# Patient Record
Sex: Female | Born: 1980 | Race: White | Hispanic: No | Smoking: Current every day smoker
Health system: Southern US, Community
[De-identification: ages and names within clinical notes are randomized; demographics above are authoritative.]

## PROBLEM LIST (undated history)

## (undated) DIAGNOSIS — T1491XA Suicide attempt, initial encounter: Secondary | ICD-10-CM

## (undated) DIAGNOSIS — M797 Fibromyalgia: Secondary | ICD-10-CM

## (undated) DIAGNOSIS — C801 Malignant (primary) neoplasm, unspecified: Secondary | ICD-10-CM

## (undated) DIAGNOSIS — R51 Headache: Secondary | ICD-10-CM

## (undated) DIAGNOSIS — D649 Anemia, unspecified: Secondary | ICD-10-CM

## (undated) DIAGNOSIS — F329 Major depressive disorder, single episode, unspecified: Secondary | ICD-10-CM

## (undated) DIAGNOSIS — F419 Anxiety disorder, unspecified: Secondary | ICD-10-CM

## (undated) DIAGNOSIS — G8929 Other chronic pain: Secondary | ICD-10-CM

## (undated) DIAGNOSIS — Z87442 Personal history of urinary calculi: Secondary | ICD-10-CM

## (undated) DIAGNOSIS — F32A Depression, unspecified: Secondary | ICD-10-CM

## (undated) DIAGNOSIS — R102 Pelvic and perineal pain: Secondary | ICD-10-CM

## (undated) DIAGNOSIS — M549 Dorsalgia, unspecified: Secondary | ICD-10-CM

## (undated) DIAGNOSIS — N3010 Interstitial cystitis (chronic) without hematuria: Secondary | ICD-10-CM

## (undated) DIAGNOSIS — N83209 Unspecified ovarian cyst, unspecified side: Secondary | ICD-10-CM

## (undated) HISTORY — PX: ABDOMINAL HYSTERECTOMY: SHX81

## (undated) HISTORY — PX: HERNIA REPAIR: SHX51

## (undated) HISTORY — DX: Depression, unspecified: F32.A

## (undated) HISTORY — DX: Interstitial cystitis (chronic) without hematuria: N30.10

## (undated) HISTORY — DX: Major depressive disorder, single episode, unspecified: F32.9

## (undated) HISTORY — DX: Anxiety disorder, unspecified: F41.9

---
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
## 2004-02-02 ENCOUNTER — Emergency Department (HOSPITAL_COMMUNITY): Admission: EM | Admit: 2004-02-02 | Discharge: 2004-02-03 | Payer: Self-pay | Admitting: Emergency Medicine

## 2004-10-16 ENCOUNTER — Emergency Department (HOSPITAL_COMMUNITY): Admission: EM | Admit: 2004-10-16 | Discharge: 2004-10-16 | Payer: Self-pay | Admitting: Emergency Medicine

## 2005-04-11 ENCOUNTER — Emergency Department (HOSPITAL_COMMUNITY): Admission: AD | Admit: 2005-04-11 | Discharge: 2005-04-11 | Payer: Self-pay | Admitting: Family Medicine

## 2005-07-01 ENCOUNTER — Ambulatory Visit: Payer: Self-pay | Admitting: Internal Medicine

## 2005-07-06 ENCOUNTER — Ambulatory Visit (HOSPITAL_COMMUNITY): Admission: RE | Admit: 2005-07-06 | Discharge: 2005-07-06 | Payer: Self-pay | Admitting: Internal Medicine

## 2005-07-29 ENCOUNTER — Emergency Department (HOSPITAL_COMMUNITY): Admission: EM | Admit: 2005-07-29 | Discharge: 2005-07-29 | Payer: Self-pay | Admitting: Family Medicine

## 2005-08-02 ENCOUNTER — Emergency Department (HOSPITAL_COMMUNITY): Admission: EM | Admit: 2005-08-02 | Discharge: 2005-08-02 | Payer: Self-pay | Admitting: Emergency Medicine

## 2005-08-10 ENCOUNTER — Encounter (INDEPENDENT_AMBULATORY_CARE_PROVIDER_SITE_OTHER): Payer: Self-pay | Admitting: *Deleted

## 2005-08-10 ENCOUNTER — Ambulatory Visit: Payer: Self-pay | Admitting: Obstetrics & Gynecology

## 2005-09-08 ENCOUNTER — Ambulatory Visit: Payer: Self-pay | Admitting: Obstetrics & Gynecology

## 2005-09-10 ENCOUNTER — Ambulatory Visit (HOSPITAL_COMMUNITY): Admission: RE | Admit: 2005-09-10 | Discharge: 2005-09-10 | Payer: Self-pay | Admitting: Obstetrics and Gynecology

## 2005-09-17 ENCOUNTER — Emergency Department (HOSPITAL_COMMUNITY): Admission: EM | Admit: 2005-09-17 | Discharge: 2005-09-17 | Payer: Self-pay | Admitting: Emergency Medicine

## 2005-09-17 ENCOUNTER — Ambulatory Visit: Payer: Self-pay | Admitting: Gynecology

## 2005-09-22 ENCOUNTER — Ambulatory Visit: Payer: Self-pay | Admitting: Gynecology

## 2005-10-07 ENCOUNTER — Encounter (INDEPENDENT_AMBULATORY_CARE_PROVIDER_SITE_OTHER): Payer: Self-pay | Admitting: Specialist

## 2005-10-07 ENCOUNTER — Emergency Department (HOSPITAL_COMMUNITY): Admission: EM | Admit: 2005-10-07 | Discharge: 2005-10-07 | Payer: Self-pay | Admitting: Emergency Medicine

## 2005-10-07 ENCOUNTER — Other Ambulatory Visit: Admission: RE | Admit: 2005-10-07 | Discharge: 2005-10-07 | Payer: Self-pay | Admitting: Obstetrics and Gynecology

## 2005-10-07 ENCOUNTER — Ambulatory Visit: Payer: Self-pay | Admitting: Obstetrics and Gynecology

## 2005-10-12 ENCOUNTER — Ambulatory Visit (HOSPITAL_COMMUNITY): Admission: RE | Admit: 2005-10-12 | Discharge: 2005-10-12 | Payer: Self-pay | Admitting: Obstetrics & Gynecology

## 2005-10-12 ENCOUNTER — Ambulatory Visit: Payer: Self-pay | Admitting: Obstetrics & Gynecology

## 2005-10-19 ENCOUNTER — Inpatient Hospital Stay (HOSPITAL_COMMUNITY): Admission: AD | Admit: 2005-10-19 | Discharge: 2005-10-19 | Payer: Self-pay | Admitting: Gynecology

## 2005-10-27 ENCOUNTER — Ambulatory Visit: Payer: Self-pay | Admitting: Obstetrics & Gynecology

## 2005-11-24 ENCOUNTER — Ambulatory Visit: Payer: Self-pay | Admitting: Obstetrics & Gynecology

## 2005-12-09 ENCOUNTER — Ambulatory Visit: Payer: Self-pay | Admitting: Obstetrics and Gynecology

## 2005-12-22 ENCOUNTER — Ambulatory Visit: Payer: Self-pay | Admitting: Obstetrics & Gynecology

## 2006-02-28 ENCOUNTER — Emergency Department (HOSPITAL_COMMUNITY): Admission: EM | Admit: 2006-02-28 | Discharge: 2006-02-28 | Payer: Self-pay | Admitting: Emergency Medicine

## 2006-03-23 ENCOUNTER — Encounter: Payer: Self-pay | Admitting: Obstetrics & Gynecology

## 2006-03-23 ENCOUNTER — Ambulatory Visit: Payer: Self-pay | Admitting: Obstetrics & Gynecology

## 2006-05-17 ENCOUNTER — Encounter (INDEPENDENT_AMBULATORY_CARE_PROVIDER_SITE_OTHER): Payer: Self-pay | Admitting: *Deleted

## 2006-05-17 ENCOUNTER — Ambulatory Visit: Payer: Self-pay | Admitting: Obstetrics & Gynecology

## 2006-05-17 ENCOUNTER — Inpatient Hospital Stay (HOSPITAL_COMMUNITY): Admission: RE | Admit: 2006-05-17 | Discharge: 2006-05-18 | Payer: Self-pay | Admitting: Obstetrics & Gynecology

## 2006-10-13 ENCOUNTER — Emergency Department (HOSPITAL_COMMUNITY): Admission: EM | Admit: 2006-10-13 | Discharge: 2006-10-13 | Payer: Self-pay | Admitting: Family Medicine

## 2006-10-19 ENCOUNTER — Ambulatory Visit: Payer: Self-pay | Admitting: Family Medicine

## 2007-01-22 ENCOUNTER — Emergency Department (HOSPITAL_COMMUNITY): Admission: EM | Admit: 2007-01-22 | Discharge: 2007-01-22 | Payer: Self-pay | Admitting: Family Medicine

## 2007-02-21 ENCOUNTER — Ambulatory Visit: Payer: Self-pay | Admitting: Internal Medicine

## 2007-02-21 DIAGNOSIS — F810 Specific reading disorder: Secondary | ICD-10-CM

## 2007-02-21 DIAGNOSIS — F411 Generalized anxiety disorder: Secondary | ICD-10-CM | POA: Insufficient documentation

## 2007-02-21 DIAGNOSIS — F329 Major depressive disorder, single episode, unspecified: Secondary | ICD-10-CM

## 2007-02-21 LAB — CONVERTED CEMR LAB
Eosinophils Absolute: 0 10*3/uL (ref 0.0–0.7)
Lymphocytes Relative: 34 % (ref 12–46)
Neutro Abs: 3.6 10*3/uL (ref 1.7–7.7)
Platelets: 195 10*3/uL (ref 150–400)
RBC: 4.88 M/uL (ref 3.87–5.11)
RDW: 12.5 % (ref 11.5–14.0)
WBC: 6 10*3/uL (ref 4.0–10.5)

## 2008-01-17 ENCOUNTER — Emergency Department (HOSPITAL_COMMUNITY): Admission: EM | Admit: 2008-01-17 | Discharge: 2008-01-18 | Payer: Self-pay | Admitting: Emergency Medicine

## 2008-06-24 ENCOUNTER — Encounter: Admission: RE | Admit: 2008-06-24 | Discharge: 2008-06-24 | Payer: Self-pay | Admitting: Family Medicine

## 2008-07-15 ENCOUNTER — Inpatient Hospital Stay (HOSPITAL_COMMUNITY): Admission: EM | Admit: 2008-07-15 | Discharge: 2008-07-17 | Payer: Self-pay | Admitting: Emergency Medicine

## 2008-07-17 ENCOUNTER — Inpatient Hospital Stay (HOSPITAL_COMMUNITY): Admission: AD | Admit: 2008-07-17 | Discharge: 2008-07-18 | Payer: Self-pay | Admitting: *Deleted

## 2008-07-17 ENCOUNTER — Ambulatory Visit: Payer: Self-pay | Admitting: *Deleted

## 2009-02-02 ENCOUNTER — Emergency Department (HOSPITAL_COMMUNITY): Admission: EM | Admit: 2009-02-02 | Discharge: 2009-02-02 | Payer: Self-pay | Admitting: Emergency Medicine

## 2009-04-25 ENCOUNTER — Emergency Department (HOSPITAL_COMMUNITY): Admission: EM | Admit: 2009-04-25 | Discharge: 2009-04-26 | Payer: Self-pay | Admitting: Emergency Medicine

## 2009-05-08 ENCOUNTER — Emergency Department (HOSPITAL_COMMUNITY): Admission: EM | Admit: 2009-05-08 | Discharge: 2009-05-08 | Payer: Self-pay | Admitting: Family Medicine

## 2009-07-01 ENCOUNTER — Emergency Department (HOSPITAL_COMMUNITY): Admission: EM | Admit: 2009-07-01 | Discharge: 2009-07-02 | Payer: Self-pay | Admitting: Emergency Medicine

## 2009-08-26 ENCOUNTER — Emergency Department (HOSPITAL_COMMUNITY): Admission: EM | Admit: 2009-08-26 | Discharge: 2009-08-26 | Payer: Self-pay | Admitting: Family Medicine

## 2009-09-29 ENCOUNTER — Emergency Department (HOSPITAL_COMMUNITY): Admission: EM | Admit: 2009-09-29 | Discharge: 2009-09-30 | Payer: Self-pay | Admitting: Emergency Medicine

## 2010-03-18 ENCOUNTER — Emergency Department (HOSPITAL_COMMUNITY): Admission: EM | Admit: 2010-03-18 | Discharge: 2010-03-18 | Payer: Self-pay | Admitting: Emergency Medicine

## 2010-09-13 LAB — BASIC METABOLIC PANEL
Calcium: 8.8 mg/dL (ref 8.4–10.5)
Creatinine, Ser: 0.6 mg/dL (ref 0.4–1.2)
GFR calc Af Amer: >60 mL/min (ref >60)
Potassium: 3.6 mEq/L (ref 3.5–5.1)

## 2010-09-13 LAB — POCT PREGNANCY, URINE: Preg Test, Ur: NEGATIVE

## 2010-09-13 LAB — URINALYSIS, ROUTINE W REFLEX MICROSCOPIC
Bilirubin Urine: NEGATIVE
Nitrite: NEGATIVE
Protein, ur: NEGATIVE mg/dL
Urobilinogen, UA: 0.2 mg/dL (ref 0.0–1.0)
pH: 7.5 (ref 5.0–8.0)

## 2010-09-13 LAB — DIFFERENTIAL
Eosinophils Relative: 1 % (ref 0–5)
Lymphocytes Relative: 29 % (ref 12–46)
Lymphs Abs: 1.5 10*3/uL (ref 0.7–4.0)
Monocytes Relative: 7 % (ref 3–12)

## 2010-09-13 LAB — CBC
MCHC: 33.8 g/dL (ref 30.0–36.0)
MCV: 92.3 fL (ref 78.0–100.0)
Platelets: 155 10*3/uL (ref 150–400)
RDW: 13.2 % (ref 11.5–15.5)

## 2010-10-03 LAB — POCT URINALYSIS DIP (DEVICE)
Bilirubin Urine: NEGATIVE
Glucose, UA: NEGATIVE mg/dL
Hgb urine dipstick: NEGATIVE
Ketones, ur: NEGATIVE mg/dL
Specific Gravity, Urine: 1.015 (ref 1.005–1.030)

## 2010-10-03 LAB — COMPREHENSIVE METABOLIC PANEL
AST: 20 U/L (ref 0–37)
Albumin: 4 g/dL (ref 3.5–5.2)
Alkaline Phosphatase: 46 U/L (ref 39–117)
CO2: 25 mEq/L (ref 19–32)
Chloride: 104 mEq/L (ref 96–112)
Creatinine, Ser: 0.63 mg/dL (ref 0.4–1.2)
GFR calc Af Amer: >60 mL/min (ref >60)
GFR calc non Af Amer: >60 mL/min (ref >60)
Potassium: 3.8 mEq/L (ref 3.5–5.1)
Total Bilirubin: 1 mg/dL (ref 0.3–1.2)

## 2010-10-03 LAB — CBC
HCT: 42 % (ref 36.0–46.0)
MCV: 90.5 fL (ref 78.0–100.0)
Platelets: 161 10*3/uL (ref 150–400)
RBC: 4.65 MIL/uL (ref 3.87–5.11)
WBC: 7 10*3/uL (ref 4.0–10.5)

## 2010-10-12 LAB — COMPREHENSIVE METABOLIC PANEL
ALT: 13 U/L (ref 0–35)
AST: 15 U/L (ref 0–37)
AST: 16 U/L (ref 0–37)
Albumin: 2.8 g/dL — ABNORMAL LOW (ref 3.5–5.2)
Albumin: 3.9 g/dL (ref 3.5–5.2)
Alkaline Phosphatase: 38 U/L — ABNORMAL LOW (ref 39–117)
Alkaline Phosphatase: 40 U/L (ref 39–117)
BUN: 4 mg/dL — ABNORMAL LOW (ref 6–23)
CO2: 23 mEq/L (ref 19–32)
CO2: 24 mEq/L (ref 19–32)
Calcium: 8.2 mg/dL — ABNORMAL LOW (ref 8.4–10.5)
Chloride: 106 mEq/L (ref 96–112)
Chloride: 109 mEq/L (ref 96–112)
Chloride: 114 mEq/L — ABNORMAL HIGH (ref 96–112)
Creatinine, Ser: 0.62 mg/dL (ref 0.4–1.2)
Creatinine, Ser: 0.68 mg/dL (ref 0.4–1.2)
GFR calc Af Amer: >60 mL/min (ref >60)
GFR calc Af Amer: >60 mL/min (ref >60)
GFR calc Af Amer: >60 mL/min (ref >60)
GFR calc non Af Amer: >60 mL/min (ref >60)
GFR calc non Af Amer: >60 mL/min (ref >60)
Glucose, Bld: 87 mg/dL (ref 70–99)
Glucose, Bld: 91 mg/dL (ref 70–99)
Potassium: 3.4 mEq/L — ABNORMAL LOW (ref 3.5–5.1)
Potassium: 3.5 mEq/L (ref 3.5–5.1)
Potassium: 4.3 mEq/L (ref 3.5–5.1)
Sodium: 138 mEq/L (ref 135–145)
Sodium: 139 mEq/L (ref 135–145)
Sodium: 140 mEq/L (ref 135–145)
Total Bilirubin: 0.7 mg/dL (ref 0.3–1.2)
Total Bilirubin: 0.7 mg/dL (ref 0.3–1.2)
Total Bilirubin: 0.8 mg/dL (ref 0.3–1.2)
Total Protein: 4.5 g/dL — ABNORMAL LOW (ref 6.0–8.3)
Total Protein: 4.8 g/dL — ABNORMAL LOW (ref 6.0–8.3)
Total Protein: 5.1 g/dL — ABNORMAL LOW (ref 6.0–8.3)
Total Protein: 6 g/dL (ref 6.0–8.3)

## 2010-10-12 LAB — CBC
HCT: 36 % (ref 36.0–46.0)
HCT: 41.6 % (ref 36.0–46.0)
Hemoglobin: 11.9 g/dL — ABNORMAL LOW (ref 12.0–15.0)
Hemoglobin: 12.1 g/dL (ref 12.0–15.0)
Hemoglobin: 14.1 g/dL (ref 12.0–15.0)
MCHC: 33.7 g/dL (ref 30.0–36.0)
MCHC: 33.9 g/dL (ref 30.0–36.0)
MCV: 91.6 fL (ref 78.0–100.0)
MCV: 92.6 fL (ref 78.0–100.0)
Platelets: 132 10*3/uL — ABNORMAL LOW (ref 150–400)
Platelets: 167 10*3/uL (ref 150–400)
RBC: 3.82 MIL/uL — ABNORMAL LOW (ref 3.87–5.11)
RBC: 3.93 MIL/uL (ref 3.87–5.11)
RBC: 4.54 MIL/uL (ref 3.87–5.11)
RDW: 13.1 % (ref 11.5–15.5)
RDW: 13.1 % (ref 11.5–15.5)
RDW: 13.3 % (ref 11.5–15.5)
WBC: 4 10*3/uL (ref 4.0–10.5)
WBC: 4.6 10*3/uL (ref 4.0–10.5)
WBC: 6 10*3/uL (ref 4.0–10.5)

## 2010-10-12 LAB — DIFFERENTIAL
Basophils Absolute: 0 10*3/uL (ref 0.0–0.1)
Basophils Absolute: 0 10*3/uL (ref 0.0–0.1)
Basophils Relative: 0 % (ref 0–1)
Basophils Relative: 1 % (ref 0–1)
Basophils Relative: 1 % (ref 0–1)
Eosinophils Absolute: 0.1 10*3/uL (ref 0.0–0.7)
Eosinophils Absolute: 0.1 K/uL (ref 0.0–0.7)
Eosinophils Relative: 1 % (ref 0–5)
Eosinophils Relative: 1 % (ref 0–5)
Eosinophils Relative: 1 % (ref 0–5)
Lymphocytes Relative: 34 % (ref 12–46)
Lymphs Abs: 1.6 10*3/uL (ref 0.7–4.0)
Lymphs Abs: 2 10*3/uL (ref 0.7–4.0)
Monocytes Absolute: 0.3 10*3/uL (ref 0.1–1.0)
Monocytes Absolute: 0.3 10*3/uL (ref 0.1–1.0)
Monocytes Absolute: 0.4 10*3/uL (ref 0.1–1.0)
Monocytes Relative: 6 % (ref 3–12)
Monocytes Relative: 6 % (ref 3–12)
Monocytes Relative: 8 % (ref 3–12)
Neutro Abs: 2.6 10*3/uL (ref 1.7–7.7)
Neutro Abs: 3.5 10*3/uL (ref 1.7–7.7)
Neutrophils Relative %: 51 % (ref 43–77)
Neutrophils Relative %: 59 % (ref 43–77)

## 2010-10-12 LAB — RAPID URINE DRUG SCREEN, HOSP PERFORMED
Amphetamines: NOT DETECTED
Barbiturates: NOT DETECTED
Benzodiazepines: NOT DETECTED
Cocaine: NOT DETECTED
Opiates: NOT DETECTED
Tetrahydrocannabinol: POSITIVE — AB

## 2010-10-12 LAB — SALICYLATE LEVEL: Salicylate Lvl: <4 mg/dL (ref 2.8–20.0)

## 2010-10-12 LAB — COMPREHENSIVE METABOLIC PANEL WITH GFR
ALT: 11 U/L (ref 0–35)
Alkaline Phosphatase: 46 U/L (ref 39–117)
BUN: 10 mg/dL (ref 6–23)
CO2: 27 meq/L (ref 19–32)
Calcium: 9.2 mg/dL (ref 8.4–10.5)
GFR calc non Af Amer: >60 mL/min (ref >60)
Glucose, Bld: 106 mg/dL — ABNORMAL HIGH (ref 70–99)
Sodium: 139 meq/L (ref 135–145)

## 2010-10-12 LAB — URINALYSIS, ROUTINE W REFLEX MICROSCOPIC
Nitrite: NEGATIVE
Specific Gravity, Urine: 1.002 — ABNORMAL LOW (ref 1.005–1.030)
Urobilinogen, UA: 1 mg/dL (ref 0.0–1.0)
pH: 7 (ref 5.0–8.0)

## 2010-10-12 LAB — ETHANOL: Alcohol, Ethyl (B): <5 mg/dL (ref 0–10)

## 2010-10-12 LAB — MAGNESIUM: Magnesium: 2 mg/dL (ref 1.5–2.5)

## 2010-10-12 LAB — PREGNANCY, URINE: Preg Test, Ur: NEGATIVE

## 2010-10-12 LAB — ACETAMINOPHEN LEVEL: Acetaminophen (Tylenol), Serum: <10 ug/mL — ABNORMAL LOW (ref 10–30)
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
## 2010-11-10 NOTE — Consult Note (Signed)
Susan Gross, Susan Gross              ACCOUNT NO.:  [id number]   MEDICAL RECORD NO.:  [id number]          PATIENT TYPE:  INP   LOCATION:  0199                         FACILITY:  [facility]   PHYSICIAN:  Antonietta Breach, M.D.  DATE OF BIRTH:  22-Jun-1981   DATE OF CONSULTATION:  07/16/2008  DATE OF DISCHARGE:                                 CONSULTATION   ADDENDUM   Will defer an antipsychotic at this time.  However, if she continues  with the hallucinations, she will require antipsychotic therapy.   Would be cautious about any nausea or sedation with the trazodone.   Also, the patient is experiencing some Cymbalta withdrawal with muscle  aches and unusual feeling on edge.  Will therefore start a low dosage of  Cymbalta 20 mg daily to be tapered off or potentially continued with  augmentation strategy once she reaches the psychiatric ward.      Antonietta Breach, M.D.  Electronically Signed     JW/MEDQ  D:  07/16/2008  T:  07/16/2008  Job:  [id number]

## 2010-11-10 NOTE — H&P (Signed)
NAMEBRITANIA, Gross              ACCOUNT NO.:  [id number]   MEDICAL RECORD NO.:  [id number]          PATIENT TYPE:  INP   LOCATION:  0101                         FACILITY:  [facility]   PHYSICIAN:  Manus Gunning, MD      DATE OF BIRTH:  1981/02/27   DATE OF ADMISSION:  07/15/2008  DATE OF DISCHARGE:                              HISTORY & PHYSICAL   CHIEF COMPLAINT:  Suicide attempt with benzodiazepines; dose and amount  unknown.   HISTORY OF PRESENT ILLNESS:  Ms. Susan Gross is a 30 year old Caucasian  female who supposedly was in an argument with her husband and decided to  overdose on benzodiazepines in an attempt to commit suicide.  Apparently, she has done this in the past per the ED physician signing  out.  The patient herself is somnolent and very difficult to arouse  during my interview.  She is hemodynamically stable with borderline  blood pressures in the 90s to the low 100s.  She is saturating greater  than 92% on 2 liters nasal cannula at the time of my interview.  No  history was able to be obtained from the patient, and there is no family  member at her bedside.  Therefore, history has essentially been obtained  from the ED physician signing out, as well as searching through past  records on the computer.  While she was in the ED, she obtained  approximately half a dose of charcoal, as she vomited the other half  out.  She took her medications approximately around midnight and  presented to the emergency department around 1 o'clock in the morning.   PAST MEDICAL AND PAST SURGICAL HISTORY:  1. Endometriosis.  2. Status post hysterectomy.  3. Suicidal attempts, multiple.  4. Fibromyalgia.  5. Nephrolithiasis.  6. Herniorrhaphy.   ALLERGIES:  1. CIPRO.  2. VICODIN.   SOCIAL HISTORY:  No history of alcohol use.  Positive  tetrahydrocannabinols on urine drug screen.  Positive tobacco use.   HOME MEDICATIONS:  1. Lyrica.  2. Clonazepam.  3. Elmiron.  (Doses of  medications and frequency unknown.)   REVIEW OF SYSTEMS:  A 14-point review of systems was attempted but  unfortunately cannot be obtained secondary to patient's presenting  condition.   PHYSICAL EXAMINATION:  VITAL SIGNS:  The vitals at the time of  presentation revealed a temperature of 98.5, heart rate 73, respiratory  rate 16, blood pressure 114/71, O2 saturation 100% on room air.  GENERAL:  Well-nourished, well-developed Caucasian female who was asleep  on the bed in no apparent distress.  HEENT:  Normocephalic and atraumatic.  Positive gag reflex.  Moist oral  mucosa, no thrush.  Eyes anicteric.  Extraocular muscles cannot be  assessed.  Pupils equal and reactive to light and accommodation.  CARDIOVASCULAR:  S1, S2 normal.  Regular rate and rhythm.  No murmurs,  rubs, or gallops.  RESPIRATORY:  Air entry  bilaterally equal.  No rales, rhonchi or  wheezes appreciated.  ABDOMEN:  Soft, nontender, nondistended.  Positive bowel sounds.  No  hepatomegaly.  EXTREMITIES:  No cyanosis, no clubbing, no edema.  Positive  bilateral  dorsalis pedis.  CNS:  Alert and oriented.  Cranial nerves II-XII grossly intact.  Deep  tendon reflexes are bilaterally symmetrical.  HEM/ONC: No palpable lymphadenopathy.  No ecchymosis or petechiae.  No  hepatosplenomegaly.  SKIN:  No breakdown, swelling ulcerations or masses.  NECK:  Supple.  Good range of motion.  No thyromegaly.  No carotid  bruits.   LABORATORY TESTS:  WBC 6000, hemoglobin 14.1, hematocrit 41.6, platelet  count 167, polymorphs 59.  Sodium 139, potassium 3.4, chloride 106, CO2  of 27, glucose 106, BUN 10, creatinine 0.6, total bilirubin 0.7,  alkaline phosphatase 46, AST 16, ALT 11, total protein 6, albumin 3.9,  calcium 9.2.  Betadine hCG negative.  Acetaminophen less than 10.  Salicylate less than 4.  Tetrahydrocannabinols positive.  Benzodiazepines - none detected.  Urinalysis negative for protein,  ketones, blood, bilirubin,  glucose, nitrate or leukocyte esterase.   ASSESSMENT AND PLAN:  Suicide attempt.  At this time, medication  unknown; supposedly took benzodiazepines and clonazepam based on her  home medication list per ED sign out, but urine drug screen is negative.  She received activated charcoal in the emergency department and is  currently hemodynamically stable.  Therefore, will closely monitor,  obtain a.m. labs, CBC, differential and BNP and reassess in a.m.  Place  on telemetry with close monitoring.  Continue nasal cannula to keep O2  saturation greater than 90%.  Prophylaxis with Lovenox 40 mg  subcutaneous daily and Protonix 40 mg p.o. daily for GI and DVT  prophylaxis and Tylenol 650 mg q.4h. p.r.n. mild to moderate pain.  Will  need a psychiatric consult in the morning and also place with one-on-one  sitter.      Manus Gunning, MD  Electronically Signed     SP/MEDQ  D:  07/15/2008  T:  07/15/2008  Job:  [phone]

## 2010-11-10 NOTE — Discharge Summary (Signed)
Susan Gross, Susan Gross              ACCOUNT NO.:  [id number]   MEDICAL RECORD NO.:  [id number]          PATIENT TYPE:  INP   LOCATION:  0199                         FACILITY:  [facility]   PHYSICIAN:  Theodosia Paling, MD    DATE OF BIRTH:  [birth year]   DATE OF ADMISSION:  07/15/2008  DATE OF DISCHARGE:  07/17/2008                               DISCHARGE SUMMARY   PRIMARY CARE PHYSICIAN:  Patient does not have a primary care physician  at this time.   ADMITTING HISTORY:  Please refer to the excellent admission note  dictated by Dr. Manus Gunning and the history of present illness as  dictated on July 15, 2008.   DISCHARGE DIAGNOSES:  1. Drug overdose.  2. Suicide attempt secondary to depression.  3. Fibromyalgia.   DISCHARGE MEDICATIONS:  1. Trazodone 25 mg p.o. nightly p.r.n.  2. Cymbalta 20 grams p.o. daily.  3. Ibuprofen 800 mg p.o. q.8 hours for 4 days.  4. Lyrica 25 mg p.o. q.12 h.  5. Protonix 40 mg p.o. q.12 h.   HOSPITAL COURSE:  The following issues were addressed during the  hospitalization:  1. Benzodiazepine overdose.  Patient was sedated at the time of      evaluation.  She did not have any cardiorespiratory adverse effects      from benzodiazepine overdose.  2. Depression and suicidal ideation.  Dr. Antonietta Breach was      consulted for further evaluation for accepted the patient to      behavioral medicine for further management and under behavioral      medicine.  Highly appreciate his help in this regard.  3. Fibromyalgia.  Cymbalta was continued along with Lyrica.   DISPOSITION:  Patient is getting transferred to [facility] for  further evaluation and management.   Total time spent in discharge of this patient, transfer of this patient:  Forty minutes.      Theodosia Paling, MD  Electronically Signed     NP/MEDQ  D:  07/17/2008  T:  07/17/2008  Job:  [phone]
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
## 2010-11-13 NOTE — Group Therapy Note (Signed)
NAME:  Susan, Gross NO.:  [id number]   MEDICAL RECORD NO.:  [id number]          PATIENT TYPE:  WOC   LOCATION:  [facility]                   FACILITY:  [facility]   PHYSICIAN:  Elsie Lincoln, MD      DATE OF BIRTH:  12/18/1980   DATE OF SERVICE:                                    CLINIC NOTE   Patient is a 30 year old female, G3, P2-0-1-2, last menstrual period is many  months ago as the patient has a Mirena IUD in place, which can cause  amenorrhea.  The patient was here for multiple complaints mostly involving  pelvic pain, back pain, dyspareunia and lots of problems with urination.  The patient has incontinence when she coughs or sneezes and she also has  incontinence when she has an urge to void, and when she does void only  little dribbles come out and she still has a sense that her bladder is not  empty.  She urinates approximately 30 times a day.  This pain in her pelvis  has been going on since 2004 ever since she had a STAT C-section for her  second child.  The pain is better when she is supine and worse with  movement, urination and sex.  She is also having some new sharp pains to the  perineum, and she also problems with her legs going numb.   PAST MEDICAL HISTORY:  Depression.   PAST GYNECOLOGICAL HISTORY:  NSVD x1, C-section x1, miscarriage with a  subsequent D&C x1.  The patient has a Mirena IUD in place since 2004.  No  history of sexually transmitted diseases and no abnormal Pap smears.   PAST SURGICAL HISTORY:  Hernia repair.   SOCIAL HISTORY:  One-half pack per day smoker, no drugs or alcohol.  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  She is married in a  monogamous relationship.   FAMILY HISTORY:  No history of familial cancers.  Dad died at age 47 from  acute myocardial infarction.   REVIEW OF SYMPTOMS:  Positive in most symptoms and most of them are GU.  She  also has problems with weight loss and fatigue.   PHYSICAL  EXAMINATION:  VITAL SIGNS:  Temperature 99, pulse 78, blood  pressure 122/72 and weight 96.6.  GENERAL:  A thin, slightly depressed appearing female in no apparent  distress.  ABDOMEN:  Soft, nontender and nondistended.  No rebound, no guarding and no  hernias.  GENITALIA:  Tanner V.  Wet Q-tip test on the vulva revealed no pain at the  introitus or on the perineum.  Vulvodynia not a problem.  Urethra mildly  tender.  Bladder feels enlarged and tender.  Vagina pink.  Normal rugae.  Cervix closed.  Uterus tender to deep palpation.  Adnexa, no masses, but  just diffusely tender.  Again, most of the pain seems to be anterior over  the bladder.   ASSESSMENT/PLAN:  A 30 year old female with chronic pelvic pain and overflow  incontinence and inability to void.  1.  Post void residual revealed 500 mL.  A UA, microscopy and a urine  culture sent.  2.  Referral made to Bjorn Pippin, M.D.  The patient has an appointment on      February 15 at 1:00.  3.  Pap smear done.  4.  Return to clinic in 4 weeks after urological complaints are addressed      and at that point, we will investigate more etiologies of chronic pelvic      pain.           ______________________________  Elsie Lincoln, MD     KL/MEDQ  D:  08/10/2005  T:  08/10/2005  Job:  [id number]

## 2010-11-13 NOTE — Group Therapy Note (Signed)
NAME:  Susan Gross, FUJIKAWA NO.:  [id number]   MEDICAL RECORD NO.:  [id number]          PATIENT TYPE:  WOC   LOCATION:  [facility]                   FACILITY:  [facility]   PHYSICIAN:  Elsie Lincoln, MD      DATE OF BIRTH:  1980/08/11   DATE OF SERVICE:                                    CLINIC NOTE   The patient is a 30 year old female, who is here for followup of pelvic  pain.  She was seen by the urologist recently and he thinks that I may have  blocked any stone in the urethra that was causing her overflow incontinence.  The patient has been worse with pain and has been taking her mother's  Demerol, and she really wants something done for this pain.  She has 2 small  ovarian cysts noted in an early January ultrasound.  So, we are going to  repeat those today, ordered that today.  She also is very skinny.  She is  less than 100 pounds and I am somewhat worried about anorexia, but I will  draw a TSH today.  Also today, she further tells [place] that she has been  committed to [facility] for suicidal ideation and also that she has been  on multiple meds for depression, but however, she has recently been on  Klonopin and Xanax.  So, I am slightly confused about what her true mental  problems are.  She denies suicidal ideation today.  Also of note, she has an  ASCUS Pap with a high-risk HPV and is scheduled for a colposcopy October 07, 2005.   PHYSICAL EXAMINATION:  VITAL SIGNS:  Temperature 99.8, pulse 104, blood  pressure 121/95, weight 97, height 5 feet 4 inches.  GENERAL:  Frail, visibly upset.  ABDOMEN:  Soft, mildly tender in lower quadrants, no rebound and no  guarding.  No tenderness of her bladder, no fullness over her bladder and  urethra nontender.  GENITALIA:  Tanner V.  Cervix slightly tender, pain to deep palpation on the  left, greater than on the right and no masses felt.   ASSESSMENT/PLAN:  A 30 year old female with chronic pelvic pain with  resolved  urological overflow incontinence.  1.  Demerol for pain.  The patient needs a urine drug screen next visit to      make sure that she is actually taking these pills.  2.  Ultrasound scheduled for followup ovarian cyst.  3.  Behavioral health referral.  4.  Schedule for diagnostic laparoscopy.           ______________________________  Elsie Lincoln, MD     KL/MEDQ  D:  09/08/2005  T:  09/09/2005  Job:  [id number]

## 2010-11-13 NOTE — Group Therapy Note (Signed)
NAME:  Susan Gross, Susan Gross NO.:  [id number]   MEDICAL RECORD NO.:  [id number]          PATIENT TYPE:  WOC   LOCATION:  [facility]                   FACILITY:  [facility]   PHYSICIAN:  Argentina Donovan, MD        DATE OF BIRTH:  1981-05-18   DATE OF SERVICE:  12/09/2005                                    CLINIC NOTE   HISTORY OF PRESENT ILLNESS:  The patient is a 30 year old white female who  has been followed by Dr. Penne Lash with laparoscopy, etc., for chronic pelvic  pain.  She was found to have endometriosis, although mild.  She was treated  with Lupron in May of this year, 11.25.  She has gotten really no relief  from that.  She has an appointment in mid-July with Dr. Genevive Bi at [facility].  She  has been evaluated by a urologist, with no significant findings and has been  taking Ultram for mild pain and Percocet when it got really bad.  She has  been unable to have any sexual activity in over a year.  She has an IUD  which Dr. Penne Lash wanted removed.  We removed the patient's IUD.  We will  renew her Percocet prescription and hope that she gets some relief after  seeing Dr. Genevive Bi.   IMPRESSION:  1.  Chronic pelvic pain.  2.  Endometriosis.           ______________________________  Argentina Donovan, MD     PR/MEDQ  D:  12/09/2005  T:  12/09/2005  Job:  [id number]

## 2010-11-13 NOTE — Group Therapy Note (Signed)
NAME:  Susan Gross, Susan Gross NO.:  [id number]   MEDICAL RECORD NO.:  [id number]          PATIENT TYPE:  WOC   [facility]ATION[facility]                   [facility]   PHYSICIAN:  Elsie Lincoln, MD      DATE OF BIRTH:  12-Jul-1980   DATE OF SERVICE:                                    CLINIC NOTE   HISTORY OF PRESENT ILLNESS:  The patient is a 30 year old female who is here  for a two week follow up for surgery.  The patient had diagnostic  laparoscopy two weeks ago.  She was found to have mild endometriosis on her  uterosacral ligaments. There are photographs under the hospital tab of this  chart.  The patient states she is doing better today.  She was started on  continuous OCT's.  She was also taking Ultram and Percocet for pain p.r.n.  Her incisions are well-healed.  I did cut out a small knot at the  __________her umbilical incision.  Other good news is that she has decided  to quit smoking.  We started her on Chantix to start this 1/2 mg p.o. days  #1-#3, then 1/2 mg p.o. b.i.d. day #4-#7, then 1 mg p.o. b.i.d. for up to 12  weeks.  The patient is to start the medication a week before quits smoking.  I refilled her Ultram and Percocet, and the patient is to come back in 12  weeks and see how she is doing with the pelvic pain and her smoking  cessation.  She is also a candidate for Lupron, and if this does not work,  we could send her to [facility].           ______________________________  Elsie Lincoln, MD     KL/MEDQ  D:  10/27/2005  T:  10/28/2005  Job:  [id number]

## 2010-11-13 NOTE — Group Therapy Note (Signed)
NAME:  Susan Gross, Susan Gross NO.:  [id number]   MEDICAL RECORD NO.:  [id number]          PATIENT TYPE:  WOC   [facility]ATION[facility]                   [facility]   PHYSICIAN:  Ginger Carne, MD DATE OF BIRTH:  22-Mar-1981   DATE OF SERVICE:                                    CLINIC NOTE   This patient is a 30 year old Caucasian female returning today complaining  of fever.  She states she has been on antibiotics for a urinary tract  infection.  However, it is unclear what she has been taking in the past.  Said medication has not yet been called in by this office.  She states that  on March 21 she had a temperature of 102.3 and an elevation on March 22 of  100.3.  She had been on Demerol for pelvic pain.   LABORATORY:  Urinalysis demonstrates leukocytes.  Urine drug screen is  pending.   IMPRESSION:  Urinary tract infection.   PLAN:  The patient declined Cipro as previously recommended by Dr. Penne Lash  because she states it causes nausea and declined Pyridium .  She states she  has an over the counter medication for urinary burning.  She was, therefore,  prescribed Macrobid 100 mg twice a day with food for 10 days.  She is  scheduled for a diagnostic laparoscopy for pelvic pain by Dr. Penne Lash on  March 17 and a colonoscopy on March 14 as well, but the patient had  requested to have more pain medication and I advised to the patient that the  best course at this time would be for her to utilized ibuprofen to be taken  as needed and that following her surgery further recommendations based on  pathology will be made available.           ______________________________  Ginger Carne, MD     SHB/MEDQ  D:  09/17/2005  T:  09/18/2005  Job:  [id number]

## 2010-11-13 NOTE — Group Therapy Note (Signed)
NAME:  Susan, Gross NO.:  [id number]   MEDICAL RECORD NO.:  [id number]          PATIENT TYPE:  WOC   [facility]ATION[facility]                   [facility]   PHYSICIAN:  Elsie Lincoln, MD      DATE OF BIRTH:  [birth year]   DATE OF SERVICE:                                    CLINIC NOTE   The patient is a 30 year old female with chronic pelvic pain who has been  seen at [facility] several times.  They believe she has got mostly  musculoskeletal in nature.  She has been to physical therapy, which has not  helped.  Dr. __________ finally saw the patient and believed that it would  help from a laparoscopic uterine suspension and excision of cul-de-sac  endometriosis.  However, she does not want this procedure, she just wants a  simple hysterectomy without BSO.  She would rather have it done here in  [place] where she knows me, and not at [facility] where she does not  know her doctors.  I agreed that I would do this.  She is sure she does not  want any more children, and I do believe her.  She does have mild  endometriosis in the cul-de-sac, a little bit on the uterosacral ligaments,  but it would not prohibit [place] from doing a vaginal hysterectomy easily.  She  does have a low-grade cervical lesion on biopsy, and I repeated the Pap  smear today.  The patient is coming off Lupron and is having hot flashes,  but they are improving.  I did give her Percocet to bridge to surgery, and  hopefully she will no longer need pain medication after surgery; however, I  do feel like she may have some addiction issues.  We will schedule the  patient for total vaginal hysterectomy hopefully in the next 1-2 months.  The patient understands the risk of the procedure, bleeding, infection,  damage to the intra-abdominal organs, most likely to the bowel and the  bladder.           ______________________________  Elsie Lincoln, MD     KL/MEDQ  D:  03/23/2006  T:  03/25/2006   Job:  [id number]

## 2010-11-13 NOTE — Group Therapy Note (Signed)
NAME:  Susan Gross, Susan Gross NO.:  [id number]   MEDICAL RECORD NO.:  [id number]          PATIENT TYPE:  WOC   [facility]ATION[facility]                   [facility]   PHYSICIAN:  Elsie Lincoln, MD      DATE OF BIRTH:  Nov 16, 1980   DATE OF SERVICE:  11/24/2005                                    CLINIC NOTE   The patient is a 30 year old female who presents for increasing pelvic pain.  The patient has mild endometriosis on laparoscopy and has been managed with  an Mirena IUD as well as OCPs. The patient has been having increased pain,  has actually missed work for the past several days and would like something  else done. I talked to her about medical menopause with Lupron and she  thought this would be a good option. If this gets rid of her pain then we  can be more certain that her pain is only caused by endometriosis.  If this  does not get rid of her pain I am unsure of the etiology. She has already  seen a urologist who does not really think it is a urinary etiology. She did  have hypertension at one time, but this seems to be resolved. I think she  needs another trip down to [facility] to Dr. Riley Kill and his cohorts, if  this Lupron does not work.   PHYSICAL EXAMINATION:  VITAL SIGNS: Temperature 99.3, pulse 92, blood  pressure 120/79, weight 97.6 which is a 2-pound weight gain, height 5 feet 2  inches.  GENERAL: A slightly underweight female, slightly tearful.  ABDOMEN: Soft, nontender, nondistended. No rebound or guarding. Umbilical  incision well healed from previous laparoscopy.  GENITALIA: Tanner 5. Vagina pink, normal rugae. No malodorous discharge.  Urethral tender. Bladder tender. Uterus mobile and tender. Adnexa tender. No  point more tender than the other. No rebound or guarding.   ASSESSMENT/PLAN:  A 30 year old female with chronic pelvic pain.  1.  Check UA and urine culture today.  2.  Lupron 11.25 mg IM.  3.  Make an appointment with [facility].  4.  Return to clinic in three weeks for IUD removal to see if this helps      some of her uterine pain.           ______________________________  Elsie Lincoln, MD     KL/MEDQ  D:  11/24/2005  T:  11/24/2005  Job:  [id number]

## 2010-11-13 NOTE — Op Note (Signed)
NAME:  Susan Gross, Susan Gross              ACCOUNT NO.:  [id number]   MEDICAL RECORD NO.:  [id number]          PATIENT TYPE:  AMB   LOCATION:  [facility]                           [facility]   PHYSICIAN:  Lesly Dukes, M.D. DATE OF BIRTH:  1981/06/25   DATE OF PROCEDURE:  05/17/2006  DATE OF DISCHARGE:                                 OPERATIVE REPORT   PREOPERATIVE DIAGNOSES:  72. A 30 year old female, para 2 with chronic pelvic pain.  2. Failed all conservative methods.  3. Probable spotty endometriosis.  4. Desires hysterectomy.   POSTOPERATIVE DIAGNOSES:  21. A 30 year old female, para 2 with chronic pelvic pain.  2. Failed all conservative methods.  3. Probable spotty endometriosis.  4. Desires hysterectomy.  5. Definite endometriosis.   PROCEDURE:  Transvaginal hysterectomy.   SURGEON:  Lesly Dukes, M.D.   ASSISTANT:  Ginger Carne, MD   ANESTHESIA:  General anesthesia.   SPECIMENS:  Uterus.   ESTIMATED BLOOD LOSS:  200 mL.   COMPLICATIONS:  None.   FINDINGS:  Normal size anteverted uterus with implant of endometriosis in  the posterior cul-de-sac and some implants of endometriosis in the posterior  uterine wall, grossly normal ovaries bilaterally, grossly normal uterus  otherwise.   DESCRIPTION OF PROCEDURE:  After informed consent was obtained, the patient  was taken to the operating room where general anesthesia was induced.  The  patient was placed in the dorsal lithotomy position, prepped and draped in a  normal sterile fashion.  A Foley was placed in the bladder.  Retractors  placed into the vagina and Pitressin was infiltrated approximately 17 mL  into the cervix.  The cervix was circumferentially inscribed with a scalpel  down to the endopelvic fascia.  The peritoneal cavity was entered anteriorly  and posteriorly with the Metzenbaum scissors.  Retractors were placed into  the peritoneal cavity.  The uterosacral ligament and cardinal ligaments were  then clamped, transected and suture ligated with 0 Vicryl.  The uterine  artery was then clamped, transected and suture ligated with 0 Vicryl.  The  broad ligaments were then clamped, transected and suture ligated with 0  Vicryl.  Good hemostasis was noted.  The utero-ovarian ligaments were then  clamped, transected and suture ligated with 0 Vicryl with good hemostasis.  At this point the uterus was then free and passed off to the surgical tech  and sent to pathology.  Inspection of all the pedicles noted good  hemostasis.  The ovaries were noted to be grossly normal.  The peritoneum  and vaginal cuff were then baseball stitched posteriorly for good hemostasis  and then the vaginal cuff was closed transversely with 0 Vicryl in a running  locked fashion.  Vaginal packing with Clindamycin cream was then used at the  end of the procedure.  A Foley was still in the bladder.  The patient  tolerated the procedure well.  Sponge, lap, instrument and needle counts  were correct x2.  The patient went to the recovery room in stable condition.           ______________________________  Lesly Dukes, M.D.     Lora Paula  D:  05/17/2006  T:  05/17/2006  Job:  [id number]

## 2010-11-13 NOTE — Op Note (Signed)
NAME:  Susan Gross, Susan Gross NO.:  [id number]   MEDICAL RECORD NO.:  [id number]          PATIENT TYPE:  WOC   LOCATION:  [facility]                          [facility]   PHYSICIAN:  Lesly Dukes, M.D. DATE OF BIRTH:  1980-12-28   DATE OF PROCEDURE:  10/12/2005  DATE OF DISCHARGE:                                 OPERATIVE REPORT   PREOPERATIVE DIAGNOSIS:  The patient is a 30 year old para 2-0-1-2 with  chronic pelvic pain   POSTOPERATIVE DIAGNOSIS:  The patient is a 30 year old para 2-0-1-2 with  chronic pelvic pain with endometriosis.   OPERATION/PROCEDURE:  Diagnostic laparoscopy.   SURGEON:  Lesly Dukes, M.D.   ASSISTANT:  Dr. Blima Rich.   ANESTHESIA:  General.   PATHOLOGY:  None.   ESTIMATED BLOOD LOSS:  Minimal.   COMPLICATIONS:  None.   FINDINGS:  Extremely thin female, retroverted uterus, grossly normal uterus,  grossly normal fallopian tubes, grossly normal right ovary, grossly left  ovary with a small follicularcyst with __________ clear .  Grossly normal  appendix, grossly normal liver.  No adhesions were seen.  Cesarean section.  Mild endometriosis along bilateral uterosacral ligaments.  No free fluid, no  blood. Pelvic organs are __________.   DESCRIPTION OF PROCEDURE:  After informed consent was obtained, the patient  was taken to the operating room where general anesthesia was induced.  The  patient was placed in the dorsal lithotomy position and was prepped and  draped in the normal sterile fashion.  Foley was placed in the bladder. A  __________ was placed on the cervix to aid uterine mobilization.  Gowns and  gloves changed.  An infraumbilical incision was made with the scalpel and  carried down bluntly to the peritoneum which was incised bluntly and  extended superiorly and inferiorly.  Retractors were placed in the abdomen  and Hasson trocar was placed in the abdomen.  Pneumoperitoneum was achieved  with CO2.  Laparoscopic  was introduced and survey of the abdominal contents  was performed with the above findings.  A right lower quadrant port was  placed approximately 2 cm superior and medially to the inferior superior  iliac spine under direct visualization.  A blunt probe was introduced into  the abdomen to help in aiding the survey of the abdominal contents.  Once  again all organs were mobile and no pathology was noted except for  endometriosis described above.  All instruments were removed from the  patient's abdomen.  Pneumoperitoneum was released.  Laparoscope was  __________ removed as a single unit to avoid hernia formation.  The fascia  was closed with 0 Vicryl. The skin was closed with 4-0 Vicryl in  subcuticular fashion.  The  Hulkaclip was removed from the cervix and the strings were noted to be not  disturbed.  The patient tolerated the procedure well.  Sponge, lab,  instrument, and needle counts were x2.  The patient went to the recovery  room in stable condition.           ______________________________  Lesly Dukes, M.D.     KHL/MEDQ  D:  10/12/2005  T:  10/13/2005  Job:  [id number]

## 2010-11-20 ENCOUNTER — Other Ambulatory Visit: Payer: Self-pay | Admitting: Physician Assistant

## 2010-11-20 ENCOUNTER — Ambulatory Visit
Admission: RE | Admit: 2010-11-20 | Discharge: 2010-11-20 | Disposition: A | Discharge status: Home / Self Care | Payer: Self-pay | Source: Ambulatory Visit | Attending: Physician Assistant | Admitting: Physician Assistant

## 2010-11-20 DIAGNOSIS — R52 Pain, unspecified: Secondary | ICD-10-CM

## 2010-11-20 DIAGNOSIS — M545 Low back pain: Secondary | ICD-10-CM

## 2011-03-02 ENCOUNTER — Emergency Department (HOSPITAL_COMMUNITY)
Admission: EM | Admit: 2011-03-02 | Discharge: 2011-03-02 | Discharge status: Against Medical Advice | Payer: Self-pay | Attending: Emergency Medicine | Admitting: Emergency Medicine

## 2011-03-02 ENCOUNTER — Encounter: Payer: Self-pay | Admitting: Emergency Medicine

## 2011-03-02 DIAGNOSIS — Z5320 Procedure and treatment not carried out because of patient's decision for unspecified reasons: Secondary | ICD-10-CM | POA: Insufficient documentation

## 2011-03-02 DIAGNOSIS — R51 Headache: Secondary | ICD-10-CM | POA: Insufficient documentation

## 2011-03-02 NOTE — ED Notes (Signed)
Pt c/o headache x 2 days. Pt seen PCP for the same.

## 2011-03-26 LAB — DIFFERENTIAL
Basophils Absolute: 0
Basophils Relative: 1
Eosinophils Absolute: 0.1
Eosinophils Relative: 2
Monocytes Absolute: 0.5
Monocytes Relative: 5
Neutro Abs: 5

## 2011-03-26 LAB — POCT I-STAT, CHEM 8
Calcium, Ion: 1.11 — ABNORMAL LOW
Chloride: 103
Glucose, Bld: 91
HCT: 41
TCO2: 26

## 2011-03-26 LAB — URINALYSIS, ROUTINE W REFLEX MICROSCOPIC
Bilirubin Urine: NEGATIVE
Ketones, ur: NEGATIVE
Nitrite: NEGATIVE
Protein, ur: NEGATIVE
pH: 6

## 2011-03-26 LAB — CBC
HCT: 41.9
Hemoglobin: 14
MCHC: 33.3
MCV: 92.5
RBC: 4.53
RDW: 13.3

## 2011-03-26 LAB — URINE MICROSCOPIC-ADD ON

## 2011-03-26 LAB — POCT PREGNANCY, URINE: Preg Test, Ur: NEGATIVE

## 2011-11-12 ENCOUNTER — Encounter (HOSPITAL_COMMUNITY): Payer: Self-pay | Admitting: *Deleted

## 2011-11-12 ENCOUNTER — Emergency Department (HOSPITAL_COMMUNITY): Payer: Self-pay

## 2011-11-12 ENCOUNTER — Emergency Department (HOSPITAL_COMMUNITY)
Admission: EM | Admit: 2011-11-12 | Discharge: 2011-11-12 | Disposition: A | Discharge status: Home / Self Care | Payer: Self-pay | Attending: Emergency Medicine | Admitting: Emergency Medicine

## 2011-11-12 DIAGNOSIS — M549 Dorsalgia, unspecified: Secondary | ICD-10-CM | POA: Insufficient documentation

## 2011-11-12 DIAGNOSIS — R51 Headache: Secondary | ICD-10-CM | POA: Insufficient documentation

## 2011-11-12 DIAGNOSIS — R059 Cough, unspecified: Secondary | ICD-10-CM | POA: Insufficient documentation

## 2011-11-12 DIAGNOSIS — J3489 Other specified disorders of nose and nasal sinuses: Secondary | ICD-10-CM | POA: Insufficient documentation

## 2011-11-12 DIAGNOSIS — J069 Acute upper respiratory infection, unspecified: Secondary | ICD-10-CM | POA: Insufficient documentation

## 2011-11-12 DIAGNOSIS — R6889 Other general symptoms and signs: Secondary | ICD-10-CM | POA: Insufficient documentation

## 2011-11-12 DIAGNOSIS — R070 Pain in throat: Secondary | ICD-10-CM | POA: Insufficient documentation

## 2011-11-12 DIAGNOSIS — R05 Cough: Secondary | ICD-10-CM | POA: Insufficient documentation

## 2011-11-12 DIAGNOSIS — J343 Hypertrophy of nasal turbinates: Secondary | ICD-10-CM | POA: Insufficient documentation

## 2011-11-12 DIAGNOSIS — G8929 Other chronic pain: Secondary | ICD-10-CM | POA: Insufficient documentation

## 2011-11-12 HISTORY — DX: Fibromyalgia: M79.7

## 2011-11-12 HISTORY — DX: Dorsalgia, unspecified: M54.9

## 2011-11-12 HISTORY — DX: Pelvic and perineal pain: R10.2

## 2011-11-12 HISTORY — DX: Headache: R51

## 2011-11-12 HISTORY — DX: Suicide attempt, initial encounter: T14.91XA

## 2011-11-12 HISTORY — DX: Other chronic pain: G89.29

## 2011-11-12 HISTORY — DX: Unspecified ovarian cyst, unspecified side: N83.209

## 2011-11-12 MED ORDER — KETOROLAC TROMETHAMINE 60 MG/2ML IM SOLN
60.0000 mg | Freq: Once | INTRAMUSCULAR | Status: AC
  Administered 2011-11-12: 60 mg via INTRAMUSCULAR
  Filled 2011-11-12: qty 2

## 2011-11-12 MED ORDER — OXYCODONE-ACETAMINOPHEN 5-325 MG PO TABS
1.0000 | ORAL_TABLET | Freq: Once | ORAL | Status: AC
  Administered 2011-11-12: 1 via ORAL
  Filled 2011-11-12: qty 2

## 2011-11-12 MED ORDER — NAPROXEN 250 MG PO TABS: 250.0000 mg | ORAL_TABLET | Freq: Two times a day (BID) | ORAL | Status: DC

## 2011-11-12 NOTE — Discharge Instructions (Signed)
RESOURCE GUIDE  Dental Problems  Patients with Medicaid: [facility]                     [facility] [address]                                           [address] Phone:  [phone]                                                  Phone:  [phone]  If unable to pay or uninsured, contact:  [facility] or [facility] to become qualified for the adult dental clinic.  Chronic Pain Problems Contact [facility]  [phone] Patients need to be referred by their primary care doctor.  Insufficient Money for Medicine Contact [employer]:  call "211" or [facility] 271-5999.  No Primary Care Doctor Call [facility]  832-8000 Other agencies that provide inexpensive medical care    [facility]  [phone]    [facility]  [phone]    [facility]  [phone]    Women's Clinic  832-4777    [facility]  [phone]    [facility]  [phone]  Psychological Services [facility]  [phone] [facility]  [phone] [facility]   800 853-5163 (emergency services 641-4993)  Substance Abuse Resources [facility]  336-882-2125 [facility] 336-784-9470 [facility] 336-285-9073 [facility] 336-845-3988 Residential & Outpatient Substance Abuse Program  800-659-3381  Abuse/Neglect [place] Child Abuse Hotline (336) 641-3795 [place] Child Abuse Hotline 800-378-5315 (After Hours)  Emergency Shelter [facility] (336) 271-5985  Maternity Homes [facility] (336) 275-9566 [facility] (704) 372-4663  MRSA Hotline #:   832-7006    [place] Resources  [facility] [place]     [employer]                          [facility] [address] [place]                       [address]      [address]  [place]                                                [place]                            [place] Phone:  [phone]                                   Phone:  [phone]                 Phone:  [phone]  [facility] Phone:  [phone]    [facility] Child Abuse Hotline 737-768-4513 773-089-3427 (After Hours)   Take the prescription as directed.  Take over the counter tylenol, as directed on packaging, as needed for discomfort.  Take over the counter sudafed, as directed on packaging, for the next week.  Use over the counter normal saline nasal spray, as instructed in the Emergency Department, several times per day for the next 2 weeks.  Call your regular medical doctor today to schedule a follow up appointment next week.  Return to the Emergency Department immediately if worsening.

## 2011-11-12 NOTE — ED Notes (Signed)
Sharp, stabbing pain to right temple area x 2 days. Today, also having stiffness to right side of neck. States cough and "raw throat".

## 2011-11-12 NOTE — ED Notes (Signed)
Pt c/o L sided head pain described as sharp like a knife and also travels down left side of neck pt also states neck stiffness

## 2011-11-12 NOTE — ED Provider Notes (Signed)
History     CSN: [id number]  Arrival date & time 11/12/11  1318   First MD Initiated Contact with Patient 11/12/11 1330      Chief Complaint  Patient presents with  . Sore Throat  . Headache  . Cough     HPI Pt was seen at 1335.  Per pt, c/o gradual onset and persistence of constant sore throat, runny/stuffy nose, sinus and ears congestion, cough, and headache for the past 2-3 days.  Denies fevers, no rash, no SOB/CP, no N/V/D, no abd pain.  Describes the headache as per her usual chronic headache pain pattern for the past several years.  Denies headache was sudden or maximal in onset or at any time.  Denies visual changes, no focal motor weakness, no tingling/numbness in extremities, no neck pain.        Past Medical History  Diagnosis Date  . Fibromyalgia   . Chronic back pain   . Headache   . Endometriosis   . Ovarian cyst   . Suicide attempt   . Chronic pelvic pain in female   . Kidney stone     Past Surgical History  Procedure Date  . Abdominal hysterectomy   . Hernia repair     History  Substance Use Topics  . Smoking status: Current Everyday Smoker -- 0.5 packs/day    Types: Cigarettes  . Smokeless tobacco: Not on file  . Alcohol Use: No    Review of Systems ROS: Statement: All systems negative except as marked or noted in the HPI; Constitutional: Negative for fever and chills. ; ; Eyes: Negative for eye pain, redness and discharge. ; ; ENMT: Negative for ear pain, hoarseness, +nasal congestion, rhinorrhea, ears and sinus pressure and sore throat. ; ; Cardiovascular: Negative for chest pain, palpitations, diaphoresis, dyspnea and peripheral edema. ; ; Respiratory: +cough. Negative for wheezing and stridor. ; ; Gastrointestinal: Negative for nausea, vomiting, diarrhea, abdominal pain, blood in stool, hematemesis, jaundice and rectal bleeding. . ; ; Genitourinary: Negative for dysuria, flank pain and hematuria. ; ; Musculoskeletal: Negative for back pain and neck  pain. Negative for swelling and trauma.; ; Skin: Negative for pruritus, rash, abrasions, blisters, bruising and skin lesion.; ; Neuro: +headache. Negative for lightheadedness and neck stiffness. Negative for weakness, altered level of consciousness , altered mental status, extremity weakness, paresthesias, involuntary movement, seizure and syncope.     Allergies  Ciprofloxacin and Hydrocodone  Home Medications  No current outpatient prescriptions on file.  BP 147/82  Pulse 95  Temp(Src) 97.9 F (36.6 C) (Oral)  Resp 18  Ht 5\' 1"  (1.549 m)  Wt 102 lb (46.267 kg)  BMI 19.27 kg/m2  SpO2 100%  Physical Exam 1340: Physical examination:  Nursing notes reviewed; Vital signs and O2 SAT reviewed;  Constitutional: Well developed, Well nourished, Well hydrated, In no acute distress; Head:  Normocephalic, atraumatic; Eyes: EOMI, PERRL, No scleral icterus; ENMT: TM's clear bilat.  +edemetous nasal turbinates bilat with clear rhinorrhea. Mouth and pharynx normal, Mucous membranes moist; Neck: Supple, Full range of motion, No lymphadenopathy; Cardiovascular: Regular rate and rhythm, No murmur or gallop; Respiratory: Breath sounds clear & equal bilaterally, No rales, rhonchi, wheezes, speaking full sentences with ease, Normal respiratory effort/excursion; Chest: Nontender, Movement normal; Abdomen: Soft, Nontender, Nondistended, Normal bowel sounds; Genitourinary: No CVA tenderness; Extremities: Pulses normal, No tenderness, No edema, No calf edema or asymmetry.; Neuro: AA&Ox3, Major CN grossly intact.  No gross focal motor or sensory deficits in extremities.; Skin: Color normal,  Warm, Dry, no rash.    ED Course  Procedures    MDM  MDM Reviewed: nursing note, vitals and previous chart Interpretation: labs and x-ray     Results for orders placed during the hospital encounter of 11/12/11  RAPID STREP SCREEN      Component Value Range   Streptococcus, Group A Screen (Direct) NEGATIVE  NEGATIVE      Dg Chest 2 View 11/12/2011  *RADIOLOGY REPORT*  Clinical Data: Cough, fever.  CHEST - 2 VIEW  Comparison: 05/08/2009  Findings: Heart and mediastinal contours are within normal limits. No focal opacities or effusions.  No acute bony abnormality.  IMPRESSION: No active cardiopulmonary disease.  Original Report Authenticated By: Cyndie Chime, M.D.     4:04 PM:  Improved after meds.  Wants to go home now.  VSS.  Appears viral illness at this time, will tx symptomatically.  Dx testing d/w pt and family.  Questions answered.  Verb understanding, agreeable to d/c home with outpt f/u.         Laray Anger, DO 11/15/11 0002

## 2012-07-13 ENCOUNTER — Emergency Department (HOSPITAL_COMMUNITY)
Admission: EM | Admit: 2012-07-13 | Discharge: 2012-07-13 | Disposition: A | Discharge status: Home / Self Care | Payer: Medicaid Other | Attending: Emergency Medicine | Admitting: Emergency Medicine

## 2012-07-13 ENCOUNTER — Encounter (HOSPITAL_COMMUNITY): Payer: Self-pay | Admitting: *Deleted

## 2012-07-13 DIAGNOSIS — IMO0001 Reserved for inherently not codable concepts without codable children: Secondary | ICD-10-CM | POA: Insufficient documentation

## 2012-07-13 DIAGNOSIS — F17200 Nicotine dependence, unspecified, uncomplicated: Secondary | ICD-10-CM | POA: Insufficient documentation

## 2012-07-13 DIAGNOSIS — M549 Dorsalgia, unspecified: Secondary | ICD-10-CM | POA: Insufficient documentation

## 2012-07-13 DIAGNOSIS — Z8742 Personal history of other diseases of the female genital tract: Secondary | ICD-10-CM | POA: Insufficient documentation

## 2012-07-13 DIAGNOSIS — N949 Unspecified condition associated with female genital organs and menstrual cycle: Secondary | ICD-10-CM | POA: Insufficient documentation

## 2012-07-13 DIAGNOSIS — K0889 Other specified disorders of teeth and supporting structures: Secondary | ICD-10-CM

## 2012-07-13 DIAGNOSIS — G8929 Other chronic pain: Secondary | ICD-10-CM | POA: Insufficient documentation

## 2012-07-13 DIAGNOSIS — K089 Disorder of teeth and supporting structures, unspecified: Secondary | ICD-10-CM | POA: Insufficient documentation

## 2012-07-13 DIAGNOSIS — Z87442 Personal history of urinary calculi: Secondary | ICD-10-CM | POA: Insufficient documentation

## 2012-07-13 MED ORDER — OXYCODONE-ACETAMINOPHEN 5-325 MG PO TABS: 1.0000 | ORAL_TABLET | ORAL | Status: AC | PRN

## 2012-07-13 MED ORDER — AMOXICILLIN 500 MG PO CAPS: 500.0000 mg | ORAL_CAPSULE | Freq: Three times a day (TID) | ORAL | Status: DC

## 2012-07-13 NOTE — ED Notes (Signed)
Pt c/o lower dental pain that started a week ago,

## 2012-07-13 NOTE — ED Provider Notes (Signed)
History     CSN: [id number]  Arrival date & time 07/13/12  1353   First MD Initiated Contact with Patient 07/13/12 1435      Chief Complaint  Patient presents with  . Dental Pain    (Consider location/radiation/quality/duration/timing/severity/associated sxs/prior treatment) Patient is a 32 y.o. female presenting with tooth pain. The history is provided by the patient.  Dental PainThe primary symptoms include mouth pain. Primary symptoms do not include dental injury, oral bleeding, oral lesions, headaches, fever, shortness of breath, sore throat, angioedema or cough. The symptoms began more than 1 week ago. The symptoms are unchanged. The symptoms are new. The symptoms occur constantly.  Mouth pain began more than 1 week ago. Mouth pain occurs constantly. Mouth pain is unchanged. Affected locations include: teeth and gum(s).  Additional symptoms include: dental sensitivity to temperature and gum tenderness. Additional symptoms do not include: gum swelling, purulent gums, trismus, jaw pain, facial swelling, trouble swallowing and ear pain. Medical issues include: smoking and periodontal disease.    Past Medical History  Diagnosis Date  . Fibromyalgia   . Chronic back pain   . Headache   . Endometriosis   . Ovarian cyst   . Suicide attempt   . Chronic pelvic pain in female   . Kidney stone     Past Surgical History  Procedure Date  . Abdominal hysterectomy   . Hernia repair     No family history on file.  History  Substance Use Topics  . Smoking status: Current Every Day Smoker -- 0.5 packs/day    Types: Cigarettes  . Smokeless tobacco: Not on file  . Alcohol Use: No    OB History    Grav Para Term Preterm Abortions TAB SAB Ect Mult Living                  Review of Systems  Constitutional: Negative for fever and appetite change.  HENT: Positive for dental problem. Negative for ear pain, congestion, sore throat, facial swelling, trouble swallowing, neck pain and  neck stiffness.   Eyes: Negative for pain and visual disturbance.  Respiratory: Negative for cough and shortness of breath.   Genitourinary: Negative for dysuria, urgency, flank pain and decreased urine volume.  Neurological: Negative for dizziness, facial asymmetry and headaches.  Hematological: Negative for adenopathy.  All other systems reviewed and are negative.    Allergies  Ciprofloxacin and Hydrocodone  Home Medications   Current Outpatient Rx  Name  Route  Sig  Dispense  Refill  . IBUPROFEN 200 MG PO TABS   Oral   Take 800 mg by mouth every 4 (four) hours as needed. For pain           BP 145/83  Pulse 84  Temp 98.4 F (36.9 C)  Resp 16  Ht 5\' 2"  (1.575 m)  Wt 101 lb (45.813 kg)  BMI 18.47 kg/m2  SpO2 100%  Physical Exam  Nursing note and vitals reviewed. Constitutional: She is oriented to person, place, and time. She appears well-developed and well-nourished. No distress.  HENT:  Head: Normocephalic and atraumatic. No trismus in the jaw.  Right Ear: Tympanic membrane and ear canal normal.  Left Ear: Tympanic membrane and ear canal normal.  Mouth/Throat: Uvula is midline, oropharynx is clear and moist and mucous membranes are normal. Dental caries present. No dental abscesses or uvula swelling.    Neck: Normal range of motion. Neck supple.  Cardiovascular: Normal rate, regular rhythm, normal heart sounds and intact  distal pulses.   No murmur heard. Pulmonary/Chest: Effort normal and breath sounds normal.  Musculoskeletal: Normal range of motion.  Lymphadenopathy:    She has no cervical adenopathy.  Neurological: She is alert and oriented to person, place, and time. She exhibits normal muscle tone. Coordination normal.  Skin: Skin is warm and dry.    ED Course  Procedures (including critical care time)  Labs Reviewed - No data to display No results found.      MDM     ttp of left lower premolars.  Poor dental hygiene, widespread dental  decay.  No facial swelling, trismus, sublingual abnml, or dental abscess.pt agrees to f/u with a dentist  Prescribed: Amoxil Percocet #12     Kashis Penley L. [place], [place] 07/14/12 2211

## 2012-07-15 NOTE — ED Provider Notes (Signed)
Medical screening examination/treatment/procedure(s) were performed by non-physician practitioner and as supervising physician I was immediately available for consultation/collaboration.  Glynn Octave, MD 07/15/12 1000

## 2012-08-24 ENCOUNTER — Encounter (HOSPITAL_COMMUNITY): Payer: Self-pay | Admitting: *Deleted

## 2012-08-24 ENCOUNTER — Emergency Department (HOSPITAL_COMMUNITY)
Admission: EM | Admit: 2012-08-24 | Discharge: 2012-08-24 | Disposition: A | Discharge status: Home / Self Care | Payer: Medicaid Other | Attending: Emergency Medicine | Admitting: Emergency Medicine

## 2012-08-24 ENCOUNTER — Emergency Department (HOSPITAL_COMMUNITY): Payer: Medicaid Other

## 2012-08-24 DIAGNOSIS — N201 Calculus of ureter: Secondary | ICD-10-CM

## 2012-08-24 DIAGNOSIS — N23 Unspecified renal colic: Secondary | ICD-10-CM

## 2012-08-24 DIAGNOSIS — IMO0001 Reserved for inherently not codable concepts without codable children: Secondary | ICD-10-CM | POA: Insufficient documentation

## 2012-08-24 DIAGNOSIS — Z3202 Encounter for pregnancy test, result negative: Secondary | ICD-10-CM | POA: Insufficient documentation

## 2012-08-24 DIAGNOSIS — Z8742 Personal history of other diseases of the female genital tract: Secondary | ICD-10-CM | POA: Insufficient documentation

## 2012-08-24 DIAGNOSIS — F17200 Nicotine dependence, unspecified, uncomplicated: Secondary | ICD-10-CM | POA: Insufficient documentation

## 2012-08-24 DIAGNOSIS — M545 Low back pain, unspecified: Secondary | ICD-10-CM | POA: Insufficient documentation

## 2012-08-24 DIAGNOSIS — B9689 Other specified bacterial agents as the cause of diseases classified elsewhere: Secondary | ICD-10-CM

## 2012-08-24 DIAGNOSIS — G8929 Other chronic pain: Secondary | ICD-10-CM | POA: Insufficient documentation

## 2012-08-24 DIAGNOSIS — R112 Nausea with vomiting, unspecified: Secondary | ICD-10-CM | POA: Insufficient documentation

## 2012-08-24 DIAGNOSIS — N949 Unspecified condition associated with female genital organs and menstrual cycle: Secondary | ICD-10-CM | POA: Insufficient documentation

## 2012-08-24 DIAGNOSIS — Z87442 Personal history of urinary calculi: Secondary | ICD-10-CM | POA: Insufficient documentation

## 2012-08-24 DIAGNOSIS — N760 Acute vaginitis: Secondary | ICD-10-CM | POA: Insufficient documentation

## 2012-08-24 LAB — URINE MICROSCOPIC-ADD ON

## 2012-08-24 LAB — URINALYSIS, ROUTINE W REFLEX MICROSCOPIC
Glucose, UA: NEGATIVE mg/dL
Protein, ur: NEGATIVE mg/dL
Specific Gravity, Urine: 1.015 (ref 1.005–1.030)
Urobilinogen, UA: 0.2 mg/dL (ref 0.0–1.0)

## 2012-08-24 LAB — WET PREP, GENITAL: Yeast Wet Prep HPF POC: NONE SEEN

## 2012-08-24 LAB — PREGNANCY, URINE: Preg Test, Ur: NEGATIVE

## 2012-08-24 MED ORDER — OXYCODONE-ACETAMINOPHEN 5-325 MG PO TABS: ORAL_TABLET | ORAL | Status: DC

## 2012-08-24 MED ORDER — NAPROXEN 250 MG PO TABS: 250.0000 mg | ORAL_TABLET | Freq: Two times a day (BID) | ORAL | Status: DC

## 2012-08-24 MED ORDER — ONDANSETRON HCL 4 MG PO TABS: 4.0000 mg | ORAL_TABLET | Freq: Three times a day (TID) | ORAL | Status: DC | PRN

## 2012-08-24 MED ORDER — KETOROLAC TROMETHAMINE 60 MG/2ML IM SOLN
60.0000 mg | Freq: Once | INTRAMUSCULAR | Status: AC
  Administered 2012-08-24: 60 mg via INTRAMUSCULAR
  Filled 2012-08-24: qty 2

## 2012-08-24 MED ORDER — HYDROMORPHONE HCL PF 1 MG/ML IJ SOLN
1.0000 mg | Freq: Once | INTRAMUSCULAR | Status: AC
  Administered 2012-08-24: 1 mg via INTRAMUSCULAR
  Filled 2012-08-24: qty 1

## 2012-08-24 MED ORDER — METRONIDAZOLE 500 MG PO TABS: 500.0000 mg | ORAL_TABLET | Freq: Two times a day (BID) | ORAL | Status: DC

## 2012-08-24 MED ORDER — OXYCODONE-ACETAMINOPHEN 5-325 MG PO TABS
2.0000 | ORAL_TABLET | Freq: Once | ORAL | Status: AC
  Administered 2012-08-24: 2 via ORAL
  Filled 2012-08-24: qty 2

## 2012-08-24 NOTE — ED Notes (Signed)
Patient would like something to drink at this time. RN made aware. 

## 2012-08-24 NOTE — ED Notes (Signed)
Patient states she would like something for pain. RN made aware.

## 2012-08-24 NOTE — ED Notes (Signed)
Pelvic cart at bedside. 

## 2012-08-24 NOTE — ED Provider Notes (Signed)
History     CSN: [id number]  Arrival date & time 08/24/12  1110   First MD Initiated Contact with Patient 08/24/12 1435      Chief Complaint  Patient presents with  . Flank Pain    HPI Pt was seen at 1505.   Per pt, c/o gradual onset and persistence of constant acute flair of her chronic low back and pelvic "pain" that began yesterday.  Has been associated with N/V x1 and "white" vaginal discharge.  Describes her pain as per her usual chronic pain pattern.  Denies diarrhea, no dysuria/hematuria, no black or blood in stools or emesis, no vaginal bleeding, no CP/SOB.     Past Medical History  Diagnosis Date  . Fibromyalgia   . Chronic back pain   . Headache   . Endometriosis   . Ovarian cyst   . Suicide attempt   . Chronic pelvic pain in female   . Kidney stone     Past Surgical History  Procedure Laterality Date  . Abdominal hysterectomy    . Hernia repair      History  Substance Use Topics  . Smoking status: Current Every Day Smoker -- 0.50 packs/day    Types: Cigarettes  . Smokeless tobacco: Not on file  . Alcohol Use: No     Review of Systems ROS: Statement: All systems negative except as marked or noted in the HPI; Constitutional: Negative for fever and chills. ; ; Eyes: Negative for eye pain, redness and discharge. ; ; ENMT: Negative for ear pain, hoarseness, nasal congestion, sinus pressure and sore throat. ; ; Cardiovascular: Negative for chest pain, palpitations, diaphoresis, dyspnea and peripheral edema. ; ; Respiratory: Negative for cough, wheezing and stridor. ; ; Gastrointestinal: +N/V. Negative for diarrhea, abdominal pain, blood in stool, hematemesis, jaundice and rectal bleeding. . ; ; Genitourinary: +flank pain. Negative for dysuria and hematuria. ; ; GYN:  No vaginal bleeding,+pelvic pain, +vaginal discharge, no vulvar pain. ;; Musculoskeletal: Negative for back pain and neck pain. Negative for swelling and trauma.; ; Skin: Negative for pruritus, rash,  abrasions, blisters, bruising and skin lesion.; ; Neuro: Negative for headache, lightheadedness and neck stiffness. Negative for weakness, altered level of consciousness , altered mental status, extremity weakness, paresthesias, involuntary movement, seizure and syncope.      Allergies  Ciprofloxacin and Hydrocodone  Home Medications   Current Outpatient Rx  Name  Route  Sig  Dispense  Refill  . ibuprofen (ADVIL,MOTRIN) 200 MG tablet   Oral   Take 800 mg by mouth every 4 (four) hours as needed. For pain           BP 116/74  Pulse 76  Temp(Src) 98.2 F (36.8 C) (Oral)  Resp 16  Ht 5\' 2"  (1.575 m)  Wt 97 lb (43.999 kg)  BMI 17.74 kg/m2  SpO2 98%  Physical Exam 1510: Physical examination:  Nursing notes reviewed; Vital signs and O2 SAT reviewed;  Constitutional: Well developed, Well nourished, Well hydrated, In no acute distress; Head:  Normocephalic, atraumatic; Eyes: EOMI, PERRL, No scleral icterus; ENMT: Mouth and pharynx normal, Mucous membranes moist; Neck: Supple, Full range of motion, No lymphadenopathy; Cardiovascular: Regular rate and rhythm, No murmur, rub, or gallop; Respiratory: Breath sounds clear & equal bilaterally, No rales, rhonchi, wheezes.  Speaking full sentences with ease, Normal respiratory effort/excursion; Chest: Nontender, Movement normal; Abdomen: Soft, Nontender, Nondistended, Normal bowel sounds; Genitourinary: No CVA tenderness. Pelvic exam performed with permission of pt and female ED tech assist during  exam.  External genitalia w/o lesions. Vaginal vault with thick white discharge.  Cervix surgically absent.   Bimanual exam with generalized pelvic tenderness.; Spine:  No midline CS, TS, LS tenderness.  +TTP right lumbar paraspinal muscles.;; Extremities: Pulses normal, No tenderness, No edema, No calf edema or asymmetry.; Neuro: AA&Ox3, Major CN grossly intact.  Speech clear. No gross focal motor or sensory deficits in extremities.; Skin: Color normal, Warm,  Dry.   ED Course  Procedures    MDM  MDM Reviewed: previous chart, nursing note and vitals Reviewed previous: labs Interpretation: labs and CT scan   Results for orders placed during the hospital encounter of 08/24/12  WET PREP, GENITAL      Result Value Range   Yeast Wet Prep HPF POC NONE SEEN  NONE SEEN   Trich, Wet Prep NONE SEEN  NONE SEEN   Clue Cells Wet Prep HPF POC FEW (*) NONE SEEN   WBC, Wet Prep HPF POC FEW (*) NONE SEEN  URINALYSIS, ROUTINE W REFLEX MICROSCOPIC      Result Value Range   Color, Urine YELLOW  YELLOW   APPearance CLEAR  CLEAR   Specific Gravity, Urine 1.015  1.005 - 1.030   pH 7.0  5.0 - 8.0   Glucose, UA NEGATIVE  NEGATIVE mg/dL   Hgb urine dipstick LARGE (*) NEGATIVE   Bilirubin Urine NEGATIVE  NEGATIVE   Ketones, ur NEGATIVE  NEGATIVE mg/dL   Protein, ur NEGATIVE  NEGATIVE mg/dL   Urobilinogen, UA 0.2  0.0 - 1.0 mg/dL   Nitrite NEGATIVE  NEGATIVE   Leukocytes, UA SMALL (*) NEGATIVE  PREGNANCY, URINE      Result Value Range   Preg Test, Ur NEGATIVE  NEGATIVE  URINE MICROSCOPIC-ADD ON      Result Value Range   Squamous Epithelial / LPF FEW (*) RARE   WBC, UA 7-10  <3 WBC/hpf   RBC / HPF 7-10  <3 RBC/hpf   Bacteria, UA MANY (*) RARE   Ct Abdomen Pelvis Wo Contrast 08/24/2012  *RADIOLOGY REPORT*  Clinical Data: Flank pain, lower abdominal pain, vomiting.  CT ABDOMEN AND PELVIS WITHOUT CONTRAST  Technique:  Multidetector CT imaging of the abdomen and pelvis was performed following the standard protocol without intravenous contrast.  Comparison: 01/17/2008  Findings: Lung bases are clear.  No effusions.  Heart is normal size.  Liver, gallbladder, spleen, pancreas, adrenals have an unremarkable unenhanced appearance.  Small low-density area in the mid pole of the left kidney cannot be characterized without IV contrast and is similar to prior study, likely small cyst.  Punctate nonobstructing stones within the mid pole of the left kidney and lower  pole of the right kidney.  Slight fullness of the right renal collecting system with perinephric stranding.  It is difficult to follow the right ureter.  There is a punctate calcification near the pelvic brim on image 57 and a 3 mm stone in the right pelvis on image 62.  While these may represent vascular calcifications or phleboliths.  Given the slight fullness and perinephric stranding on the right, I cannot exclude distal ureteral stone.  2.9 cm cyst in the right ovary.  No left adnexal mass.  No free fluid or free air.  Bowel grossly unremarkable.  Aorta is normal caliber.  No acute bony abnormality.  IMPRESSION: Slight fullness of the right renal pelvis with slight perinephric stranding.  Calcifications in the right anatomic pelvis as described above.  It is difficult to follow the right  ureter. Therefore, I cannot exclude a small distal ureteral stone on the right.  Punctate bilateral nephrolithiasis.   Original Report Authenticated By: Charlett Nose, M.D.     [phone]:  Will tx for BV with flagyl.  Udip without clear UTI, UC is pending.  Will tx symptomatically for possible right ureteral stone.  Pt has tol PO well while in the ED without N/V.  No stooling while in the ED.  Wants to go home now. Dx and testing d/w pt.  Questions answered.  Verb understanding, agreeable to d/c home with outpt f/u.          Laray Anger, DO 08/27/12 1735

## 2012-08-24 NOTE — ED Notes (Signed)
Rt flank pain, vag d/c, nausea, Visual disturbance when closes her eyes  Then opens them.  Vomited x1,  Feels constipated. Small bm 2 days ago.

## 2012-08-24 NOTE — ED Notes (Signed)
Patient requesting pain medication. Dr Clarene Duke aware.

## 2012-08-24 NOTE — ED Notes (Signed)
Report received from offgoing RN

## 2012-08-26 LAB — URINE CULTURE: Colony Count: >=100000

## 2012-08-27 ENCOUNTER — Telehealth (HOSPITAL_COMMUNITY): Payer: Self-pay | Admitting: Emergency Medicine

## 2012-08-27 NOTE — ED Notes (Signed)
+   Urine Chart sent to EDP office for review. 

## 2012-08-27 NOTE — ED Notes (Signed)
Patient has +Urine culture. Checking to see if appropriately treated. °

## 2012-08-29 NOTE — ED Notes (Signed)
Patient informed of positive results after id'd x 2 and requests that rx be called to [facility] @ [phone]

## 2012-08-29 NOTE — ED Notes (Signed)
Chart returned from EDP office .Start Cipro IR 500 mg BID x 7 days written by Roxy Horseman.

## 2012-10-25 ENCOUNTER — Encounter: Payer: Self-pay | Admitting: Family Medicine

## 2012-10-25 DIAGNOSIS — N3010 Interstitial cystitis (chronic) without hematuria: Secondary | ICD-10-CM | POA: Insufficient documentation

## 2012-10-25 DIAGNOSIS — G8929 Other chronic pain: Secondary | ICD-10-CM | POA: Insufficient documentation

## 2012-10-25 DIAGNOSIS — R102 Pelvic and perineal pain: Secondary | ICD-10-CM | POA: Insufficient documentation

## 2012-10-26 ENCOUNTER — Ambulatory Visit (INDEPENDENT_AMBULATORY_CARE_PROVIDER_SITE_OTHER): Payer: Medicaid Other | Admitting: Physician Assistant

## 2012-10-26 ENCOUNTER — Encounter: Payer: Self-pay | Admitting: Physician Assistant

## 2012-10-26 VITALS — BP 132/80 | HR 96 | Temp 97.8°F | Resp 20 | Ht 61.0 in | Wt 95.0 lb

## 2012-10-26 DIAGNOSIS — M542 Cervicalgia: Secondary | ICD-10-CM

## 2012-10-26 DIAGNOSIS — G8929 Other chronic pain: Secondary | ICD-10-CM

## 2012-10-26 DIAGNOSIS — F411 Generalized anxiety disorder: Secondary | ICD-10-CM

## 2012-10-26 DIAGNOSIS — S6000XA Contusion of unspecified finger without damage to nail, initial encounter: Secondary | ICD-10-CM

## 2012-10-26 DIAGNOSIS — M549 Dorsalgia, unspecified: Secondary | ICD-10-CM

## 2012-10-26 DIAGNOSIS — S60022A Contusion of left index finger without damage to nail, initial encounter: Secondary | ICD-10-CM

## 2012-10-26 MED ORDER — NAPROXEN 500 MG PO TABS: 500.0000 mg | ORAL_TABLET | Freq: Two times a day (BID) | ORAL | Status: DC

## 2012-10-26 MED ORDER — HYDROCODONE-ACETAMINOPHEN 5-325 MG PO TABS: 1.0000 | ORAL_TABLET | Freq: Four times a day (QID) | ORAL | Status: DC | PRN

## 2012-10-26 MED ORDER — CYCLOBENZAPRINE HCL 10 MG PO TABS: 10.0000 mg | ORAL_TABLET | Freq: Three times a day (TID) | ORAL | Status: DC | PRN

## 2012-10-26 MED ORDER — METHYLPREDNISOLONE ACETATE 80 MG/ML IJ SUSP
80.0000 mg | Freq: Once | INTRAMUSCULAR | Status: AC
  Administered 2012-10-26: 80 mg via INTRAMUSCULAR

## 2012-10-26 MED ORDER — KETOROLAC TROMETHAMINE 60 MG/2ML IM SOLN
60.0000 mg | Freq: Once | INTRAMUSCULAR | Status: AC
  Administered 2012-10-26: 60 mg via INTRAMUSCULAR
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
## 2012-11-09 ENCOUNTER — Other Ambulatory Visit: Payer: Self-pay | Admitting: Physician Assistant

## 2012-11-09 NOTE — Telephone Encounter (Signed)
Need approval for these refills

## 2012-11-10 NOTE — Telephone Encounter (Signed)
Approved. May refill all 3 meds for # 30/ 0 refills

## 2012-11-10 NOTE — Telephone Encounter (Signed)
Approved. May refill all 3 meds for # 30 with 0 refills

## 2012-11-10 NOTE — Telephone Encounter (Signed)
Refills called to pharmacy  ?

## 2012-11-14 ENCOUNTER — Telehealth: Payer: Self-pay | Admitting: Physician Assistant

## 2012-11-14 DIAGNOSIS — M542 Cervicalgia: Secondary | ICD-10-CM

## 2012-11-15 MED ORDER — NAPROXEN 500 MG PO TABS: 500.0000 mg | ORAL_TABLET | Freq: Two times a day (BID) | ORAL | Status: DC

## 2012-11-15 NOTE — Telephone Encounter (Signed)
Can refill Naprosyn 500mg  one po BID with food prn. # 60 / 1 additional refill. As far as referral, can refer to Physical Therapy.  I do not think referral to Ortho is indicated. They will not do anything different.  Approved for referal to PT Order Naprosyn and Referal to PT

## 2012-11-15 NOTE — Telephone Encounter (Signed)
Left mess for pt to call me back.  P.T. Ref entered  And Rx sent to pharm

## 2012-11-28 ENCOUNTER — Other Ambulatory Visit: Payer: Self-pay | Admitting: Physician Assistant

## 2012-11-28 NOTE — Telephone Encounter (Signed)
Last refill 11/09/12 #30.   Last OV 10/26/12.  Please advise

## 2012-11-28 NOTE — Telephone Encounter (Signed)
RX sent

## 2012-11-28 NOTE — Telephone Encounter (Signed)
Approved. Can go ahead and give # 60 instead of 30. # 60 / plus one additional refill

## 2012-11-29 ENCOUNTER — Ambulatory Visit: Payer: Medicaid Other | Admitting: Physical Therapy

## 2013-01-29 ENCOUNTER — Ambulatory Visit
Admission: RE | Admit: 2013-01-29 | Discharge: 2013-01-29 | Disposition: A | Discharge status: Home / Self Care | Payer: Medicaid Other | Source: Ambulatory Visit | Attending: Physician Assistant | Admitting: Physician Assistant

## 2013-01-29 ENCOUNTER — Ambulatory Visit (INDEPENDENT_AMBULATORY_CARE_PROVIDER_SITE_OTHER): Payer: Medicaid Other | Admitting: Physician Assistant

## 2013-01-29 ENCOUNTER — Encounter: Payer: Self-pay | Admitting: Physician Assistant

## 2013-01-29 VITALS — BP 116/70 | HR 92 | Temp 98.4°F | Resp 20 | Wt 95.0 lb

## 2013-01-29 DIAGNOSIS — F3289 Other specified depressive episodes: Secondary | ICD-10-CM

## 2013-01-29 DIAGNOSIS — M546 Pain in thoracic spine: Secondary | ICD-10-CM

## 2013-01-29 DIAGNOSIS — M545 Low back pain: Secondary | ICD-10-CM

## 2013-01-29 DIAGNOSIS — G8929 Other chronic pain: Secondary | ICD-10-CM

## 2013-01-29 DIAGNOSIS — F411 Generalized anxiety disorder: Secondary | ICD-10-CM

## 2013-01-29 DIAGNOSIS — F329 Major depressive disorder, single episode, unspecified: Secondary | ICD-10-CM

## 2013-01-29 DIAGNOSIS — M79604 Pain in right leg: Secondary | ICD-10-CM

## 2013-01-29 DIAGNOSIS — N3010 Interstitial cystitis (chronic) without hematuria: Secondary | ICD-10-CM

## 2013-01-29 DIAGNOSIS — M549 Dorsalgia, unspecified: Secondary | ICD-10-CM

## 2013-01-29 DIAGNOSIS — M542 Cervicalgia: Secondary | ICD-10-CM

## 2013-01-29 MED ORDER — CYCLOBENZAPRINE HCL 10 MG PO TABS: ORAL_TABLET | ORAL | Status: DC

## 2013-01-29 MED ORDER — NAPROXEN 500 MG PO TABS: 500.0000 mg | ORAL_TABLET | Freq: Two times a day (BID) | ORAL | Status: DC

## 2013-01-29 MED ORDER — HYDROCODONE-ACETAMINOPHEN 5-325 MG PO TABS: 1.0000 | ORAL_TABLET | Freq: Every evening | ORAL | Status: DC | PRN

## 2013-01-30 ENCOUNTER — Encounter: Payer: Self-pay | Admitting: Family Medicine

## 2013-01-30 NOTE — Progress Notes (Signed)
Patient ID: Susan Gross MRN: [id number], DOB: 11/10/1980, 32 y.o. Date of Encounter: @DATE @  Chief Complaint:  Chief Complaint  Patient presents with  . still with back pain  not getting any better    medicaid does not cover P.T.  needs med refills    HPI: 32 y.o. year old white female  presents for f/u of pain in neck, shoulder blades, and back. Her LOV with me was 10/28/12. At that time she was experiencing severe pain and muscle spasms in right neck, down right back. Was treated with IM Toradol, DepoMedrol, Flexeril, Naprosyn. Had planned f/u with PT but not covered by [facility]. Today she reports a lot of pain near midline, just below level of bra strap. Also pain in right posterior shoulder. Also in right low back with tingling down right leg.  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 However, b/c of pain, has only been cleaning about 2 houses every other week.  Had refilled Flexeril, taking those, but now out of them. Also, using naprosyn.  Says home stress is "about the same." Caught boyfriend cheating on her. She and her children are living at her mom's house now-for past week.   In past, Cymbalta helped very little with pain or anxiety/depression.   Past Medical History  Diagnosis Date  . Fibromyalgia   . Chronic back pain   . Headache(784.0)   . Endometriosis   . Ovarian cyst   . Suicide attempt   . Chronic pelvic pain in female   . Kidney stone   . Anxiety   . Depression   . Interstitial cystitis      Home Meds: See attached medication section for current medication list. Any medications entered into computer today will not appear on this note's list. The medications listed below were entered prior to today. Current Outpatient Prescriptions on File Prior to Visit  Medication Sig Dispense Refill  . ondansetron (ZOFRAN) 4 MG tablet Take 1 tablet (4 mg total) by mouth every 8 (eight) hours as needed for nausea.  6 tablet  0  . pentosan polysulfate (ELMIRON) 100 MG capsule Take  100 mg by mouth daily.      Marland Kitchen ibuprofen (ADVIL,MOTRIN) 200 MG tablet Take 800 mg by mouth every 4 (four) hours as needed. For pain       No current facility-administered medications on file prior to visit.    Allergies:  Allergies  Allergen Reactions  . Ciprofloxacin Nausea And Vomiting    History   Social History  . Marital Status: Legally Separated    Spouse Name: N/A    Number of Children: N/A  . Years of Education: N/A   Occupational History  . Not on file.   Social History Main Topics  . Smoking status: Current Every Day Smoker -- 0.50 packs/day    Types: Cigarettes  . Smokeless tobacco: Not on file  . Alcohol Use: No  . Drug Use: No  . Sexually Active: Yes    Birth Control/ Protection: Surgical   Other Topics Concern  . Not on file   Social History Narrative  . No narrative on file    No family history on file.   Review of Systems:  See HPI for pertinent ROS. All other ROS negative.    Physical Exam: Blood pressure 116/70, pulse 92, temperature 98.4 F (36.9 C), temperature source Oral, resp. rate 20, weight 95 lb (43.092 kg)., Body mass index is 17.96 kg/(m^2). General: Petite WF. Appears in no acute  distress. Lungs: Clear bilaterally to auscultation without wheezes, rales, or rhonchi. Breathing is unlabored. Heart: RRR with S1 S2. No murmurs, rubs, or gallops. Musculoskeletal:  Strength and tone normal for age. Multiple areas of tenderness with palpation of bilateral neck, shoulder blades, lower back.  Extremities/Skin: Warm and dry.  No edema.  Neuro: Alert and oriented X 3. Moves all extremities spontaneously. Gait is normal. CNII-XII grossly in tact. Psych:  Responds to questions appropriately with a normal affect.     ASSESSMENT AND PLAN:  32 y.o. year old female with  1. Chronic back pain Has been years since had any XRays. Will obtain XRays. If these show no significant abnormalities, will refer to pain clinic. Will use Flexeril in menatime.  Use Norco only HS for sleep.  - cyclobenzaprine (FLEXERIL) 10 MG tablet; TAKE ONE TABLET BY MOUTH THREE TIMES DAILY AS NEEDED  Dispense: 60 tablet; Refill: 1 - Ambulatory referral to Pain Clinic - HYDROcodone-acetaminophen (NORCO/VICODIN) 5-325 MG per tablet; Take 1 tablet by mouth at bedtime as needed for pain.  Dispense: 30 tablet; Refill: 0  2. Neck pain - DG Cervical Spine Complete; Future - cyclobenzaprine (FLEXERIL) 10 MG tablet; TAKE ONE TABLET BY MOUTH THREE TIMES DAILY AS NEEDED  Dispense: 60 tablet; Refill: 1 - Ambulatory referral to Pain Clinic - HYDROcodone-acetaminophen (NORCO/VICODIN) 5-325 MG per tablet; Take 1 tablet by mouth at bedtime as needed for pain.  Dispense: 30 tablet; Refill: 0  3. Back pain, thoracic - DG Thoracic Spine W/Swimmers; Future - cyclobenzaprine (FLEXERIL) 10 MG tablet; TAKE ONE TABLET BY MOUTH THREE TIMES DAILY AS NEEDED  Dispense: 60 tablet; Refill: 1 - Ambulatory referral to Pain Clinic - HYDROcodone-acetaminophen (NORCO/VICODIN) 5-325 MG per tablet; Take 1 tablet by mouth at bedtime as needed for pain.  Dispense: 30 tablet; Refill: 0  4. Low back pain radiating to right leg - DG Lumbar Spine Complete; Future - Ambulatory referral to Pain Clinic - HYDROcodone-acetaminophen (NORCO/VICODIN) 5-325 MG per tablet; Take 1 tablet by mouth at bedtime as needed for pain.  Dispense: 30 tablet; Refill: 0  5. Interstitial cystitis Sees Urology. On med.  6. ANXIETY She does not want to start new med for this now.  7. DEPRESSION   8. Acute neck pain - naproxen (NAPROSYN) 500 MG tablet; Take 1 tablet (500 mg total) by mouth 2 (two) times daily with a meal.  Dispense: 60 tablet; Refill: 1 - HYDROcodone-acetaminophen (NORCO/VICODIN) 5-325 MG per tablet; Take 1 tablet by mouth at bedtime as needed for pain.  Dispense: 30 tablet; Refill: 0  30 minutes spent with pt in direct contact with pt and discussing her pain, her stress, reviewing past  notes.  Signed, [address] [place], [place], [facility] 01/30/2013 11:26 AM

## 2013-02-07 ENCOUNTER — Encounter: Payer: Medicaid Other | Admitting: Physician Assistant

## 2013-06-15 ENCOUNTER — Encounter (HOSPITAL_COMMUNITY): Payer: Self-pay | Admitting: Emergency Medicine

## 2013-06-15 ENCOUNTER — Emergency Department (HOSPITAL_COMMUNITY)
Admission: EM | Admit: 2013-06-15 | Discharge: 2013-06-15 | Disposition: A | Discharge status: Home / Self Care | Payer: Medicaid Other | Attending: Emergency Medicine | Admitting: Emergency Medicine

## 2013-06-15 ENCOUNTER — Emergency Department (HOSPITAL_COMMUNITY): Payer: Medicaid Other

## 2013-06-15 DIAGNOSIS — Z8742 Personal history of other diseases of the female genital tract: Secondary | ICD-10-CM | POA: Insufficient documentation

## 2013-06-15 DIAGNOSIS — F17200 Nicotine dependence, unspecified, uncomplicated: Secondary | ICD-10-CM | POA: Insufficient documentation

## 2013-06-15 DIAGNOSIS — IMO0001 Reserved for inherently not codable concepts without codable children: Secondary | ICD-10-CM | POA: Insufficient documentation

## 2013-06-15 DIAGNOSIS — G8929 Other chronic pain: Secondary | ICD-10-CM | POA: Insufficient documentation

## 2013-06-15 DIAGNOSIS — J069 Acute upper respiratory infection, unspecified: Secondary | ICD-10-CM | POA: Insufficient documentation

## 2013-06-15 DIAGNOSIS — Z791 Long term (current) use of non-steroidal anti-inflammatories (NSAID): Secondary | ICD-10-CM | POA: Insufficient documentation

## 2013-06-15 DIAGNOSIS — Z8659 Personal history of other mental and behavioral disorders: Secondary | ICD-10-CM | POA: Insufficient documentation

## 2013-06-15 DIAGNOSIS — Z79899 Other long term (current) drug therapy: Secondary | ICD-10-CM | POA: Insufficient documentation

## 2013-06-15 DIAGNOSIS — R319 Hematuria, unspecified: Secondary | ICD-10-CM | POA: Insufficient documentation

## 2013-06-15 DIAGNOSIS — Z87442 Personal history of urinary calculi: Secondary | ICD-10-CM | POA: Insufficient documentation

## 2013-06-15 LAB — URINALYSIS, ROUTINE W REFLEX MICROSCOPIC
Glucose, UA: NEGATIVE mg/dL
Protein, ur: 30 mg/dL — AB

## 2013-06-15 LAB — URINE MICROSCOPIC-ADD ON

## 2013-06-15 MED ORDER — ALBUTEROL SULFATE HFA 108 (90 BASE) MCG/ACT IN AERS
2.0000 | INHALATION_SPRAY | Freq: Once | RESPIRATORY_TRACT | Status: AC
  Administered 2013-06-15: 2 via RESPIRATORY_TRACT
  Filled 2013-06-15: qty 6.7

## 2013-06-15 NOTE — ED Notes (Signed)
Sick for 2 days with cough, sore throat.  Nonproductive cough.  Nasal congestion.  Dysuria.

## 2013-06-15 NOTE — ED Provider Notes (Signed)
CSN: [id number]     Arrival date & time 06/15/13  1330 History   First MD Initiated Contact with Patient 06/15/13 1419     Chief Complaint  Patient presents with  . Cough  . Nasal Congestion   (Consider location/radiation/quality/duration/timing/severity/associated sxs/prior Treatment) Patient is a 32 y.o. female presenting with cough and dysuria. The history is provided by the patient. No language interpreter was used.  Cough Cough characteristics:  Non-productive, dry and hacking Severity:  Moderate Duration:  2 days Progression:  Waxing and waning Smoker: yes   Context: not sick contacts   Associated symptoms: fever and sore throat   Associated symptoms: no rash and no wheezing   Fever:    Duration:  2 days Sore throat:    Severity:  Moderate   Duration:  2 days Risk factors: no recent infection and no recent travel   Dysuria Pain quality:  Aching Duration:  4 days Timing:  Intermittent Recent urinary tract infections: no   Urinary symptoms: hematuria   Associated symptoms: fever   Associated symptoms: no nausea and no vomiting   Risk factors: hx of urolithiasis    Pt is a 32 year old female who presents with a history of cough and dysuria. She reports that she has had a dry, non-productive cough for approx the last 2 days with nasal congestion and drainage down her throat. She reports subjective fever. She also reports an approx 4 days of dysuria and hematuria. She reports that she had a history of kidney stones and cystitis.   Past Medical History  Diagnosis Date  . Fibromyalgia   . Chronic back pain   . Headache(784.0)   . Endometriosis   . Ovarian cyst   . Suicide attempt   . Chronic pelvic pain in female   . Kidney stone   . Anxiety   . Depression   . Interstitial cystitis    Past Surgical History  Procedure Laterality Date  . Abdominal hysterectomy    . Hernia repair     No family history on file. History  Substance Use Topics  . Smoking status:  Current Every Day Smoker -- 0.50 packs/day    Types: Cigarettes  . Smokeless tobacco: Not on file  . Alcohol Use: No   OB History   Grav Para Term Preterm Abortions TAB SAB Ect Mult Living                 Review of Systems  Constitutional: Positive for fever.  HENT: Positive for sore throat.   Respiratory: Positive for cough. Negative for wheezing.   Gastrointestinal: Negative for nausea and vomiting.  Genitourinary: Positive for dysuria.  Skin: Negative for rash.  All other systems reviewed and are negative.    Allergies  Ciprofloxacin  Home Medications   Current Outpatient Rx  Name  Route  Sig  Dispense  Refill  . cyclobenzaprine (FLEXERIL) 10 MG tablet   Oral   Take 10 mg by mouth 3 (three) times daily as needed for muscle spasms.         Marland Kitchen ibuprofen (ADVIL,MOTRIN) 200 MG tablet   Oral   Take 1,000 mg by mouth every 8 (eight) hours as needed for moderate pain. For pain         . naproxen (NAPROSYN) 500 MG tablet   Oral   Take 1 tablet (500 mg total) by mouth 2 (two) times daily with a meal.   60 tablet   1   . pentosan polysulfate (  ELMIRON) 100 MG capsule   Oral   Take 100 mg by mouth daily.          BP 140/81  Pulse 97  Temp(Src) 98.3 F (36.8 C) (Oral)  Resp 18  Ht 5\' 2"  (1.575 m)  Wt 100 lb (45.36 kg)  BMI 18.29 kg/m2  SpO2 100% Physical Exam  Nursing note and vitals reviewed. Constitutional: She is oriented to person, place, and time. She appears well-developed and well-nourished.  HENT:  Head: Normocephalic and atraumatic.  Right Ear: External ear normal.  Left Ear: External ear normal.  Mild post-oropharyngeal erythema.  Eyes: Conjunctivae and EOM are normal.  Neck: Normal range of motion. Neck supple. No JVD present. No tracheal deviation present. No thyromegaly present.  Cardiovascular: Normal rate, regular rhythm and normal heart sounds.   Pulmonary/Chest: Effort normal and breath sounds normal. No respiratory distress. She has no  decreased breath sounds. She has no wheezes. She has no rales.  Abdominal: Soft. Bowel sounds are normal. She exhibits no distension. There is no tenderness. There is no rebound.  Musculoskeletal: Normal range of motion.  Lymphadenopathy:    She has no cervical adenopathy.  Neurological: She is alert and oriented to person, place, and time.  Skin: Skin is warm and dry.  Psychiatric: She has a normal mood and affect. Her behavior is normal. Judgment and thought content normal.    ED Course  Procedures (including critical care time) Labs Review Labs Reviewed  URINALYSIS, ROUTINE W REFLEX MICROSCOPIC - Abnormal; Notable for the following:    APPearance CLOUDY (*)    Specific Gravity, Urine >1.030 (*)    Hgb urine dipstick LARGE (*)    Ketones, ur TRACE (*)    Protein, ur 30 (*)    All other components within normal limits  URINE MICROSCOPIC-ADD ON - Abnormal; Notable for the following:    Squamous Epithelial / LPF MANY (*)    Bacteria, UA FEW (*)    All other components within normal limits  URINE CULTURE   Imaging Review No results found.  EKG Interpretation   None       MDM   1. URI (upper respiratory infection)     Chest x-ray; no acute changes, unremarkable. URI symptoms. Increase fluids at home and albuterol to take home with instructions on usage. Aferbrile and well-appearing here. No wheezing, chest pain or shortness of breath. Urine; cloudy, no leuks, no nitrites. No antibiotic at this time. Return if symptoms worsen or if fever. Return precautions explained. Pt understands and agrees to plan. Stable for discharge.     Irish Elders, NP 06/21/13 1529

## 2013-06-15 NOTE — ED Notes (Signed)
Pt reports painful urination for 3 days, and cough and congestion for 2 days. Denies any vaginal discharge, ?fever at times.

## 2013-06-17 LAB — URINE CULTURE

## 2013-06-22 NOTE — ED Provider Notes (Signed)
Medical screening examination/treatment/procedure(s) were performed by non-physician practitioner and as supervising physician I was immediately available for consultation/collaboration.  EKG Interpretation   None         Joya Gaskins, MD 06/22/13 [phone]

## 2013-08-06 ENCOUNTER — Encounter (HOSPITAL_COMMUNITY): Payer: Self-pay | Admitting: Emergency Medicine

## 2013-08-06 ENCOUNTER — Emergency Department (HOSPITAL_COMMUNITY)
Admission: EM | Admit: 2013-08-06 | Discharge: 2013-08-06 | Disposition: A | Discharge status: Home / Self Care | Payer: Medicaid Other | Attending: Emergency Medicine | Admitting: Emergency Medicine

## 2013-08-06 ENCOUNTER — Emergency Department (HOSPITAL_COMMUNITY): Payer: Medicaid Other

## 2013-08-06 DIAGNOSIS — R110 Nausea: Secondary | ICD-10-CM | POA: Insufficient documentation

## 2013-08-06 DIAGNOSIS — Z8742 Personal history of other diseases of the female genital tract: Secondary | ICD-10-CM | POA: Insufficient documentation

## 2013-08-06 DIAGNOSIS — Z9889 Other specified postprocedural states: Secondary | ICD-10-CM | POA: Insufficient documentation

## 2013-08-06 DIAGNOSIS — K625 Hemorrhage of anus and rectum: Secondary | ICD-10-CM | POA: Insufficient documentation

## 2013-08-06 DIAGNOSIS — Z8659 Personal history of other mental and behavioral disorders: Secondary | ICD-10-CM | POA: Insufficient documentation

## 2013-08-06 DIAGNOSIS — F17200 Nicotine dependence, unspecified, uncomplicated: Secondary | ICD-10-CM | POA: Insufficient documentation

## 2013-08-06 DIAGNOSIS — G8929 Other chronic pain: Secondary | ICD-10-CM | POA: Insufficient documentation

## 2013-08-06 DIAGNOSIS — R509 Fever, unspecified: Secondary | ICD-10-CM | POA: Insufficient documentation

## 2013-08-06 DIAGNOSIS — R1012 Left upper quadrant pain: Secondary | ICD-10-CM | POA: Insufficient documentation

## 2013-08-06 DIAGNOSIS — Z8739 Personal history of other diseases of the musculoskeletal system and connective tissue: Secondary | ICD-10-CM | POA: Insufficient documentation

## 2013-08-06 DIAGNOSIS — R1013 Epigastric pain: Secondary | ICD-10-CM | POA: Insufficient documentation

## 2013-08-06 DIAGNOSIS — Z3202 Encounter for pregnancy test, result negative: Secondary | ICD-10-CM | POA: Insufficient documentation

## 2013-08-06 DIAGNOSIS — R5383 Other fatigue: Secondary | ICD-10-CM

## 2013-08-06 DIAGNOSIS — K5900 Constipation, unspecified: Secondary | ICD-10-CM | POA: Insufficient documentation

## 2013-08-06 DIAGNOSIS — Z90710 Acquired absence of both cervix and uterus: Secondary | ICD-10-CM | POA: Insufficient documentation

## 2013-08-06 DIAGNOSIS — R5381 Other malaise: Secondary | ICD-10-CM | POA: Insufficient documentation

## 2013-08-06 DIAGNOSIS — N390 Urinary tract infection, site not specified: Secondary | ICD-10-CM | POA: Insufficient documentation

## 2013-08-06 DIAGNOSIS — Z87442 Personal history of urinary calculi: Secondary | ICD-10-CM | POA: Insufficient documentation

## 2013-08-06 LAB — CBC WITH DIFFERENTIAL/PLATELET
BASOS ABS: 0 10*3/uL (ref 0.0–0.1)
BASOS PCT: 0 % (ref 0–1)
Eosinophils Absolute: 0.1 10*3/uL (ref 0.0–0.7)
Eosinophils Relative: 1 % (ref 0–5)
HCT: 40.2 % (ref 36.0–46.0)
Hemoglobin: 13.9 g/dL (ref 12.0–15.0)
LYMPHS PCT: 33 % (ref 12–46)
Lymphs Abs: 2 10*3/uL (ref 0.7–4.0)
MCH: 31.4 pg (ref 26.0–34.0)
MCHC: 34.6 g/dL (ref 30.0–36.0)
MCV: 91 fL (ref 78.0–100.0)
MONOS PCT: 6 % (ref 3–12)
Monocytes Absolute: 0.4 10*3/uL (ref 0.1–1.0)
NEUTROS PCT: 60 % (ref 43–77)
Neutro Abs: 3.7 10*3/uL (ref 1.7–7.7)
Platelets: 151 10*3/uL (ref 150–400)
RBC: 4.42 MIL/uL (ref 3.87–5.11)
RDW: 12.7 % (ref 11.5–15.5)
WBC: 6.1 10*3/uL (ref 4.0–10.5)

## 2013-08-06 LAB — URINALYSIS, ROUTINE W REFLEX MICROSCOPIC
BILIRUBIN URINE: NEGATIVE
Glucose, UA: NEGATIVE mg/dL
KETONES UR: NEGATIVE mg/dL
NITRITE: NEGATIVE
PH: 6 (ref 5.0–8.0)
PROTEIN: NEGATIVE mg/dL
Specific Gravity, Urine: 1.015 (ref 1.005–1.030)
UROBILINOGEN UA: 0.2 mg/dL (ref 0.0–1.0)

## 2013-08-06 LAB — BASIC METABOLIC PANEL
BUN: 9 mg/dL (ref 6–23)
CO2: 26 mEq/L (ref 19–32)
Calcium: 8.6 mg/dL (ref 8.4–10.5)
Chloride: 103 mEq/L (ref 96–112)
Creatinine, Ser: 0.63 mg/dL (ref 0.50–1.10)
Glucose, Bld: 86 mg/dL (ref 70–99)
POTASSIUM: 4 meq/L (ref 3.7–5.3)
Sodium: 138 mEq/L (ref 137–147)

## 2013-08-06 LAB — URINE MICROSCOPIC-ADD ON

## 2013-08-06 LAB — PREGNANCY, URINE: Preg Test, Ur: NEGATIVE

## 2013-08-06 LAB — OCCULT BLOOD, POC DEVICE: Fecal Occult Bld: NEGATIVE

## 2013-08-06 LAB — LIPASE, BLOOD: LIPASE: 26 U/L (ref 11–59)

## 2013-08-06 MED ORDER — ESOMEPRAZOLE MAGNESIUM 40 MG PO CPDR: 40.0000 mg | DELAYED_RELEASE_CAPSULE | Freq: Every day | ORAL | Status: DC

## 2013-08-06 MED ORDER — KETOROLAC TROMETHAMINE 60 MG/2ML IM SOLN
60.0000 mg | Freq: Once | INTRAMUSCULAR | Status: AC
  Administered 2013-08-06: 60 mg via INTRAMUSCULAR
  Filled 2013-08-06: qty 2

## 2013-08-06 MED ORDER — ONDANSETRON 8 MG PO TBDP
8.0000 mg | ORAL_TABLET | Freq: Once | ORAL | Status: AC
  Administered 2013-08-06: 8 mg via ORAL
  Filled 2013-08-06: qty 1

## 2013-08-06 MED ORDER — FAMOTIDINE 20 MG PO TABS
20.0000 mg | ORAL_TABLET | Freq: Once | ORAL | Status: AC
  Administered 2013-08-06: 20 mg via ORAL
  Filled 2013-08-06: qty 1

## 2013-08-06 MED ORDER — CEPHALEXIN 500 MG PO CAPS: 500.0000 mg | ORAL_CAPSULE | Freq: Four times a day (QID) | ORAL | Status: DC

## 2013-08-06 MED ORDER — DOCUSATE SODIUM 250 MG PO CAPS: 250.0000 mg | ORAL_CAPSULE | Freq: Every day | ORAL | Status: DC

## 2013-08-06 NOTE — ED Provider Notes (Signed)
CSN: [id number]     Arrival date & time 08/06/13  1035 History   First MD Initiated Contact with Patient 08/06/13 1233     Chief Complaint  Patient presents with  . Abdominal Pain  . Rectal Bleeding     (Consider location/radiation/quality/duration/timing/severity/associated sxs/prior Treatment) Patient is a 33 y.o. female presenting with abdominal pain. The history is provided by the patient.  Abdominal Pain Pain location:  Epigastric Pain quality: aching   Pain radiates to:  Does not radiate Pain severity:  Mild Onset quality:  Gradual Timing:  Intermittent Progression:  Worsening Chronicity:  New Context: eating   Context: not previous surgeries, not recent illness, not sick contacts, not suspicious food intake and not trauma   Relieved by: bd rest and being still. Worsened by:  Eating Ineffective treatments:  None tried Associated symptoms: constipation, dysuria, fatigue and nausea   Associated symptoms: no belching, no chest pain, no chills, no cough, no diarrhea, no fever, no flatus, no hematuria, no melena, no shortness of breath, no sore throat, no vaginal bleeding, no vaginal discharge and no vomiting   Associated symptoms comment:  One episode of bright red blood from her rectum after trying to have a BM.   Dysuria:    Severity:  Mild   Onset quality:  Gradual   Duration:  2 days   Timing:  Sporadic   Progression:  Waxing and waning   Chronicity:  New Fatigue:    Severity:  Mild   Timing:  Intermittent   Progression:  Unchanged   Patient c/o intermittent upper abdominal pain that began 3 days ago.  Was associated with fever at that time, but denies continued fever.  She also c/o nausea, and dysuria.  She states the abdominal pain is worse with eating.  She also states that she has been constipated and tried to have a bowel movement this morning and had one episode of bright red blood from her rectum after having a small amt of hard stool.  She denies vaginal  bleeding, discharge, new sexual partners, chest pain or vomiting.    Past Medical History  Diagnosis Date  . Fibromyalgia   . Chronic back pain   . Headache(784.0)   . Endometriosis   . Ovarian cyst   . Suicide attempt   . Chronic pelvic pain in female   . Kidney stone   . Anxiety   . Depression   . Interstitial cystitis    Past Surgical History  Procedure Laterality Date  . Abdominal hysterectomy    . Hernia repair     Family History  Problem Relation Age of Onset  . Hypertension Mother   . Hypertension Father   . Muscular dystrophy Brother    History  Substance Use Topics  . Smoking status: Current Every Day Smoker -- 0.50 packs/day for 11 years    Types: Cigarettes  . Smokeless tobacco: Never Used  . Alcohol Use: No   OB History   Grav Para Term Preterm Abortions TAB SAB Ect Mult Living   3 2 2  1  1   2      Review of Systems  Constitutional: Positive for fatigue. Negative for fever, chills and appetite change.  HENT: Negative for sore throat.   Respiratory: Negative for cough, chest tightness and shortness of breath.   Cardiovascular: Negative for chest pain.  Gastrointestinal: Positive for nausea, abdominal pain, constipation and blood in stool. Negative for vomiting, diarrhea, melena, abdominal distention and flatus.  Genitourinary: Positive  for dysuria. Negative for frequency, hematuria, flank pain, decreased urine volume, vaginal bleeding, vaginal discharge, difficulty urinating and pelvic pain.  Musculoskeletal: Negative for back pain and myalgias.  Skin: Negative for color change and rash.  Neurological: Negative for dizziness, syncope, weakness and numbness.  Hematological: Negative for adenopathy.  All other systems reviewed and are negative.      Allergies  Ciprofloxacin  Home Medications  No current outpatient prescriptions on file. BP 112/69  Pulse 70  Temp(Src) 98 F (36.7 C) (Oral)  Resp 18  Ht 5\' 2"  (1.575 m)  Wt 100 lb (45.36 kg)   BMI 18.29 kg/m2  SpO2 96% Physical Exam  Nursing note and vitals reviewed. Constitutional: She is oriented to person, place, and time. She appears well-developed and well-nourished. No distress.  HENT:  Head: Normocephalic and atraumatic.  Mouth/Throat: Oropharynx is clear and moist.  Neck: Normal range of motion. Neck supple.  Cardiovascular: Normal rate, regular rhythm, normal heart sounds and intact distal pulses.   No murmur heard. Pulmonary/Chest: Effort normal and breath sounds normal. No respiratory distress. She exhibits no tenderness.  Abdominal: Soft. Normal appearance and bowel sounds are normal. She exhibits no distension and no mass. There is no hepatosplenomegaly. There is tenderness in the epigastric area and left upper quadrant. There is no rebound, no guarding and no CVA tenderness.    Musculoskeletal: Normal range of motion.  Lymphadenopathy:    She has no cervical adenopathy.  Neurological: She is alert and oriented to person, place, and time. She exhibits normal muscle tone. Coordination normal.  Skin: Skin is warm and dry.    ED Course  Procedures (including critical care time) Labs Review Labs Reviewed  URINALYSIS, ROUTINE W REFLEX MICROSCOPIC - Abnormal; Notable for the following:    Hgb urine dipstick LARGE (*)    Leukocytes, UA TRACE (*)    All other components within normal limits  URINE MICROSCOPIC-ADD ON - Abnormal; Notable for the following:    Squamous Epithelial / LPF MANY (*)    All other components within normal limits  URINE CULTURE  PREGNANCY, URINE  BASIC METABOLIC PANEL  CBC WITH DIFFERENTIAL  LIPASE, BLOOD  OCCULT BLOOD, POC DEVICE   Imaging Review US Abdomen Complete  08/06/2013   CLINICAL DATA:  Abdominal pain  EXAM: ULTRASOUND ABDOMEN COMPLETE  COMPARISON:  CT abdomen and pelvis August 24, 2012  FINDINGS: Gallbladder:  No gallstones or wall thickening visualized. There is no pericholecystic fluid. No sonographic Murphy sign noted.   Common bile duct:  Diameter: 2 mm. There is no intrahepatic, common hepatic, or common bile duct dilatation.  Liver:  No focal lesion identified. Within normal limits in parenchymal echogenicity.  IVC:  No abnormality visualized.  Pancreas:  No mass or inflammatory focus.  Spleen:  Size and appearance within normal limits.  Right Kidney:  Length: 11.1 cm. Echogenicity within normal limits. No mass or hydronephrosis visualized.  Left Kidney:  Length: 12.1 cm. Echogenicity within normal limits. No hydronephrosis visualized. There is a cyst in the mid left kidney measuring 1.0 x 1.1 x 1.0 cm. No other renal mass is identified.  Abdominal aorta:  No aneurysm visualized.  Other findings:  No demonstrable ascites.  IMPRESSION: Small cyst in mid left kidney.  Study otherwise unremarkable.   Electronically Signed   By: Lowella Grip M.D.   On: 08/06/2013 16:13   Dg Abd Acute W/chest  08/06/2013   CLINICAL DATA:  Left upper quadrant pain.  Rectal bleeding.  EXAM: ACUTE  ABDOMEN SERIES (ABDOMEN 2 VIEW & CHEST 1 VIEW)  COMPARISON:  05/1913  FINDINGS: There is no evidence of dilated bowel loops or free intraperitoneal air. No radiopaque calculi or other significant radiographic abnormality is seen. Heart size and mediastinal contours are within normal limits. Both lungs are clear.  IMPRESSION: Negative abdominal radiographs.  No acute cardiopulmonary disease.   Electronically Signed   By: Lajean Manes M.D.   On: 08/06/2013 14:13    EKG Interpretation   None       MDM   Final diagnoses:  Epigastric pain  Urinary tract infection    Urine culture is pending.  Patient is feeling better after medications.  No vomiting during ED stay.  No concerning sx's for acute abd.  [place] and labs reviewed and discussed with the patient.  She agrees to keflex, nexium and colace as needed.  VSS.  She appears stable for discharge and agrees to f/u with her PMD.  Restrict return precautions also given.    Shizuko Wojdyla L. Vanessa Sharon,  PA-C 08/07/13 [id number]

## 2013-08-06 NOTE — ED Notes (Addendum)
Patient c/o pain in left upper abd with nausea, fatigue and pain with urination. Denies any discharge. Patient also reports bright red and dark blood in stool this morning. Denies any diarrhea. Reports fever 100.3 in which she took ibuprofen for yesterday.

## 2013-08-06 NOTE — Discharge Instructions (Signed)
Abdominal Pain, Adult Many things can cause belly (abdominal) pain. Most times, the belly pain is not dangerous. Many cases of belly pain can be watched and treated at home. HOME CARE   Do not take medicines that help you go poop (laxatives) unless told to by your doctor.  Only take medicine as told by your doctor.  Eat or drink as told by your doctor. Your doctor will tell you if you should be on a special diet. GET HELP IF:  You do not know what is causing your belly pain.  You have belly pain while you are sick to your stomach (nauseous) or have runny poop (diarrhea).  You have pain while you pee or poop.  Your belly pain wakes you up at night.  You have belly pain that gets worse or better when you eat.  You have belly pain that gets worse when you eat fatty foods. GET HELP RIGHT AWAY IF:   The pain does not go away within 2 hours.  You have a fever.  You keep throwing up (vomiting).  The pain changes and is only in the right or left part of the belly.  You have bloody or tarry looking poop. MAKE SURE YOU:   Understand these instructions.  Will watch your condition.  Will get help right away if you are not doing well or get worse. Document Released: 12/01/2007 Document Revised: 04/04/2013 Document Reviewed: 02/21/2013 [facility] Patient Information 2014 [place].  Urinary Tract Infection A urinary tract infection (UTI) can occur any place along the urinary tract. The tract includes the kidneys, ureters, bladder, and urethra. A type of germ called bacteria often causes a UTI. UTIs are often helped with antibiotic medicine.  HOME CARE   If given, take antibiotics as told by your doctor. Finish them even if you start to feel better.  Drink enough fluids to keep your pee (urine) clear or pale yellow.  Avoid tea, drinks with caffeine, and bubbly (carbonated) drinks.  Pee often. Avoid holding your pee in for a long time.  Pee before and after having sex  (intercourse).  Wipe from front to back after you poop (bowel movement) if you are a woman. Use each tissue only once. GET HELP RIGHT AWAY IF:   You have back pain.  You have lower belly (abdominal) pain.  You have chills.  You feel sick to your stomach (nauseous).  You throw up (vomit).  Your burning or discomfort with peeing does not go away.  You have a fever.  Your symptoms are not better in 3 days. MAKE SURE YOU:   Understand these instructions.  Will watch your condition.  Will get help right away if you are not doing well or get worse. Document Released: 12/01/2007 Document Revised: 03/08/2012 Document Reviewed: 01/13/2012 [facility] Patient Information 2014 [place], [place].

## 2013-08-08 LAB — URINE CULTURE: Colony Count: >=100000

## 2013-08-08 NOTE — ED Provider Notes (Signed)
Medical screening examination/treatment/procedure(s) were performed by non-physician practitioner and as supervising physician I was immediately available for consultation/collaboration.  EKG Interpretation   None         Alfonzo Feller, DO 08/08/13 [phone]

## 2013-08-23 ENCOUNTER — Ambulatory Visit: Payer: Medicaid Other | Admitting: Physician Assistant

## 2013-08-29 ENCOUNTER — Ambulatory Visit: Payer: Medicaid Other | Admitting: Physician Assistant

## 2013-10-03 ENCOUNTER — Emergency Department (HOSPITAL_COMMUNITY)
Admission: EM | Admit: 2013-10-03 | Discharge: 2013-10-03 | Disposition: A | Discharge status: Home / Self Care | Payer: Medicaid Other | Attending: Emergency Medicine | Admitting: Emergency Medicine

## 2013-10-03 ENCOUNTER — Encounter (HOSPITAL_COMMUNITY): Payer: Self-pay | Admitting: Emergency Medicine

## 2013-10-03 DIAGNOSIS — F17200 Nicotine dependence, unspecified, uncomplicated: Secondary | ICD-10-CM | POA: Insufficient documentation

## 2013-10-03 DIAGNOSIS — Z79899 Other long term (current) drug therapy: Secondary | ICD-10-CM | POA: Insufficient documentation

## 2013-10-03 DIAGNOSIS — Z87442 Personal history of urinary calculi: Secondary | ICD-10-CM | POA: Insufficient documentation

## 2013-10-03 DIAGNOSIS — Z8739 Personal history of other diseases of the musculoskeletal system and connective tissue: Secondary | ICD-10-CM | POA: Insufficient documentation

## 2013-10-03 DIAGNOSIS — Z8659 Personal history of other mental and behavioral disorders: Secondary | ICD-10-CM | POA: Insufficient documentation

## 2013-10-03 DIAGNOSIS — Z87448 Personal history of other diseases of urinary system: Secondary | ICD-10-CM | POA: Insufficient documentation

## 2013-10-03 DIAGNOSIS — S0500XA Injury of conjunctiva and corneal abrasion without foreign body, unspecified eye, initial encounter: Secondary | ICD-10-CM

## 2013-10-03 DIAGNOSIS — Y93G3 Activity, cooking and baking: Secondary | ICD-10-CM | POA: Insufficient documentation

## 2013-10-03 DIAGNOSIS — X131XXA Other contact with steam and other hot vapors, initial encounter: Secondary | ICD-10-CM

## 2013-10-03 DIAGNOSIS — G8929 Other chronic pain: Secondary | ICD-10-CM | POA: Insufficient documentation

## 2013-10-03 DIAGNOSIS — Z8742 Personal history of other diseases of the female genital tract: Secondary | ICD-10-CM | POA: Insufficient documentation

## 2013-10-03 DIAGNOSIS — Y9289 Other specified places as the place of occurrence of the external cause: Secondary | ICD-10-CM | POA: Insufficient documentation

## 2013-10-03 DIAGNOSIS — X12XXXA Contact with other hot fluids, initial encounter: Secondary | ICD-10-CM | POA: Insufficient documentation

## 2013-10-03 DIAGNOSIS — S058X9A Other injuries of unspecified eye and orbit, initial encounter: Secondary | ICD-10-CM | POA: Insufficient documentation

## 2013-10-03 MED ORDER — FLUORESCEIN SODIUM 1 MG OP STRP
ORAL_STRIP | OPHTHALMIC | Status: AC
  Filled 2013-10-03: qty 1

## 2013-10-03 MED ORDER — HYDROCODONE-ACETAMINOPHEN 5-325 MG PO TABS: 2.0000 | ORAL_TABLET | ORAL | Status: DC | PRN

## 2013-10-03 MED ORDER — TETRACAINE HCL 0.5 % OP SOLN
OPHTHALMIC | Status: AC
  Filled 2013-10-03: qty 2

## 2013-10-03 MED ORDER — GENTAMICIN SULFATE 0.3 % OP SOLN: 2.0000 [drp] | OPHTHALMIC | Status: DC

## 2013-10-03 NOTE — ED Notes (Signed)
Pt states she was frying chicken last night a grease popped into right eye. Pt states severe burning to right eye.

## 2013-10-03 NOTE — Discharge Instructions (Signed)
Gentamycin eye drops as prescribed.  Hydrocodone as needed for pain.  Return to the ER if you develop worsening pain, vision, or other new or concerning symptoms.   Corneal Abrasion The cornea is the clear covering at the front and center of the eye. When looking at the colored portion of the eye (iris), you are looking through the cornea. This very thin tissue is made up of many layers. The surface layer is a single layer of cells (corneal epithelium) and is one of the most sensitive tissues in the body. If a scratch or injury causes the corneal epithelium to come off, it is called a corneal abrasion. If the injury extends to the tissues below the epithelium, the condition is called a corneal ulcer. CAUSES   Scratches.  Trauma.  Foreign body in the eye. Some people have recurrences of abrasions in the area of the original injury even after it has healed (recurrent erosion syndrome). Recurrent erosion syndrome generally improves and goes away with time. SYMPTOMS   Eye pain.  Difficulty or inability to keep the injured eye open.  The eye becomes very sensitive to light.  Recurrent erosions tend to happen suddenly, first thing in the morning, usually after waking up and opening the eye. DIAGNOSIS  Your health care provider can diagnose a corneal abrasion during an eye exam. Dye is usually placed in the eye using a drop or a small paper strip moistened by your tears. When the eye is examined with a special light, the abrasion shows up clearly because of the dye. TREATMENT   Small abrasions may be treated with antibiotic drops or ointment alone.  Usually a pressure patch is specially applied. Pressure patches prevent the eye from blinking, allowing the corneal epithelium to heal. A pressure patch also reduces the amount of pain present in the eye during healing. Most corneal abrasions heal within 2 3 days with no effect on vision. If the abrasion becomes infected and spreads to the  deeper tissues of the cornea, a corneal ulcer can result. This is serious because it can cause corneal scarring. Corneal scars interfere with light passing through the cornea and cause a loss of vision in the involved eye. HOME CARE INSTRUCTIONS  Use medicine or ointment as directed. Only take over-the-counter or prescription medicines for pain, discomfort, or fever as directed by your health care provider.  Do not drive or operate machinery while your eye is patched. Your ability to judge distances is impaired.  If your health care provider has given you a follow-up appointment, it is very important to keep that appointment. Not keeping the appointment could result in a severe eye infection or permanent loss of vision. If there is any problem keeping the appointment, let your health care provider know. SEEK MEDICAL CARE IF:   You have pain, light sensitivity, and a scratchy feeling in one eye or both eyes.  Your pressure patch keeps loosening up, and you can blink your eye under the patch after treatment.  Any kind of discharge develops from the eye after treatment or if the lids stick together in the morning.  You have the same symptoms in the morning as you did with the original abrasion days, weeks, or months after the abrasion healed. MAKE SURE YOU:   Understand these instructions.  Will watch your condition.  Will get help right away if you are not doing well or get worse. Document Released: 06/11/2000 Document Revised: 04/04/2013 Document Reviewed: 02/19/2013 [facility] Patient Information 2014 [employer]. ° °

## 2013-10-03 NOTE — ED Provider Notes (Signed)
CSN: [id number]     Arrival date & time 10/03/13  8101 History  This chart was scribed for Veryl Speak, MD by Zettie Pho, ED Scribe. This patient was seen in room APA04/APA04 and the patient's care was started at 7:33 AM.    Chief Complaint  Patient presents with  . Eye Injury   The history is provided by the patient. No language interpreter was used.   HPI Comments: Susan Gross is a 33 y.o. female who presents to the Emergency Department complaining of an injury to the right eye that she sustained last night, around 10 hours ago, by having hot grease "pop" into the eye. Patient is complaining of a constant, burning pain with associated redness and blurred vision to the eye secondary to the injury. She states that the pain is exacerbated with opening the eye. Patient reports using Visine eye drops at home with minimal relief and applying ice to the area without significant relief. Patient does not wear glasses or contacts. Patient has allergies to ciprofloxacin. Patient has a history of fibromyalgia, interstitial cystitis, endometriosis, and ovarian cyst.   PCP- Dr. Dena Billet  Past Medical History  Diagnosis Date  . Fibromyalgia   . Chronic back pain   . Headache(784.0)   . Endometriosis   . Ovarian cyst   . Suicide attempt   . Chronic pelvic pain in female   . Kidney stone   . Anxiety   . Depression   . Interstitial cystitis    Past Surgical History  Procedure Laterality Date  . Abdominal hysterectomy    . Hernia repair     Family History  Problem Relation Age of Onset  . Hypertension Mother   . Hypertension Father   . Muscular dystrophy Brother    History  Substance Use Topics  . Smoking status: Current Every Day Smoker -- 0.50 packs/day for 11 years    Types: Cigarettes  . Smokeless tobacco: Never Used  . Alcohol Use: No   OB History   Grav Para Term Preterm Abortions TAB SAB Ect Mult Living   3 2 2  1  1   2      Review of Systems  A complete 10 system  review of systems was obtained and all systems are negative except as noted in the HPI and PMH.    Allergies  Ciprofloxacin  Home Medications   Current Outpatient Rx  Name  Route  Sig  Dispense  Refill  . cephALEXin (KEFLEX) 500 MG capsule   Oral   Take 1 capsule (500 mg total) by mouth 4 (four) times daily. For 7 days   28 capsule   0   . docusate sodium (COLACE) 250 MG capsule   Oral   Take 1 capsule (250 mg total) by mouth daily.   10 capsule   0   . esomeprazole (NEXIUM) 40 MG capsule   Oral   Take 1 capsule (40 mg total) by mouth daily.   20 capsule   0    Triage Vitals: BP 143/89  Pulse 87  Temp(Src) 98.4 F (36.9 C) (Oral)  Resp 16  SpO2 100%  Physical Exam  Nursing note and vitals reviewed. Constitutional: She is oriented to person, place, and time. She appears well-developed and well-nourished. No distress.  HENT:  Head: Normocephalic and atraumatic.  Eyes: Conjunctivae and EOM are normal. Pupils are equal, round, and reactive to light.  Right conjunctiva is injected. There is a centrally located, superficial abrasion noted  with fluorescein. No anterior chamber flare.   Neck: Normal range of motion. Neck supple.  Pulmonary/Chest: Effort normal. No respiratory distress.  Abdominal: She exhibits no distension.  Musculoskeletal: Normal range of motion.  Neurological: She is alert and oriented to person, place, and time.  Skin: Skin is warm and dry.  Psychiatric: She has a normal mood and affect. Her behavior is normal.    ED Course  Procedures (including critical care time)  DIAGNOSTIC STUDIES: Oxygen Saturation is 100% on room air, normal by my interpretation.    COORDINATION OF CARE: 7:40 AM- Ordered tetracaine to manage symptoms. Applied fluorescein to further evaluate the eye. Discussed that symptoms are due to a small, superficial burn to the area, which will heal on its own in a few days. Will discharge patient with antibiotic eye drops and pain  medication to manage symptoms. Return precautions given. Discussed treatment plan with patient at bedside and patient verbalized agreement.     Labs Review Labs Reviewed - No data to display Imaging Review No results found.   EKG Interpretation None      MDM   Final diagnoses:  None    Patient has an abrasion to the cornea from a grease burn while cooking chicken last night. We'll treat with gentamicin eyedrops and pain medication. She is to return as needed.  I personally performed the services described in this documentation, which was scribed in my presence. The recorded information has been reviewed and is accurate.       Veryl Speak, MD 10/03/13 1538

## 2013-10-03 NOTE — ED Notes (Signed)
Pt reports was frying chicken last night and grease popped up and got in her r eye.  Eye red.  Pt c/o severe burning.  Pt unable to tolerate opening eye long enough to complete visual acuity.  Pt holding ice on eye.

## 2013-11-29 ENCOUNTER — Emergency Department (HOSPITAL_COMMUNITY)
Admission: EM | Admit: 2013-11-29 | Discharge: 2013-11-29 | Disposition: A | Discharge status: Home / Self Care | Payer: Medicaid Other | Attending: Emergency Medicine | Admitting: Emergency Medicine

## 2013-11-29 ENCOUNTER — Encounter (HOSPITAL_COMMUNITY): Payer: Self-pay | Admitting: Emergency Medicine

## 2013-11-29 DIAGNOSIS — Z8742 Personal history of other diseases of the female genital tract: Secondary | ICD-10-CM | POA: Insufficient documentation

## 2013-11-29 DIAGNOSIS — Z87448 Personal history of other diseases of urinary system: Secondary | ICD-10-CM | POA: Insufficient documentation

## 2013-11-29 DIAGNOSIS — F17200 Nicotine dependence, unspecified, uncomplicated: Secondary | ICD-10-CM | POA: Insufficient documentation

## 2013-11-29 DIAGNOSIS — M436 Torticollis: Secondary | ICD-10-CM

## 2013-11-29 DIAGNOSIS — G8929 Other chronic pain: Secondary | ICD-10-CM | POA: Insufficient documentation

## 2013-11-29 DIAGNOSIS — Z8659 Personal history of other mental and behavioral disorders: Secondary | ICD-10-CM | POA: Insufficient documentation

## 2013-11-29 DIAGNOSIS — Z87442 Personal history of urinary calculi: Secondary | ICD-10-CM | POA: Insufficient documentation

## 2013-11-29 MED ORDER — HYDROCODONE-ACETAMINOPHEN 5-325 MG PO TABS
1.0000 | ORAL_TABLET | Freq: Once | ORAL | Status: AC
Start: 2013-11-29 — End: 2013-11-29
  Administered 2013-11-29: 1 via ORAL
  Filled 2013-11-29: qty 1

## 2013-11-29 MED ORDER — HYDROCODONE-ACETAMINOPHEN 5-325 MG PO TABS: ORAL_TABLET | ORAL | Status: DC

## 2013-11-29 MED ORDER — CYCLOBENZAPRINE HCL 10 MG PO TABS: 10.0000 mg | ORAL_TABLET | Freq: Three times a day (TID) | ORAL | Status: DC | PRN

## 2013-11-29 MED ORDER — CYCLOBENZAPRINE HCL 10 MG PO TABS
10.0000 mg | ORAL_TABLET | Freq: Once | ORAL | Status: AC
  Administered 2013-11-29: 10 mg via ORAL
  Filled 2013-11-29: qty 1

## 2013-11-29 NOTE — ED Provider Notes (Signed)
Medical screening examination/treatment/procedure(s) were performed by non-physician practitioner and as supervising physician I was immediately available for consultation/collaboration.  Richarda Blade, MD 11/29/13 [phone]

## 2013-11-29 NOTE — ED Provider Notes (Signed)
CSN: [id number]     Arrival date & time 11/29/13  1020 History   First MD Initiated Contact with Patient 11/29/13 1112     Chief Complaint  Patient presents with  . Torticollis     (Consider location/radiation/quality/duration/timing/severity/associated sxs/prior Treatment) HPI Comments: Susan Gross is a 33 y.o. female who presents to the Emergency Department complaining of gradual onset of right neck pain for 4 days. She states pain began after doing housework. She states that she woke up later that evening with pain to her right neck that radiates into her shoulder and upper right arm. Pain is worse with movement of the arm or rotation of her neck.  She has been taking ibuprofen without relief. She states the pain improves when her right arm is held close to her body and she avoids rotation of her neck. She denies headache, dizziness, chest pain, shortness of breath, fever chills, numbness or tingling, or vomiting.  The history is provided by the patient.    Past Medical History  Diagnosis Date  . Fibromyalgia   . Chronic back pain   . Headache(784.0)   . Endometriosis   . Ovarian cyst   . Suicide attempt   . Chronic pelvic pain in female   . Kidney stone   . Anxiety   . Depression   . Interstitial cystitis    Past Surgical History  Procedure Laterality Date  . Abdominal hysterectomy    . Hernia repair     Family History  Problem Relation Age of Onset  . Hypertension Mother   . Hypertension Father   . Muscular dystrophy Brother    History  Substance Use Topics  . Smoking status: Current Every Day Smoker -- 0.50 packs/day for 11 years    Types: Cigarettes  . Smokeless tobacco: Never Used  . Alcohol Use: No   OB History   Grav Para Term Preterm Abortions TAB SAB Ect Mult Living   3 2 2  1  1   2      Review of Systems  Constitutional: Negative for fever and chills.  Eyes: Negative for visual disturbance.  Respiratory: Negative for chest tightness and shortness  of breath.   Cardiovascular: Negative for chest pain.  Gastrointestinal: Negative for nausea, vomiting and abdominal pain.  Genitourinary: Negative for dysuria and difficulty urinating.  Musculoskeletal: Positive for neck pain and neck stiffness. Negative for arthralgias, gait problem and joint swelling.  Skin: Negative for color change and wound.  Neurological: Negative for dizziness, syncope, facial asymmetry, speech difficulty, weakness, light-headedness, numbness and headaches.  All other systems reviewed and are negative.     Allergies  Ciprofloxacin  Home Medications   Prior to Admission medications   Medication Sig Start Date End Date Taking? Authorizing Provider  ibuprofen (ADVIL,MOTRIN) 200 MG tablet Take 800 mg by mouth every 6 (six) hours as needed. pain   Yes Historical Provider, MD   BP 154/96  Pulse 91  Temp(Src) 98.7 F (37.1 C) (Oral)  Resp 20  Ht 5\' 2"  (1.575 m)  Wt 100 lb (45.36 kg)  BMI 18.29 kg/m2  SpO2 100% Physical Exam  Nursing note and vitals reviewed. Constitutional: She is oriented to person, place, and time. She appears well-developed and well-nourished. No distress.  HENT:  Head: Normocephalic and atraumatic.  Mouth/Throat: Oropharynx is clear and moist.  Eyes: EOM are normal. Pupils are equal, round, and reactive to light.  Neck: Phonation normal. Muscular tenderness present. No spinous process tenderness present. No rigidity.  Decreased range of motion present. No erythema present. No Brudzinski's sign and no Kernig's sign noted. No thyromegaly present.  ttp of the right cervical paraspinal muscles and along the right trapezius muscle.  Grip strength is strong and equal bilaterally.  Distal sensation intact,  CR < 2 sec.  Pain to the right neck is reproduced with rotation and palpation.    Cardiovascular: Normal rate, regular rhythm, normal heart sounds and intact distal pulses.   No murmur heard. Pulmonary/Chest: Effort normal and breath sounds  normal. No respiratory distress. She exhibits no tenderness.  Musculoskeletal: She exhibits tenderness. She exhibits no edema.       Right elbow: She exhibits normal range of motion, no swelling and no effusion. No tenderness found.       Cervical back: She exhibits tenderness. She exhibits normal range of motion, no bony tenderness, no swelling, no deformity, no spasm and normal pulse.       Right hand: She exhibits normal range of motion, no tenderness, normal capillary refill and no swelling. Normal sensation noted. Normal strength noted.  See neck exam  Lymphadenopathy:    She has no cervical adenopathy.  Neurological: She is alert and oriented to person, place, and time. She has normal strength. No sensory deficit. She exhibits normal muscle tone. Coordination normal.  Reflex Scores:      Tricep reflexes are 2+ on the right side and 2+ on the left side.      Bicep reflexes are 2+ on the right side and 2+ on the left side. Skin: Skin is warm and dry.    ED Course  Procedures (including critical care time) Labs Review Labs Reviewed - No data to display  Imaging Review No results found.   EKG Interpretation None      MDM   Final diagnoses:  Torticollis, acute    Patient is well appearing. Vital signs are stable. History of right-sided neck pain after exertion. No focal neuro deficits on exam. No meningeal signs. Symptoms are likely related torticollis. No history of trauma to indicate need for imaging at this time. Patient agrees to symptomatic treatment with Vicodin #12 and Flexeril. Have advised patient to followup with her primary care physician for recheck. Patient agrees to plan and appears stable for discharge.    Alaster Asfaw L. Christobal Morado, PA-C 11/29/13 1138  Jarret Torre L. Dimitrios Balestrieri, PA-C 11/29/13 1139

## 2013-11-29 NOTE — ED Notes (Signed)
Neck pain times 4 days and now its  radiating down right arm and into right temporal area.   Denies any injury.

## 2013-11-29 NOTE — ED Notes (Signed)
PT d/c to home with NAD. 

## 2013-11-29 NOTE — Discharge Instructions (Signed)
Torticollis, Acute °You have suddenly (acutely) developed a twisted neck (torticollis). This is usually a self-limited condition. °CAUSES  °Acute torticollis may be caused by malposition, trauma or infection. Most commonly, acute torticollis is caused by sleeping in an awkward position. Torticollis may also be caused by the flexion, extension or twisting of the neck muscles beyond their normal position. Sometimes, the exact cause may not be known. °SYMPTOMS  °Usually, there is pain and limited movement of the neck. Your neck may twist to one side. °DIAGNOSIS  °The diagnosis is often made by physical examination. X-rays, CT scans or MRIs may be done if there is a history of trauma or concern of infection. °TREATMENT  °For a common, stiff neck that develops during sleep, treatment is focused on relaxing the contracted neck muscle. Medications (including shots) may be used to treat the problem. Most cases resolve in several days. Torticollis usually responds to conservative physical therapy. If left untreated, the shortened and spastic neck muscle can cause deformities in the face and neck. Rarely, surgery is required. °HOME CARE INSTRUCTIONS  °· Use over-the-counter and prescription medications as directed by your caregiver. °· Do stretching exercises and massage the neck as directed by your caregiver. °· Follow up with physical therapy if needed and as directed by your caregiver. °SEEK IMMEDIATE MEDICAL CARE IF:  °· You develop difficulty breathing or noisy breathing (stridor). °· You drool, develop trouble swallowing or have pain with swallowing. °· You develop numbness or weakness in the hands or feet. °· You have changes in speech or vision. °· You have problems with urination or bowel movements. °· You have difficulty walking. °· You have a fever. °· You have increased pain. °MAKE SURE YOU:  °· Understand these instructions. °· Will watch your condition. °· Will get help right away if you are not doing well or  get worse. °Document Released: 06/11/2000 Document Revised: 09/06/2011 Document Reviewed: 07/23/2009 °[employer]® Patient Information ©2014 [employer]. ° °

## 2014-01-03 ENCOUNTER — Emergency Department (HOSPITAL_COMMUNITY)
Admission: EM | Admit: 2014-01-03 | Discharge: 2014-01-03 | Disposition: A | Discharge status: Home / Self Care | Payer: Medicaid Other | Attending: Emergency Medicine | Admitting: Emergency Medicine

## 2014-01-03 ENCOUNTER — Encounter (HOSPITAL_COMMUNITY): Payer: Self-pay | Admitting: Emergency Medicine

## 2014-01-03 DIAGNOSIS — N644 Mastodynia: Secondary | ICD-10-CM | POA: Diagnosis present

## 2014-01-03 DIAGNOSIS — Z8659 Personal history of other mental and behavioral disorders: Secondary | ICD-10-CM | POA: Diagnosis not present

## 2014-01-03 DIAGNOSIS — Z791 Long term (current) use of non-steroidal anti-inflammatories (NSAID): Secondary | ICD-10-CM | POA: Insufficient documentation

## 2014-01-03 DIAGNOSIS — F17200 Nicotine dependence, unspecified, uncomplicated: Secondary | ICD-10-CM | POA: Diagnosis not present

## 2014-01-03 DIAGNOSIS — N63 Unspecified lump in unspecified breast: Secondary | ICD-10-CM

## 2014-01-03 DIAGNOSIS — G8929 Other chronic pain: Secondary | ICD-10-CM | POA: Diagnosis not present

## 2014-01-03 DIAGNOSIS — Z87442 Personal history of urinary calculi: Secondary | ICD-10-CM | POA: Diagnosis not present

## 2014-01-03 MED ORDER — HYDROCODONE-ACETAMINOPHEN 5-325 MG PO TABS: 1.0000 | ORAL_TABLET | ORAL | Status: DC | PRN

## 2014-01-03 NOTE — ED Notes (Signed)
Pt states she felt a knot in her rt breast about 4 weeks ago but now she is having pain in rt breast and it it swollen. Pt states she has never had a mammogram and that she has breast cancer on both sides of her family. Pt is anxious and tearful.

## 2014-01-03 NOTE — ED Notes (Addendum)
Pt states knot to right upper breast which she has seen her PCP for but states knot has gotten larger and is painful. Also states she has previously noticed yellow discharge from nipple of breast.

## 2014-01-03 NOTE — Discharge Instructions (Signed)
As discussed,  You have been scheduled for an Ultrasound this coming Tuesday with our breast clinic radiology specialist in our radiology department.  You will be contacted with your appointment time.  In the interim,  You may take the pain medicine prescribed if needed.  Do not drive within 4 hours of taking this medicine. Continue taking your ibuprofen.

## 2014-01-04 ENCOUNTER — Ambulatory Visit: Payer: Medicaid Other | Admitting: Family Medicine

## 2014-01-04 NOTE — ED Provider Notes (Signed)
Medical screening examination/treatment/procedure(s) were performed by non-physician practitioner and as supervising physician I was immediately available for consultation/collaboration.   EKG Interpretation None        Maudry Diego, MD 01/04/14 [phone]

## 2014-01-04 NOTE — ED Provider Notes (Signed)
CSN: [id number]     Arrival date & time 01/03/14  1215 History   First MD Initiated Contact with Patient 01/03/14 1303     Chief Complaint  Patient presents with  . Breast Pain     (Consider location/radiation/quality/duration/timing/severity/associated sxs/prior Treatment) HPI   Susan Gross is a 33 y.o. female presenting with a painful nodule in her right breast which she first noticed about 1 month ago. Since then the site has become more tender and enlarged and she also has noticed intermittent traces of yellow nipple discharge.  She reports a strong family history of breast cancer and is concerned about this possibility.  She denies trauma to the site.  She also denies fevers or chills, shortness of breath, skin changes or rash.  Pain is worsened with palpation but she describes feeling uncomfortable at rest as her clothing irritates the site.  She has taken up to 800 mg of ibuprofen without relief of symptoms.  She is not pregnant as she has undergone a partial hysterectomy while in her 36's.    Past Medical History  Diagnosis Date  . Fibromyalgia   . Chronic back pain   . Headache(784.0)   . Endometriosis   . Ovarian cyst   . Suicide attempt   . Chronic pelvic pain in female   . Kidney stone   . Anxiety   . Depression   . Interstitial cystitis    Past Surgical History  Procedure Laterality Date  . Abdominal hysterectomy    . Hernia repair     Family History  Problem Relation Age of Onset  . Hypertension Mother   . Hypertension Father   . Muscular dystrophy Brother    History  Substance Use Topics  . Smoking status: Current Every Day Smoker -- 0.50 packs/day for 11 years    Types: Cigarettes  . Smokeless tobacco: Never Used  . Alcohol Use: No   OB History   Grav Para Term Preterm Abortions TAB SAB Ect Mult Living   3 2 2  1  1   2      Review of Systems  Constitutional: Negative for fever and chills.  HENT: Negative for congestion and sore throat.    Eyes: Negative.   Respiratory: Negative for chest tightness and shortness of breath.   Gastrointestinal: Negative for nausea and abdominal pain.  Genitourinary: Negative.   Musculoskeletal: Negative for arthralgias, joint swelling and neck pain.  Skin: Negative.  Negative for rash and wound.  Neurological: Negative for dizziness, weakness, light-headedness, numbness and headaches.  Psychiatric/Behavioral: Negative.       Allergies  Ciprofloxacin  Home Medications   Prior to Admission medications   Medication Sig Start Date End Date Taking? Authorizing Provider  ibuprofen (ADVIL,MOTRIN) 200 MG tablet Take 800 mg by mouth every 6 (six) hours as needed. pain   Yes Historical Provider, MD  HYDROcodone-acetaminophen (NORCO/VICODIN) 5-325 MG per tablet Take 1 tablet by mouth every 4 (four) hours as needed. 01/03/14   Evalee Jefferson, PA-C   BP 135/90  Pulse 102  Temp(Src) 99.1 F (37.3 C) (Oral)  Ht 5\' 1"  (1.549 m)  Wt 95 lb (43.092 kg)  BMI 17.96 kg/m2  SpO2 99% Physical Exam  Nursing note and vitals reviewed. Constitutional: She appears well-developed and well-nourished.  HENT:  Head: Normocephalic and atraumatic.  Eyes: Conjunctivae are normal.  Neck: Normal range of motion.  Cardiovascular: Normal rate, regular rhythm, normal heart sounds and intact distal pulses.   Pulmonary/Chest: Effort normal and  breath sounds normal. She has no wheezes.  Right breast nodularity and ttp between the 9 and 12 oclock position.  No hard or fixed nodules appreciated.  Nodularity is increased on the right in comparison to left breast.  No nipple discharge, no dimpling or skin changes.    Abdominal: Soft. Bowel sounds are normal. There is no tenderness.  Musculoskeletal: Normal range of motion.  Lymphadenopathy:    She has axillary adenopathy.       Right axillary: Pectoral adenopathy present.  Neurological: She is alert.  Skin: Skin is warm and dry.  Psychiatric: She has a normal mood and  affect.    ED Course  Procedures (including critical care time) Labs Review Labs Reviewed - No data to display  Imaging Review No results found.   EKG Interpretation None      MDM   Final diagnoses:  Breast nodule    Pt was discussed with Dr. Roderic Palau prior to patient being dispo'd home.  She was scheduled for an [place] with our breast clinic here in 5 days.  Mammogram per radiologists discretion pending [place] results.  Note sent via EPIC to pcp Dena Billet re scheduled study.  Pt was prescribed a few hydrocodone for pain relief pending further testing.  Caution regarding sedation.  Advised to avoid rubbing/ manipulating which can flare up pain, swelling.  Doubt abscess, no skin changes, no nipple dc on exam today.  Skin normal appearance, no erythema.      Evalee Jefferson, PA-C 01/04/14 [phone]

## 2014-01-07 ENCOUNTER — Other Ambulatory Visit: Payer: Self-pay | Admitting: Physician Assistant

## 2014-01-07 DIAGNOSIS — N6310 Unspecified lump in the right breast, unspecified quadrant: Secondary | ICD-10-CM

## 2014-01-08 ENCOUNTER — Ambulatory Visit (HOSPITAL_COMMUNITY)
Admission: RE | Admit: 2014-01-08 | Discharge: 2014-01-08 | Disposition: A | Discharge status: Home / Self Care | Payer: Medicaid Other | Source: Ambulatory Visit | Attending: Physician Assistant | Admitting: Physician Assistant

## 2014-01-08 ENCOUNTER — Other Ambulatory Visit: Payer: Self-pay | Admitting: Physician Assistant

## 2014-01-08 VITALS — BP 135/78 | HR 80 | Temp 98.4°F | Resp 16

## 2014-01-08 DIAGNOSIS — N6310 Unspecified lump in the right breast, unspecified quadrant: Secondary | ICD-10-CM

## 2014-01-08 DIAGNOSIS — IMO0002 Reserved for concepts with insufficient information to code with codable children: Secondary | ICD-10-CM

## 2014-01-08 DIAGNOSIS — R229 Localized swelling, mass and lump, unspecified: Principal | ICD-10-CM

## 2014-01-08 DIAGNOSIS — N63 Unspecified lump in unspecified breast: Secondary | ICD-10-CM | POA: Insufficient documentation

## 2014-01-08 MED ORDER — LIDOCAINE HCL (PF) 2 % IJ SOLN
INTRAMUSCULAR | Status: AC
  Filled 2014-01-08: qty 10

## 2014-01-08 NOTE — Discharge Instructions (Signed)
Breast Biopsy Care After These instructions give you information on caring for yourself after your procedure. Your doctor may also give you more specific instructions. Call your doctor if you have any problems or questions after your procedure. HOME CARE  Only take medicine as told by your doctor.  Do not take aspirin.  Keep your sutures (stitches) dry when bathing.  Protect the biopsy area. Do not let the area get bumped.  Avoid activities that could pull the biopsy site open until your doctor approves. This includes:  Stretching.  Reaching.  Exercise.  Sports.  Lifting more than 3lb.  Continue your normal diet.  Wear a good support bra for as long as told by your doctor.  Change any bandages (dressings) as told by your doctor.  Do not drink alcohol while taking pain medicine.  Keep all doctor visits as told. Ask when your test results will be ready. Make sure you get your test results. GET HELP RIGHT AWAY IF:   You have a fever.  You have more bleeding (more than a small spot) from the biopsy site.  You have trouble breathing.  You have yellowish-white fluid (pus) coming from the biopsy site.  You have redness, puffiness (swelling), or more pain in the biopsy site.  You have a bad smell coming from the biopsy site.  Your biopsy site opens after sutures, staples, or sticky strips have been removed.  You have a rash.  You need stronger medicine. MAKE SURE YOU:  Understand these instructions.  Will watch your condition.  Will get help right away if you are not doing well or get worse. Document Released: 04/10/2009 Document Revised: 09/06/2011 Document Reviewed: 07/25/2011 [facility] Patient Information 2015 [place], [place]. This information is not intended to replace advice given to you by your health care provider. Make sure you discuss any questions you have with your health care provider.  Breast Biopsy A breast biopsy is a test during which a sample of  tissue is taken from your breast. The breast tissue is looked at under a microscope for cancer cells.  BEFORE THE PROCEDURE  Make plans to have someone drive you home after the test.  Do not smoke for 2 weeks before the test. Stop smoking, if you smoke.  Do not drink alcohol for 24 hours before the test.  Wear a good support bra to the test. PROCEDURE  You may be given one of the following:  A medicine to numb the breast area (local anesthesia).  A medicine to make you sleep (general anesthesia). There are different types of breast biopsies. They include:  Fine-needle aspiration.  A needle is put into the breast lump.  The needle takes out fluid and cells from the lump.  Ultrasound imaging may be used to help find the lump and to put the needle it the right spot.  Core-needle biopsy.  A needle is put into the breast lump.  The needle is put in your breast 3-6 times.  The needle removes breast tissue.  An ultrasound image or X-ray is often used to find the right spot to put in the needle.  Stereotactic biopsy.  X-rays and a computer are used to study X-ray pictures of the breast lump.  The computer finds where the needle needs to be put into the breast.  Tissue samples are taken out.  Vacuum-assisted biopsy.  A small cut (incision) is made in your breast.  A biopsy device is put through the cut and into the breast tissue.  The biopsy device draws abnormal breast tissue into the biopsy device.  A large tissue sample is often removed.  No stitches are needed.  Ultrasound-guided core-needle biopsy.  Ultrasound imaging helps guide the needle into the area of the breast that is not normal.  A cut is made in the breast. The needle is put in the needle.  Tissue samples are taken out.  Open biopsy.  A large cut is made in the breast.  Your doctor will try to remove the whole breast lump or as much as possible. All tissue, fluid, or cell samples are looked  at under a microscope.  AFTER THE PROCEDURE  You will be taken to an area to recover. You will be able to go home once you are doing well and are without problems.  You may have bruising on your breast. This is normal.  A pressure bandage (dressing) may be put on your breast for 24-8 hours. This type of bandage is wrapped tightly around your chest. It helps stop fluid from building up underneath tissues. Document Released: 09/06/2011 Document Reviewed: 09/06/2011 [facility] Patient Information 2015 [place]. This information is not intended to replace advice given to you by your health care provider. Make sure you discuss any questions you have with your health care provider.

## 2014-01-08 NOTE — Procedures (Signed)
With US guidance, biopsy is performed of right axillary mass.  Specimen to lab. No complications. Tolerated well.

## 2014-02-12 ENCOUNTER — Other Ambulatory Visit (HOSPITAL_COMMUNITY): Payer: Self-pay | Admitting: Emergency Medicine

## 2014-02-12 ENCOUNTER — Ambulatory Visit (HOSPITAL_COMMUNITY): Admission: RE | Admit: 2014-02-12 | Payer: Medicaid Other | Source: Ambulatory Visit

## 2014-02-12 DIAGNOSIS — N644 Mastodynia: Secondary | ICD-10-CM

## 2014-03-13 ENCOUNTER — Telehealth: Payer: Self-pay | Admitting: Physician Assistant

## 2014-03-13 NOTE — Telephone Encounter (Signed)
[phone] Patient is a patient here with [facility] and last July the hospital sent her to get scan done for a lump she had found, now is getting painful again and growing would like to know if we can get her referred to do this again  [facility] is where she went

## 2014-03-13 NOTE — Telephone Encounter (Signed)
lm[facility]

## 2014-03-15 NOTE — Telephone Encounter (Signed)
Pt called back and has appt with [facility] since we have not seen her over a year in order to do referral.

## 2014-03-20 ENCOUNTER — Ambulatory Visit: Payer: Medicaid Other | Admitting: Physician Assistant

## 2014-03-20 ENCOUNTER — Encounter: Payer: Self-pay | Admitting: Family Medicine

## 2014-03-20 ENCOUNTER — Encounter: Payer: Self-pay | Admitting: Physician Assistant

## 2014-03-20 ENCOUNTER — Ambulatory Visit (INDEPENDENT_AMBULATORY_CARE_PROVIDER_SITE_OTHER): Payer: Medicaid Other | Admitting: Physician Assistant

## 2014-03-20 VITALS — BP 144/98 | HR 88 | Temp 98.7°F | Resp 18 | Wt 98.0 lb

## 2014-03-20 DIAGNOSIS — K458 Other specified abdominal hernia without obstruction or gangrene: Secondary | ICD-10-CM

## 2014-03-20 DIAGNOSIS — N644 Mastodynia: Secondary | ICD-10-CM

## 2014-03-20 DIAGNOSIS — F411 Generalized anxiety disorder: Secondary | ICD-10-CM

## 2014-03-20 MED ORDER — TRAMADOL HCL 50 MG PO TABS: ORAL_TABLET | ORAL | Status: DC

## 2014-03-20 MED ORDER — CLONAZEPAM 0.5 MG PO TABS: 0.5000 mg | ORAL_TABLET | Freq: Two times a day (BID) | ORAL | Status: DC | PRN

## 2014-03-20 NOTE — Progress Notes (Signed)
Patient ID: Susan Gross MRN: [id number], DOB: [birth year], 33 y.o. Date of Encounter: @DATE @  Chief Complaint:  Chief Complaint  Patient presents with  . lump rt lateral breast    see history has had recent treatment,  having problem at old hernia site    HPI: 33 y.o. year old white female  presents with above problems/compalints.   She underwent ultrasound of right breast on 01/08/14. Report states that she has palpable tender mass in the right axilla appears to be related to small lymphangiocelel. Biopsy recommended. Ultrasound guided core biopsy was scheduled for that same day.--Performed by Dwaine Deter.  Patient states that she had gone to the ER because  right breast was tender and enlarged. After that is when she was scheduled for ultrasound and biopsy. She says that prior to this episode, she had no problems with her breast. She states that since the procedure, she has continued to have areas that are enlarged and tender in the right upper breast. Says she called to schedule followup with Dr. [name], the physician who did the biopsy. However was told that she could not schedule followup with her without a referral secondary to insurance purposes.  Also patient is requesting medication to use for anxiety. Says it she has been really concerned and worried about this problem. Also says that she has a lot of stress at home with her children etc.  She is also requesting a note to turn in regarding work. 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 Needs a note to turn in for documentation so that she will not lose her job.  Also she says that in the year 2000 she had hernia repair --she shows me the scar, which is in her left suprapubic area. She says that recently she has been feeling recurrent hernia there. As well, just above where she can feel the hernia, she can fill the mesh pricking into her--the mesh that was placed at the  prior surgery.     Past Medical History  Diagnosis Date  . Fibromyalgia   . Chronic back pain   . Headache(784.0)   . Endometriosis   . Ovarian cyst   . Suicide attempt   . Chronic pelvic pain in female   . Kidney stone   . Anxiety   . Depression   . Interstitial cystitis      Home Meds: Outpatient Prescriptions Prior to Visit  Medication Sig Dispense Refill  . ibuprofen (ADVIL,MOTRIN) 200 MG tablet Take 800 mg by mouth every 6 (six) hours as needed. pain      . HYDROcodone-acetaminophen (NORCO/VICODIN) 5-325 MG per tablet Take 1 tablet by mouth every 4 (four) hours as needed.  20 tablet  0   No facility-administered medications prior to visit.    Allergies:  Allergies  Allergen Reactions  . Ciprofloxacin Nausea And Vomiting    History   Social History  . Marital Status: Divorced    Spouse Name: N/A    Number of Children: N/A  . Years of Education: N/A   Occupational History  . Not on file.   Social History Main Topics  . Smoking status: Current Every Day Smoker -- 0.50 packs/day for 11 years    Types: Cigarettes  . Smokeless tobacco: Never Used  . Alcohol Use: No  . Drug Use: No  . Sexual Activity: Yes    Birth Control/ Protection: Surgical   Other  Topics Concern  . Not on file   Social History Narrative  . No narrative on file    Family History  Problem Relation Age of Onset  . Hypertension Mother   . Hypertension Father   . Muscular dystrophy Brother      Review of Systems:  See HPI for pertinent ROS. All other ROS negative.    Physical Exam: Blood pressure 144/98, pulse 88, temperature 98.7 F (37.1 C), temperature source Oral, resp. rate 18, weight 98 lb (44.453 kg)., Body mass index is 18.53 kg/(m^2). General: Thin WF. Appears exhausted, stressed. Appears in no acute distress. Neck: Supple. No thyromegaly. No lymphadenopathy. Lungs: Clear bilaterally to auscultation without wheezes, rales, or rhonchi. Breathing is unlabored. Heart:  RRR with S1 S2. No murmurs, rubs, or gallops. Breast: Right Breast: in the upper lateral portion, there is firmness. This area is very tender with palpation.  Abdomen:There is scar in left suprapubic area. There is protrusion present, which is soft, and reducible.  Musculoskeletal:  Strength and tone normal for age. Extremities/Skin: Warm and dry. No clubbing or cyanosis. No edema. No rashes or suspicious lesions. Neuro: Alert and oriented X 3. Moves all extremities spontaneously. Gait is normal. CNII-XII grossly in tact. Psych:  Responds to questions appropriately with a normal affect. She appears exhausted and stressed.      ASSESSMENT AND PLAN:  33 y.o. year old female with  1. Breast pain, right - Ambulatory referral to Interventional Radiology--I spoke with referral nurse-- explained the situation and explained that the patient needs to followup with Dr. Shon Hale. Explained to her that I'm not sure that she is actually located in interventional radiology or not but that this is who she needs to followup with. - traMADol (ULTRAM) 50 MG tablet; 1 - 2 every 8 hours as needed for pain.  Dispense: 60 tablet; Refill: 0  2. Anxiety state, unspecified - clonazePAM (KLONOPIN) 0.5 MG tablet; Take 1 tablet (0.5 mg total) by mouth 2 (two) times daily as needed for anxiety.  Dispense: 60 tablet; Refill: 0  3. Recurrent abdominal hernia without obstruction or gangrene, unspecified hernia type - Ambulatory referral to General Surgery   Signed, [facility] [place], [place], [facility] 03/20/2014 3:52 PM

## 2014-03-21 ENCOUNTER — Other Ambulatory Visit: Payer: Self-pay | Admitting: Physician Assistant

## 2014-03-21 DIAGNOSIS — N644 Mastodynia: Secondary | ICD-10-CM

## 2014-03-28 ENCOUNTER — Ambulatory Visit
Admission: RE | Admit: 2014-03-28 | Discharge: 2014-03-28 | Disposition: A | Discharge status: Home / Self Care | Payer: Medicaid Other | Source: Ambulatory Visit | Attending: Physician Assistant | Admitting: Physician Assistant

## 2014-03-28 ENCOUNTER — Other Ambulatory Visit: Payer: Self-pay | Admitting: Physician Assistant

## 2014-03-28 DIAGNOSIS — N644 Mastodynia: Secondary | ICD-10-CM

## 2014-04-23 ENCOUNTER — Other Ambulatory Visit (INDEPENDENT_AMBULATORY_CARE_PROVIDER_SITE_OTHER): Payer: Self-pay | Admitting: General Surgery

## 2014-04-23 DIAGNOSIS — K4031 Unilateral inguinal hernia, with obstruction, without gangrene, recurrent: Secondary | ICD-10-CM

## 2014-04-29 ENCOUNTER — Encounter: Payer: Self-pay | Admitting: Physician Assistant

## 2014-05-03 ENCOUNTER — Ambulatory Visit
Admission: RE | Admit: 2014-05-03 | Discharge: 2014-05-03 | Disposition: A | Discharge status: Home / Self Care | Payer: Medicaid Other | Source: Ambulatory Visit | Attending: General Surgery | Admitting: General Surgery

## 2014-05-03 DIAGNOSIS — K4031 Unilateral inguinal hernia, with obstruction, without gangrene, recurrent: Secondary | ICD-10-CM

## 2014-06-07 ENCOUNTER — Ambulatory Visit: Payer: Medicaid Other | Admitting: Family Medicine

## 2014-10-09 ENCOUNTER — Encounter (HOSPITAL_COMMUNITY): Payer: Self-pay | Admitting: *Deleted

## 2014-10-09 ENCOUNTER — Emergency Department (HOSPITAL_COMMUNITY)
Admission: EM | Admit: 2014-10-09 | Discharge: 2014-10-09 | Disposition: A | Discharge status: Home / Self Care | Payer: Medicaid Other | Attending: Emergency Medicine | Admitting: Emergency Medicine

## 2014-10-09 DIAGNOSIS — Z90710 Acquired absence of both cervix and uterus: Secondary | ICD-10-CM | POA: Insufficient documentation

## 2014-10-09 DIAGNOSIS — F419 Anxiety disorder, unspecified: Secondary | ICD-10-CM | POA: Insufficient documentation

## 2014-10-09 DIAGNOSIS — Z87448 Personal history of other diseases of urinary system: Secondary | ICD-10-CM | POA: Diagnosis not present

## 2014-10-09 DIAGNOSIS — Z79899 Other long term (current) drug therapy: Secondary | ICD-10-CM | POA: Diagnosis not present

## 2014-10-09 DIAGNOSIS — Z720 Tobacco use: Secondary | ICD-10-CM | POA: Diagnosis not present

## 2014-10-09 DIAGNOSIS — F329 Major depressive disorder, single episode, unspecified: Secondary | ICD-10-CM | POA: Insufficient documentation

## 2014-10-09 DIAGNOSIS — Z9889 Other specified postprocedural states: Secondary | ICD-10-CM | POA: Diagnosis not present

## 2014-10-09 DIAGNOSIS — Z87442 Personal history of urinary calculi: Secondary | ICD-10-CM | POA: Diagnosis not present

## 2014-10-09 DIAGNOSIS — R109 Unspecified abdominal pain: Secondary | ICD-10-CM | POA: Diagnosis present

## 2014-10-09 DIAGNOSIS — R112 Nausea with vomiting, unspecified: Secondary | ICD-10-CM | POA: Diagnosis not present

## 2014-10-09 DIAGNOSIS — G8929 Other chronic pain: Secondary | ICD-10-CM | POA: Insufficient documentation

## 2014-10-09 MED ORDER — ONDANSETRON 8 MG PO TBDP
8.0000 mg | ORAL_TABLET | Freq: Once | ORAL | Status: AC
  Administered 2014-10-09: 8 mg via ORAL
  Filled 2014-10-09: qty 1

## 2014-10-09 MED ORDER — ONDANSETRON HCL 8 MG PO TABS
8.0000 mg | ORAL_TABLET | Freq: Three times a day (TID) | ORAL | Status: DC | PRN
Start: 2014-10-09 — End: 2016-01-25

## 2014-10-09 NOTE — ED Notes (Signed)
Given coke. Tolerated well.

## 2014-10-09 NOTE — ED Provider Notes (Signed)
CSN: [id number]     Arrival date & time 10/09/14  1823 History   First MD Initiated Contact with Patient 10/09/14 1918     Chief Complaint  Patient presents with  . Abdominal Pain     (Consider location/radiation/quality/duration/timing/severity/associated sxs/prior Treatment) HPI   Susan Gross is a 34 y.o. female who presents for evaluation of nausea, vomiting, diarrhea. Her son has an identical illness. She has been ill for about 36 hours. She denies fever, chills, cough, shortness of breath, chest pain, weakness or dizziness. There are no other known modifying factors.   Past Medical History  Diagnosis Date  . Fibromyalgia   . Chronic back pain   . Headache(784.0)   . Endometriosis   . Ovarian cyst   . Suicide attempt   . Chronic pelvic pain in female   . Anxiety   . Depression   . Interstitial cystitis   . Kidney stone    Past Surgical History  Procedure Laterality Date  . Abdominal hysterectomy    . Hernia repair     Family History  Problem Relation Age of Onset  . Hypertension Mother   . Hypertension Father   . Muscular dystrophy Brother    History  Substance Use Topics  . Smoking status: Current Every Day Smoker -- 0.50 packs/day for 11 years    Types: Cigarettes  . Smokeless tobacco: Never Used  . Alcohol Use: No   OB History    Gravida Para Term Preterm AB TAB SAB Ectopic Multiple Living   3 2 2  1  1   2      Review of Systems  All other systems reviewed and are negative.     Allergies  Ciprofloxacin  Home Medications   Prior to Admission medications   Medication Sig Start Date End Date Taking? Authorizing Provider  ibuprofen (ADVIL,MOTRIN) 200 MG tablet Take 800 mg by mouth every 6 (six) hours as needed. pain   Yes Historical Provider, MD  clonazePAM (KLONOPIN) 0.5 MG tablet Take 1 tablet (0.5 mg total) by mouth 2 (two) times daily as needed for anxiety. Patient not taking: Reported on 10/09/2014 03/20/14   Orlena Sheldon, PA-C   HYDROcodone-acetaminophen (NORCO/VICODIN) 5-325 MG per tablet Take 1 tablet by mouth every 4 (four) hours as needed. Patient not taking: Reported on 10/09/2014 01/03/14   Evalee Jefferson, PA-C  ondansetron (ZOFRAN) 8 MG tablet Take 1 tablet (8 mg total) by mouth every 8 (eight) hours as needed for nausea or vomiting. 10/09/14   Daleen Bo, MD  traMADol (ULTRAM) 50 MG tablet 1 - 2 every 8 hours as needed for pain. Patient not taking: Reported on 10/09/2014 03/20/14   Orlena Sheldon, PA-C   BP 115/73 mmHg  Pulse 70  Temp(Src) 98.6 F (37 C) (Oral)  Resp 19  Ht 5\' 1"  (1.549 m)  Wt 97 lb (43.999 kg)  BMI 18.34 kg/m2  SpO2 97% Physical Exam  Constitutional: She is oriented to person, place, and time. She appears well-developed and well-nourished. No distress.  HENT:  Head: Normocephalic and atraumatic.  Right Ear: External ear normal.  Left Ear: External ear normal.  Eyes: Conjunctivae and EOM are normal. Pupils are equal, round, and reactive to light.  Neck: Normal range of motion and phonation normal. Neck supple.  Cardiovascular: Normal rate, regular rhythm and normal heart sounds.   Pulmonary/Chest: Effort normal and breath sounds normal. She exhibits no bony tenderness.  Abdominal: Soft. There is tenderness (Left upper and lower,  mild).  Musculoskeletal: Normal range of motion.  Neurological: She is alert and oriented to person, place, and time. No cranial nerve deficit or sensory deficit. She exhibits normal muscle tone. Coordination normal.  Skin: Skin is warm, dry and intact.  Psychiatric: She has a normal mood and affect. Her behavior is normal. Judgment and thought content normal.  Nursing note and vitals reviewed.   ED Course  Procedures (including critical care time)  Medications  ondansetron (ZOFRAN-ODT) disintegrating tablet 8 mg (8 mg Oral Given 10/09/14 1957)    Patient Vitals for the past 24 hrs:  BP Temp Temp src Pulse Resp SpO2 Height Weight  10/09/14 2145 115/73 mmHg  98.6 F (37 C) Oral 70 19 97 % - -  10/09/14 2033 114/67 mmHg 98.6 F (37 C) Oral 66 20 98 % - -  10/09/14 1833 124/78 mmHg 98.6 F (37 C) Oral 85 20 - 5\' 1"  (1.549 m) 97 lb (43.999 kg)    At discharge- Reevaluation with update and discussion. After initial assessment and treatment, an updated evaluation reveals tolerating oral fluids. No further complaints. Findings discussed with patient, all questions answered. [place] Review Labs Reviewed - No data to display  Imaging Review No results found.   EKG Interpretation None      MDM   Final diagnoses:  Nausea and vomiting, vomiting of unspecified type    Likely viral illness. Doubt serous peritoneal infection. Metabolic instability or impending vascular collapse.  Nursing Notes Reviewed/ Care Coordinated, and agree without changes. Applicable Imaging Reviewed.  Interpretation of Laboratory Data incorporated into ED treatment  Nursing Notes Reviewed/ Care Coordinated Applicable Imaging Reviewed Interpretation of Laboratory Data incorporated into ED treatment  The patient appears reasonably screened and/or stabilized for discharge and I doubt any other medical condition or other [facility] requiring further screening, evaluation, or treatment in the ED at this time prior to discharge.  Plan: Home Medications- Zofran; Home Treatments- gradually advance diet; return here if the recommended treatment, does not improve the symptoms; Recommended follow up- PCP when necessary   Daleen Bo, MD 10/10/14 [phone]

## 2014-10-09 NOTE — ED Notes (Signed)
Nausea for 2 days, Now vomiting and diarrhea with abd pain. And sore throat.

## 2014-10-09 NOTE — ED Notes (Signed)
Pt ambulating independently w/ steady gait on d/c in no acute distress, A&Ox4. D/c instructions reviewed w/ pt and family - pt and family deny any further questions or concerns at present. Rx given x1  

## 2014-10-09 NOTE — Discharge Instructions (Signed)

## 2014-10-14 ENCOUNTER — Emergency Department (HOSPITAL_COMMUNITY)
Admission: EM | Admit: 2014-10-14 | Discharge: 2014-10-14 | Disposition: A | Discharge status: Home / Self Care | Payer: Medicaid Other | Attending: Emergency Medicine | Admitting: Emergency Medicine

## 2014-10-14 ENCOUNTER — Encounter (HOSPITAL_COMMUNITY): Payer: Self-pay | Admitting: Emergency Medicine

## 2014-10-14 ENCOUNTER — Emergency Department (HOSPITAL_COMMUNITY): Payer: Medicaid Other

## 2014-10-14 DIAGNOSIS — R109 Unspecified abdominal pain: Secondary | ICD-10-CM | POA: Diagnosis present

## 2014-10-14 DIAGNOSIS — F419 Anxiety disorder, unspecified: Secondary | ICD-10-CM | POA: Diagnosis not present

## 2014-10-14 DIAGNOSIS — Z720 Tobacco use: Secondary | ICD-10-CM | POA: Diagnosis not present

## 2014-10-14 DIAGNOSIS — Z792 Long term (current) use of antibiotics: Secondary | ICD-10-CM | POA: Insufficient documentation

## 2014-10-14 DIAGNOSIS — Z87442 Personal history of urinary calculi: Secondary | ICD-10-CM | POA: Insufficient documentation

## 2014-10-14 DIAGNOSIS — Z90710 Acquired absence of both cervix and uterus: Secondary | ICD-10-CM | POA: Insufficient documentation

## 2014-10-14 DIAGNOSIS — Z87448 Personal history of other diseases of urinary system: Secondary | ICD-10-CM | POA: Diagnosis not present

## 2014-10-14 DIAGNOSIS — Z9889 Other specified postprocedural states: Secondary | ICD-10-CM | POA: Diagnosis not present

## 2014-10-14 DIAGNOSIS — R1032 Left lower quadrant pain: Secondary | ICD-10-CM | POA: Insufficient documentation

## 2014-10-14 DIAGNOSIS — G8929 Other chronic pain: Secondary | ICD-10-CM | POA: Diagnosis not present

## 2014-10-14 DIAGNOSIS — Z79899 Other long term (current) drug therapy: Secondary | ICD-10-CM | POA: Diagnosis not present

## 2014-10-14 DIAGNOSIS — F329 Major depressive disorder, single episode, unspecified: Secondary | ICD-10-CM | POA: Diagnosis not present

## 2014-10-14 DIAGNOSIS — Z8739 Personal history of other diseases of the musculoskeletal system and connective tissue: Secondary | ICD-10-CM | POA: Insufficient documentation

## 2014-10-14 DIAGNOSIS — A09 Infectious gastroenteritis and colitis, unspecified: Secondary | ICD-10-CM | POA: Diagnosis not present

## 2014-10-14 LAB — URINALYSIS, ROUTINE W REFLEX MICROSCOPIC
BILIRUBIN URINE: NEGATIVE
GLUCOSE, UA: NEGATIVE mg/dL
Hgb urine dipstick: NEGATIVE
KETONES UR: NEGATIVE mg/dL
LEUKOCYTES UA: NEGATIVE
Nitrite: NEGATIVE
Protein, ur: NEGATIVE mg/dL
Specific Gravity, Urine: <1.005 — ABNORMAL LOW (ref 1.005–1.030)
Urobilinogen, UA: 0.2 mg/dL (ref 0.0–1.0)
pH: 5.5 (ref 5.0–8.0)

## 2014-10-14 LAB — COMPREHENSIVE METABOLIC PANEL
ALT: 12 U/L (ref 0–35)
AST: 17 U/L (ref 0–37)
Albumin: 3.8 g/dL (ref 3.5–5.2)
Alkaline Phosphatase: 54 U/L (ref 39–117)
Anion gap: 6 (ref 5–15)
BUN: 9 mg/dL (ref 6–23)
CALCIUM: 8.5 mg/dL (ref 8.4–10.5)
CO2: 24 mmol/L (ref 19–32)
CREATININE: 0.58 mg/dL (ref 0.50–1.10)
Chloride: 108 mmol/L (ref 96–112)
GLUCOSE: 99 mg/dL (ref 70–99)
Potassium: 3.9 mmol/L (ref 3.5–5.1)
Sodium: 138 mmol/L (ref 135–145)
Total Bilirubin: 0.3 mg/dL (ref 0.3–1.2)
Total Protein: 6.3 g/dL (ref 6.0–8.3)

## 2014-10-14 LAB — LIPASE, BLOOD: Lipase: 27 U/L (ref 11–59)

## 2014-10-14 LAB — CBC WITH DIFFERENTIAL/PLATELET
BASOS ABS: 0 10*3/uL (ref 0.0–0.1)
Basophils Relative: 0 % (ref 0–1)
EOS PCT: 1 % (ref 0–5)
Eosinophils Absolute: 0.1 10*3/uL (ref 0.0–0.7)
HCT: 41.3 % (ref 36.0–46.0)
HEMOGLOBIN: 14 g/dL (ref 12.0–15.0)
Lymphocytes Relative: 34 % (ref 12–46)
Lymphs Abs: 1.8 10*3/uL (ref 0.7–4.0)
MCH: 31 pg (ref 26.0–34.0)
MCHC: 33.9 g/dL (ref 30.0–36.0)
MCV: 91.4 fL (ref 78.0–100.0)
Monocytes Absolute: 0.3 10*3/uL (ref 0.1–1.0)
Monocytes Relative: 5 % (ref 3–12)
Neutro Abs: 3.2 10*3/uL (ref 1.7–7.7)
Neutrophils Relative %: 60 % (ref 43–77)
Platelets: 176 10*3/uL (ref 150–400)
RBC: 4.52 MIL/uL (ref 3.87–5.11)
RDW: 12.9 % (ref 11.5–15.5)
WBC: 5.3 10*3/uL (ref 4.0–10.5)

## 2014-10-14 MED ORDER — METRONIDAZOLE 500 MG PO TABS: 500.0000 mg | ORAL_TABLET | Freq: Three times a day (TID) | ORAL | Status: DC

## 2014-10-14 MED ORDER — AZITHROMYCIN 250 MG PO TABS: 250.0000 mg | ORAL_TABLET | Freq: Every day | ORAL | Status: DC

## 2014-10-14 MED ORDER — IOHEXOL 300 MG/ML  SOLN
100.0000 mL | Freq: Once | INTRAMUSCULAR | Status: AC | PRN
  Administered 2014-10-14: 100 mL via INTRAVENOUS

## 2014-10-14 MED ORDER — METRONIDAZOLE 500 MG PO TABS
500.0000 mg | ORAL_TABLET | Freq: Once | ORAL | Status: AC
  Administered 2014-10-14: 500 mg via ORAL
  Filled 2014-10-14: qty 1

## 2014-10-14 MED ORDER — IOHEXOL 300 MG/ML  SOLN
50.0000 mL | Freq: Once | INTRAMUSCULAR | Status: AC | PRN
  Administered 2014-10-14: 50 mL via ORAL

## 2014-10-14 MED ORDER — HYDROCODONE-ACETAMINOPHEN 5-325 MG PO TABS: 1.0000 | ORAL_TABLET | ORAL | Status: DC | PRN

## 2014-10-14 MED ORDER — ONDANSETRON 4 MG PO TBDP: 4.0000 mg | ORAL_TABLET | Freq: Three times a day (TID) | ORAL | Status: DC | PRN

## 2014-10-14 MED ORDER — MORPHINE SULFATE 4 MG/ML IJ SOLN
4.0000 mg | INTRAMUSCULAR | Status: DC | PRN
  Administered 2014-10-14 (×2): 4 mg via INTRAVENOUS
  Filled 2014-10-14 (×2): qty 1

## 2014-10-14 MED ORDER — ONDANSETRON HCL 4 MG/2ML IJ SOLN
4.0000 mg | Freq: Once | INTRAMUSCULAR | Status: AC
  Administered 2014-10-14: 4 mg via INTRAVENOUS
  Filled 2014-10-14: qty 2

## 2014-10-14 MED ORDER — SODIUM CHLORIDE 0.9 % IV BOLUS (SEPSIS)
1000.0000 mL | Freq: Once | INTRAVENOUS | Status: AC
  Administered 2014-10-14: 1000 mL via INTRAVENOUS

## 2014-10-14 MED ORDER — SODIUM CHLORIDE 0.9 % IV SOLN
Freq: Once | INTRAVENOUS | Status: AC
  Administered 2014-10-14: 12:00:00 via INTRAVENOUS

## 2014-10-14 NOTE — ED Notes (Signed)
Pt reports she was seen here 1 week ago for vomiting diarrhea. Pt states now she is constantly nauseated, has pain in her L mid abdominal area. Pt reports this am she had blood in her stool.

## 2014-10-14 NOTE — Discharge Instructions (Signed)

## 2014-10-14 NOTE — ED Notes (Signed)
MD James at bedside.  

## 2014-10-14 NOTE — ED Provider Notes (Signed)
CSN: [id number]     Arrival date & time 10/14/14  0901 History  This chart was scribed for Tanna Furry, MD by Molli Posey, ED Scribe. This patient was seen in room APA02/APA02 and the patient's care was started 9:44 AM.    Chief Complaint  Patient presents with  . Abdominal Pain   The history is provided by the patient. No language interpreter was used.   HPI Comments: Susan Gross is a 34 y.o. female with a history of kidney stone, instertitial cystitits, ovarian cyst and chronic back pain who presents to the Emergency Department complaining of worsening, left sided abdominal pain for the last 6 days. Pt reports associated nausea, fever and says she has been belching. She states that she noticed blood in her stool this morning and says it was not bright or dark. Pt states that this was her first BM in 6 days and describes it as watery. She states she has never noticed blood in her bowel movements before. Pt was seen here 5 days ago for the same symptoms and states her pain has worsened since that visit. Pt reports no history of kidney problems. Pt denies history of Crohn's. She denies diarrhea, CP or SOB.    Past Medical History  Diagnosis Date  . Fibromyalgia   . Chronic back pain   . Headache(784.0)   . Endometriosis   . Ovarian cyst   . Suicide attempt   . Chronic pelvic pain in female   . Anxiety   . Depression   . Interstitial cystitis   . Kidney stone    Past Surgical History  Procedure Laterality Date  . Abdominal hysterectomy    . Hernia repair     Family History  Problem Relation Age of Onset  . Hypertension Mother   . Hypertension Father   . Muscular dystrophy Brother    History  Substance Use Topics  . Smoking status: Current Every Day Smoker -- 0.50 packs/day for 11 years    Types: Cigarettes  . Smokeless tobacco: Never Used  . Alcohol Use: No   OB History    Gravida Para Term Preterm AB TAB SAB Ectopic Multiple Living   3 2 2  1  1   2      Review  of Systems  Constitutional: Positive for fever. Negative for chills, diaphoresis, appetite change and fatigue.  HENT: Negative for mouth sores, sore throat and trouble swallowing.   Eyes: Negative for visual disturbance.  Respiratory: Negative for cough, chest tightness, shortness of breath and wheezing.   Cardiovascular: Negative for chest pain.  Gastrointestinal: Positive for nausea, abdominal pain and blood in stool. Negative for vomiting, diarrhea and abdominal distention.  Endocrine: Negative for polydipsia, polyphagia and polyuria.  Genitourinary: Negative for dysuria, frequency and hematuria.  Musculoskeletal: Negative for gait problem.  Skin: Negative for color change, pallor and rash.  Neurological: Negative for dizziness, syncope, light-headedness and headaches.  Hematological: Does not bruise/bleed easily.  Psychiatric/Behavioral: Negative for behavioral problems and confusion.      Allergies  Ciprofloxacin  Home Medications   Prior to Admission medications   Medication Sig Start Date End Date Taking? Authorizing Provider  ibuprofen (ADVIL,MOTRIN) 200 MG tablet Take 800 mg by mouth every 6 (six) hours as needed. pain   Yes Historical Provider, MD  ondansetron (ZOFRAN) 8 MG tablet Take 1 tablet (8 mg total) by mouth every 8 (eight) hours as needed for nausea or vomiting. 10/09/14  Yes Daleen Bo, MD  azithromycin (  ZITHROMAX Z-PAK) 250 MG tablet Take 1 tablet (250 mg total) by mouth daily. 10/14/14   Tanna Furry, MD  clonazePAM (KLONOPIN) 0.5 MG tablet Take 1 tablet (0.5 mg total) by mouth 2 (two) times daily as needed for anxiety. Patient not taking: Reported on 10/09/2014 03/20/14   Orlena Sheldon, PA-C  HYDROcodone-acetaminophen (NORCO/VICODIN) 5-325 MG per tablet Take 1 tablet by mouth every 4 (four) hours as needed. 10/14/14   Tanna Furry, MD  metroNIDAZOLE (FLAGYL) 500 MG tablet Take 1 tablet (500 mg total) by mouth 3 (three) times daily. 10/14/14   Tanna Furry, MD  ondansetron  (ZOFRAN ODT) 4 MG disintegrating tablet Take 1 tablet (4 mg total) by mouth every 8 (eight) hours as needed for nausea. 10/14/14   Tanna Furry, MD  traMADol (ULTRAM) 50 MG tablet 1 - 2 every 8 hours as needed for pain. Patient not taking: Reported on 10/09/2014 03/20/14   Orlena Sheldon, PA-C   BP 116/75 mmHg  Pulse 71  Temp(Src) 98.6 F (37 C) (Oral)  Resp 16  Ht 5\' 1"  (1.549 m)  Wt 97 lb (43.999 kg)  BMI 18.34 kg/m2  SpO2 98% Physical Exam  Constitutional: She is oriented to person, place, and time. She appears well-developed and well-nourished. No distress.  HENT:  Head: Normocephalic.  Eyes: Conjunctivae are normal. Pupils are equal, round, and reactive to light. No scleral icterus.  Neck: Normal range of motion. Neck supple. No thyromegaly present.  Cardiovascular: Normal rate and regular rhythm.  Exam reveals no gallop and no friction rub.   No murmur heard. Pulmonary/Chest: Effort normal and breath sounds normal. No respiratory distress. She has no wheezes. She has no rales.  Abdominal: Soft. Bowel sounds are normal. She exhibits no distension. There is tenderness. There is no rebound.  Referred tenderness to left abdomen with palpation on the right.   Musculoskeletal: Normal range of motion.  Neurological: She is alert and oriented to person, place, and time.  Skin: Skin is warm and dry. No rash noted.  Psychiatric: She has a normal mood and affect. Her behavior is normal.  Nursing note and vitals reviewed.   ED Course  Procedures   DIAGNOSTIC STUDIES: Oxygen Saturation is 99% on RA, normal by my interpretation.    COORDINATION OF CARE: 9:50 AM Discussed treatment plan with pt at bedside and pt agreed to plan.   Labs Review Labs Reviewed  URINALYSIS, ROUTINE W REFLEX MICROSCOPIC - Abnormal; Notable for the following:    Specific Gravity, Urine <1.005 (*)    All other components within normal limits  URINE CULTURE  CBC WITH DIFFERENTIAL/PLATELET  COMPREHENSIVE  METABOLIC PANEL  LIPASE, BLOOD    Imaging Review Ct Abdomen Pelvis W Contrast  10/14/2014   CLINICAL DATA:  Left lower quadrant pain for 6 days  EXAM: CT ABDOMEN AND PELVIS WITH CONTRAST  TECHNIQUE: Multidetector CT imaging of the abdomen and pelvis was performed using the standard protocol following bolus administration of intravenous contrast.  CONTRAST:  70mL OMNIPAQUE IOHEXOL 300 MG/ML SOLN, 16mL OMNIPAQUE IOHEXOL 300 MG/ML SOLN  COMPARISON:  05/03/2014  FINDINGS: The lung bases are unremarkable. Sagittal images of the spine are unremarkable. Mild hepatic fatty infiltration. No focal hepatic mass. No calcified gallstones are noted within gallbladder. The pancreas, spleen and adrenal glands are unremarkable. Kidneys are symmetrical in size and enhancement. No hydronephrosis or hydroureter. There is nonobstructive calcified calculus in lower pole of the right kidney measures 4 mm.  No aortic aneurysm. Abundant stool noted  within cecum. Moderate stool noted in proximal right colon. There is no pericecal inflammation. The tip of the cecum age midline lower pelvis just above the urinary bladder. The appendix is not identified. There is some stool in redundant transverse colon. Moderate stool noted in distal transverse colon and descending colon. Mild redundant distal sigmoid colon. The patient is status post hysterectomy. The visualized right ovary is unremarkable. No pelvic ascites or adenopathy. Urinary bladder is empty limiting its assessment.  No evidence of colitis or diverticulitis. No small bowel obstruction. No ascites or free air. No adenopathy. A left renal cyst in midpole measures 1 cm  IMPRESSION: 1. Mild hepatic fatty infiltration. 2. There is a low lying cecum. Abundant stool noted within cecum. No pericecal inflammation. The appendix is not identified. 3. No small bowel obstruction 4. Status post hysterectomy. 5. No evidence of colitis or diverticulitis. 6. No hydronephrosis or hydroureter.  Right nonobstructive nephrolithiasis.   Electronically Signed   By: Lahoma Crocker M.D.   On: 10/14/2014 12:29     EKG Interpretation None      MDM   Final diagnoses:  LLQ pain  Infectious colitis   CT scan reassuring. With diarrhea, now turning bloody, we'll treat for infectious colitis. No thickening seen of the colon on CT. Discussed expectant management versus early initiation of antibiotic for the patient. She has no desire for even a brief trial expectant management and supportive care. Given prescriptions for Zithromax, and Flagyl (see patient allergic to quinolones). Discharge home. Push fluids. Vicodin Zofran when necessary. Primary care follow-up.    Tanna Furry, MD 10/14/14 1318

## 2014-10-16 LAB — URINE CULTURE

## 2014-10-17 ENCOUNTER — Telehealth (HOSPITAL_BASED_OUTPATIENT_CLINIC_OR_DEPARTMENT_OTHER): Payer: Self-pay | Admitting: Emergency Medicine

## 2014-10-17 NOTE — Telephone Encounter (Signed)
Post ED Visit - Positive Culture Follow-up  Culture report reviewed by antimicrobial stewardship pharmacist: []  Sundra Aland, Pharm.D., BCPS [x]  Heide Guile, Pharm.D., BCPS []  Alycia Rossetti, Pharm.D., BCPS []  [place], Pharm.D., BCPS, AAHIVP []  Legrand Como, Pharm.D., BCPS, AAHIVP []  Isac Sarna, Pharm.D., BCPS  Positive urine culture Klebsiella Treated with azithromycin and metronidazole, organism sensitive to the same and no further patient follow-up is required at this time.  Hazle Nordmann 10/17/2014, 9:21 AM

## 2014-10-17 NOTE — Progress Notes (Signed)
ED Antimicrobial Stewardship Positive Culture Follow Up   Susan Gross is an 34 y.o. female who presented to [facility] on 10/14/2014 with a chief complaint of N/V, diarrhea. Chief Complaint  Patient presents with  . Abdominal Pain    Recent Results (from the past 720 hour(s))  Urine culture     Status: None   Collection Time: 10/14/14 11:28 AM  Result Value Ref Range Status   Specimen Description URINE, CLEAN CATCH  Final   Special Requests NONE  Final   Colony Count   Final    >=100,000 COLONIES/ML Performed at [facility]    Culture   Final    KLEBSIELLA PNEUMONIAE Performed at [facility]    Report Status 10/16/2014 FINAL  Final   Organism ID, Bacteria KLEBSIELLA PNEUMONIAE  Final      Susceptibility   Klebsiella pneumoniae - MIC*    AMPICILLIN RESISTANT      CEFAZOLIN <=4 SENSITIVE Sensitive     CEFTRIAXONE <=1 SENSITIVE Sensitive     CIPROFLOXACIN <=0.25 SENSITIVE Sensitive     GENTAMICIN <=1 SENSITIVE Sensitive     LEVOFLOXACIN <=0.12 SENSITIVE Sensitive     NITROFURANTOIN <=16 SENSITIVE Sensitive     TOBRAMYCIN <=1 SENSITIVE Sensitive     TRIMETH/SULFA <=20 SENSITIVE Sensitive     PIP/TAZO <=4 SENSITIVE Sensitive     * KLEBSIELLA PNEUMONIAE    [x]  Treated with Azithromycin, and metronidazole for infectious colitis   34 y/o F with N/V, and diarrhea was seen previously in the ED 1 week prior for same complaints. Endorses pain in her lower abdominal area. CT scan, and bloody bowel movement indicative of infectious colitis.  Pt is afebrile, and WBC WNL. UA is no suggestive of a UTI.  No additional treatment for UTI is needed.  ED Provider: Domenic Moras, PA-C  Angela Burke, PharmD Candidate

## 2014-10-31 ENCOUNTER — Ambulatory Visit: Payer: Medicaid Other | Admitting: Physician Assistant

## 2015-06-24 ENCOUNTER — Telehealth: Payer: Self-pay | Admitting: Physician Assistant

## 2015-06-24 NOTE — Telephone Encounter (Signed)
Pt called and states that she has had changes in the lump in her breast under her right arm since her biopsy. She contacted the breast center and they advised her to ask Olean Ree to order a diagnostic mammogram of both breasts.  Please advise. [phone]

## 2015-06-24 NOTE — Telephone Encounter (Signed)
Please advise 

## 2015-06-25 ENCOUNTER — Other Ambulatory Visit: Payer: Self-pay | Admitting: Family Medicine

## 2015-06-25 DIAGNOSIS — R2231 Localized swelling, mass and lump, right upper limb: Secondary | ICD-10-CM

## 2015-06-25 DIAGNOSIS — N6310 Unspecified lump in the right breast, unspecified quadrant: Secondary | ICD-10-CM

## 2015-06-25 NOTE — Telephone Encounter (Signed)
Can go ahead and place Order: According to the message Gabriel Cirri sent out recently-- we need to put in the following 3 orders: MM Diag breast tomo bilateral US breast limited unilateral inc L US breast limited unilateral inc R

## 2015-06-25 NOTE — Telephone Encounter (Signed)
Orders placed as requested.  Pt made aware.  Told OK to call Breast Center and they will schedule

## 2015-07-10 ENCOUNTER — Other Ambulatory Visit: Payer: Self-pay | Admitting: Physician Assistant

## 2015-07-16 ENCOUNTER — Ambulatory Visit
Admission: RE | Admit: 2015-07-16 | Discharge: 2015-07-16 | Disposition: A | Discharge status: Home / Self Care | Payer: Medicaid Other | Source: Ambulatory Visit | Attending: Physician Assistant | Admitting: Physician Assistant

## 2015-07-16 ENCOUNTER — Other Ambulatory Visit: Payer: Self-pay | Admitting: Physician Assistant

## 2015-07-16 DIAGNOSIS — R2231 Localized swelling, mass and lump, right upper limb: Secondary | ICD-10-CM

## 2015-07-16 DIAGNOSIS — N6310 Unspecified lump in the right breast, unspecified quadrant: Secondary | ICD-10-CM

## 2016-01-25 ENCOUNTER — Encounter (HOSPITAL_COMMUNITY): Payer: Self-pay | Admitting: Emergency Medicine

## 2016-01-25 ENCOUNTER — Emergency Department (HOSPITAL_COMMUNITY)
Admission: EM | Admit: 2016-01-25 | Discharge: 2016-01-26 | Disposition: A | Discharge status: Other Acute Inpatient Hospital | Payer: Medicaid Other | Attending: Emergency Medicine | Admitting: Emergency Medicine

## 2016-01-25 DIAGNOSIS — F330 Major depressive disorder, recurrent, mild: Secondary | ICD-10-CM

## 2016-01-25 DIAGNOSIS — Z79899 Other long term (current) drug therapy: Secondary | ICD-10-CM | POA: Insufficient documentation

## 2016-01-25 DIAGNOSIS — F1290 Cannabis use, unspecified, uncomplicated: Secondary | ICD-10-CM | POA: Diagnosis not present

## 2016-01-25 DIAGNOSIS — F17210 Nicotine dependence, cigarettes, uncomplicated: Secondary | ICD-10-CM | POA: Insufficient documentation

## 2016-01-25 DIAGNOSIS — T426X4A Poisoning by other antiepileptic and sedative-hypnotic drugs, undetermined, initial encounter: Secondary | ICD-10-CM | POA: Insufficient documentation

## 2016-01-25 DIAGNOSIS — F331 Major depressive disorder, recurrent, moderate: Secondary | ICD-10-CM | POA: Diagnosis present

## 2016-01-25 DIAGNOSIS — R4182 Altered mental status, unspecified: Secondary | ICD-10-CM | POA: Diagnosis present

## 2016-01-25 DIAGNOSIS — T50904A Poisoning by unspecified drugs, medicaments and biological substances, undetermined, initial encounter: Secondary | ICD-10-CM

## 2016-01-25 LAB — CBC WITH DIFFERENTIAL/PLATELET
BASOS ABS: 0 10*3/uL (ref 0.0–0.1)
BASOS PCT: 0 %
EOS PCT: 0 %
Eosinophils Absolute: 0 10*3/uL (ref 0.0–0.7)
HEMATOCRIT: 38.2 % (ref 36.0–46.0)
Hemoglobin: 13.1 g/dL (ref 12.0–15.0)
Lymphocytes Relative: 8 %
Lymphs Abs: 1.2 10*3/uL (ref 0.7–4.0)
MCH: 30.8 pg (ref 26.0–34.0)
MCHC: 34.3 g/dL (ref 30.0–36.0)
MCV: 89.7 fL (ref 78.0–100.0)
MONO ABS: 0.6 10*3/uL (ref 0.1–1.0)
Monocytes Relative: 4 %
NEUTROS ABS: 12.7 10*3/uL — AB (ref 1.7–7.7)
Neutrophils Relative %: 88 %
PLATELETS: 172 10*3/uL (ref 150–400)
RBC: 4.26 MIL/uL (ref 3.87–5.11)
RDW: 13.1 % (ref 11.5–15.5)
WBC: 14.5 10*3/uL — ABNORMAL HIGH (ref 4.0–10.5)

## 2016-01-25 MED ORDER — NALOXONE HCL 2 MG/2ML IJ SOSY
2.0000 mg | PREFILLED_SYRINGE | Freq: Once | INTRAMUSCULAR | Status: AC
  Administered 2016-01-25: 2 mg via INTRAVENOUS

## 2016-01-25 NOTE — ED Provider Notes (Signed)
[facility] DEPT Provider Note   CSN: [id number] Arrival date & time: 01/25/16  2236  First Provider Contact:  First MD Initiated Contact with Patient 01/25/16 2251        History   Chief Complaint Chief Complaint  Patient presents with  . Drug Overdose    HPI Susan Gross is a 35 y.o. female progress in by EMS after her family found her to have an altered level of consciousness. Reportedly they administered rescue breathing prior to EMS arrival. The patient was able to tell EMS that she had taken 4, 800 milligrams gabapentin tablets about 6 PM "for my back". The patient does have a history of chronic back and pelvic pain. She denies coingestions. She does have a history of a suicide attempt in the past. The patient is somnolent on arrival with decreased respiratory rate but will arouse to answer simple questions.  HPI  Past Medical History:  Diagnosis Date  . Anxiety   . Chronic back pain   . Chronic pelvic pain in female   . Depression   . Endometriosis   . Fibromyalgia   . Headache(784.0)   . Interstitial cystitis   . Kidney stone   . Ovarian cyst   . Suicide attempt [facility])     Patient Active Problem List   Diagnosis Date Noted  . Interstitial cystitis   . Chronic back pain   . Chronic pelvic pain in female   . ANXIETY 02/21/2007  . DEPRESSION 02/21/2007  . DYSLEXIA 02/21/2007    Past Surgical History:  Procedure Laterality Date  . ABDOMINAL HYSTERECTOMY    . HERNIA REPAIR      OB History    Gravida Para Term Preterm AB Living   3 2 2   1 2    SAB TAB Ectopic Multiple Live Births   1               Home Medications    Prior to Admission medications   Medication Sig Start Date End Date Taking? Authorizing Provider  gabapentin (NEURONTIN) 800 MG tablet Take 800 mg by mouth 4 (four) times daily as needed (back pain). Patient takes mothers medication (she is not prescribed this medication) 3-4 times a day for back pain.   Yes Historical Provider, MD   ibuprofen (ADVIL,MOTRIN) 200 MG tablet Take 800 mg by mouth every 6 (six) hours as needed (for pain.). pain    Yes Historical Provider, MD  polyvinyl alcohol (LIQUIFILM TEARS) 1.4 % ophthalmic solution Place 1 drop into both eyes 4 (four) times daily as needed for dry eyes.   Yes Historical Provider, MD    Family History Family History  Problem Relation Age of Onset  . Hypertension Mother   . Hypertension Father   . Muscular dystrophy Brother     Social History Social History  Substance Use Topics  . Smoking status: Current Every Day Smoker    Packs/day: 0.50    Years: 11.00    Types: Cigarettes  . Smokeless tobacco: Never Used  . Alcohol use No     Allergies   Ciprofloxacin   Review of Systems Review of Systems  Unable to perform ROS: Mental status change     Physical Exam Updated Vital Signs BP 114/77 (BP Location: Left Arm)   Pulse 67   Temp 97.6 F (36.4 C)   Resp 14   SpO2 100%   Physical Exam General: Well-developed, cachectic female in no acute distress; appearance consistent with age of record  HENT: normocephalic; atraumatic Eyes: pupils dilated but equal, round and reactive to light Neck: supple Heart: regular rate and rhythm Lungs: clear to auscultation bilaterally Abdomen: soft; nondistended; nontender; no masses or hepatosplenomegaly; bowel sounds present Extremities: No deformity; full range of motion; pulses normal Neurologic: Somnolent but partially arousable to voice; noted to move all extremities Skin: Warm and dry     ED Treatments / Results   EKG  EKG Interpretation  Date/Time:  Sunday January 25 2016 22:45:39 EDT Ventricular Rate:  77 PR Interval:    QRS Duration: 76 QT Interval:  406 QTC Calculation: 460 R Axis:   85 Text Interpretation:  Sinus rhythm RSR' in V1 or V2, probably normal variant No previous ECGs available Confirmed by Rubens Cranston  MD, Jenny Reichmann ([id number]) on 01/25/2016 10:49:27 PM     Nursing notes and vitals signs,  including pulse oximetry, reviewed.  Summary of this visit's results, reviewed by myself:  Labs:  Results for orders placed or performed during the hospital encounter of 01/25/16 (from the past 24 hour(s))  Magnesium     Status: None   Collection Time: 01/25/16 11:34 PM  Result Value Ref Range   Magnesium 1.8 1.7 - 2.4 mg/dL  Comprehensive metabolic panel     Status: Abnormal   Collection Time: 01/25/16 11:34 PM  Result Value Ref Range   Sodium 135 135 - 145 mmol/L   Potassium 3.3 (L) 3.5 - 5.1 mmol/L   Chloride 104 101 - 111 mmol/L   CO2 26 22 - 32 mmol/L   Glucose, Bld 91 65 - 99 mg/dL   BUN 8 6 - 20 mg/dL   Creatinine, Ser 0.39 (L) 0.44 - 1.00 mg/dL   Calcium 8.3 (L) 8.9 - 10.3 mg/dL   Total Protein 6.3 (L) 6.5 - 8.1 g/dL   Albumin 3.9 3.5 - 5.0 g/dL   AST 15 15 - 41 U/L   ALT 13 (L) 14 - 54 U/L   Alkaline Phosphatase 46 38 - 126 U/L   Total Bilirubin 0.8 0.3 - 1.2 mg/dL   GFR calc non Af Amer >60 >60 mL/min   GFR calc Af Amer >60 >60 mL/min   Anion gap 5 5 - 15  Acetaminophen level     Status: Abnormal   Collection Time: 01/25/16 11:34 PM  Result Value Ref Range   Acetaminophen (Tylenol), Serum <10 (L) 10 - 30 ug/mL  Salicylate level     Status: None   Collection Time: 01/25/16 11:34 PM  Result Value Ref Range   Salicylate Lvl 123456 2.8 - 30.0 mg/dL  CBC with Differential/Platelet     Status: Abnormal   Collection Time: 01/25/16 11:34 PM  Result Value Ref Range   WBC 14.5 (H) 4.0 - 10.5 K/uL   RBC 4.26 3.87 - 5.11 MIL/uL   Hemoglobin 13.1 12.0 - 15.0 g/dL   HCT 38.2 36.0 - 46.0 %   MCV 89.7 78.0 - 100.0 fL   MCH 30.8 26.0 - 34.0 pg   MCHC 34.3 30.0 - 36.0 g/dL   RDW 13.1 11.5 - 15.5 %   Platelets 172 150 - 400 K/uL   Neutrophils Relative % 88 %   Neutro Abs 12.7 (H) 1.7 - 7.7 K/uL   Lymphocytes Relative 8 %   Lymphs Abs 1.2 0.7 - 4.0 K/uL   Monocytes Relative 4 %   Monocytes Absolute 0.6 0.1 - 1.0 K/uL   Eosinophils Relative 0 %   Eosinophils Absolute 0.0  0.0 - 0.7 K/uL  Basophils Relative 0 %   Basophils Absolute 0.0 0.0 - 0.1 K/uL  Rapid urine drug screen (hospital performed)     Status: Abnormal   Collection Time: 01/25/16 11:58 PM  Result Value Ref Range   Opiates NONE DETECTED NONE DETECTED   Cocaine NONE DETECTED NONE DETECTED   Benzodiazepines NONE DETECTED NONE DETECTED   Amphetamines NONE DETECTED NONE DETECTED   Tetrahydrocannabinol POSITIVE (A) NONE DETECTED   Barbiturates NONE DETECTED NONE DETECTED  Pregnancy, urine     Status: None   Collection Time: 01/25/16 11:58 PM  Result Value Ref Range   Preg Test, Ur NEGATIVE NEGATIVE  Ethanol     Status: None   Collection Time: 01/26/16 12:34 AM  Result Value Ref Range   Alcohol, Ethyl (B) <5 <5 mg/dL    Procedures (including critical care time)  Medications Ordered in ED Medications  naloxone (NARCAN) injection 2 mg (2 mg Intravenous Given 01/25/16 2244)    Final Clinical Impressions(s) / ED Diagnoses   11:05 PM Equivocal increase in level of consciousness after Narcan 2 milligrams IV.  7:00 AM Patient still somnolent but arousable. Will have TTS evaluate her when able.  Final diagnoses:  Drug overdose, undetermined intent, initial encounter      Shanon Rosser, MD 01/26/16 [phone]

## 2016-01-25 NOTE — ED Notes (Signed)
Patient was placed on a bed pan for a period of time and was unable to provide one at this time.

## 2016-01-25 NOTE — ED Notes (Signed)
Per Lorri from [facility], recommendations include; CMP, ASA, Acetaminophen, Magnesium level, UDS, EKG and IVF.  Per Jovita Gamma pt is in therapeutic dose range of [phone] of gabapentin.  Will inform Dr. Florina Ou.

## 2016-01-25 NOTE — ED Triage Notes (Signed)
Pt BIB EMS from home; per EMS, pt took 4-800mg  pills of Gabapentin at 1830; pt is extremely lethargic on arrival; pt responds verbally with repeated verbal and physical stimuli; pupils reactive but sluggish. Family members called 911 due to pt's lethargic behavior.

## 2016-01-25 NOTE — ED Notes (Signed)
Bed: RESB Expected date:  Expected time:  Means of arrival:  Comments: 7 F OD

## 2016-01-26 ENCOUNTER — Encounter (HOSPITAL_COMMUNITY): Payer: Self-pay

## 2016-01-26 ENCOUNTER — Observation Stay (HOSPITAL_COMMUNITY)
Admission: AD | Admit: 2016-01-26 | Discharge: 2016-01-27 | Disposition: A | Discharge status: Home / Self Care | Payer: Medicaid Other | Source: Intra-hospital | Attending: Psychiatry | Admitting: Psychiatry

## 2016-01-26 ENCOUNTER — Encounter (HOSPITAL_COMMUNITY): Payer: Self-pay | Admitting: Emergency Medicine

## 2016-01-26 DIAGNOSIS — Z915 Personal history of self-harm: Secondary | ICD-10-CM | POA: Insufficient documentation

## 2016-01-26 DIAGNOSIS — Z818 Family history of other mental and behavioral disorders: Secondary | ICD-10-CM | POA: Insufficient documentation

## 2016-01-26 DIAGNOSIS — R480 Dyslexia and alexia: Secondary | ICD-10-CM | POA: Diagnosis not present

## 2016-01-26 DIAGNOSIS — Z87442 Personal history of urinary calculi: Secondary | ICD-10-CM | POA: Diagnosis not present

## 2016-01-26 DIAGNOSIS — R51 Headache: Secondary | ICD-10-CM | POA: Diagnosis not present

## 2016-01-26 DIAGNOSIS — M797 Fibromyalgia: Secondary | ICD-10-CM | POA: Insufficient documentation

## 2016-01-26 DIAGNOSIS — F419 Anxiety disorder, unspecified: Secondary | ICD-10-CM | POA: Insufficient documentation

## 2016-01-26 DIAGNOSIS — F1290 Cannabis use, unspecified, uncomplicated: Secondary | ICD-10-CM | POA: Insufficient documentation

## 2016-01-26 DIAGNOSIS — F330 Major depressive disorder, recurrent, mild: Secondary | ICD-10-CM | POA: Insufficient documentation

## 2016-01-26 DIAGNOSIS — F323 Major depressive disorder, single episode, severe with psychotic features: Principal | ICD-10-CM | POA: Insufficient documentation

## 2016-01-26 DIAGNOSIS — M549 Dorsalgia, unspecified: Secondary | ICD-10-CM | POA: Insufficient documentation

## 2016-01-26 DIAGNOSIS — F331 Major depressive disorder, recurrent, moderate: Secondary | ICD-10-CM | POA: Diagnosis present

## 2016-01-26 DIAGNOSIS — G8929 Other chronic pain: Secondary | ICD-10-CM | POA: Insufficient documentation

## 2016-01-26 DIAGNOSIS — Z8249 Family history of ischemic heart disease and other diseases of the circulatory system: Secondary | ICD-10-CM | POA: Diagnosis not present

## 2016-01-26 DIAGNOSIS — Z881 Allergy status to other antibiotic agents status: Secondary | ICD-10-CM | POA: Insufficient documentation

## 2016-01-26 LAB — ETHANOL

## 2016-01-26 LAB — RAPID URINE DRUG SCREEN, HOSP PERFORMED
Amphetamines: NOT DETECTED
BENZODIAZEPINES: NOT DETECTED
Barbiturates: NOT DETECTED
COCAINE: NOT DETECTED
OPIATES: NOT DETECTED
Tetrahydrocannabinol: POSITIVE — AB

## 2016-01-26 LAB — COMPREHENSIVE METABOLIC PANEL
ALK PHOS: 46 U/L (ref 38–126)
ALT: 13 U/L — ABNORMAL LOW (ref 14–54)
ANION GAP: 5 (ref 5–15)
AST: 15 U/L (ref 15–41)
Albumin: 3.9 g/dL (ref 3.5–5.0)
BILIRUBIN TOTAL: 0.8 mg/dL (ref 0.3–1.2)
BUN: 8 mg/dL (ref 6–20)
CALCIUM: 8.3 mg/dL — AB (ref 8.9–10.3)
CO2: 26 mmol/L (ref 22–32)
Chloride: 104 mmol/L (ref 101–111)
Creatinine, Ser: 0.39 mg/dL — ABNORMAL LOW (ref 0.44–1.00)
GFR calc non Af Amer: >60 mL/min (ref >60)
Glucose, Bld: 91 mg/dL (ref 65–99)
Potassium: 3.3 mmol/L — ABNORMAL LOW (ref 3.5–5.1)
SODIUM: 135 mmol/L (ref 135–145)
TOTAL PROTEIN: 6.3 g/dL — AB (ref 6.5–8.1)

## 2016-01-26 LAB — MAGNESIUM: Magnesium: 1.8 mg/dL (ref 1.7–2.4)

## 2016-01-26 LAB — ACETAMINOPHEN LEVEL: Acetaminophen (Tylenol), Serum: <10 ug/mL — ABNORMAL LOW (ref 10–30)

## 2016-01-26 LAB — SALICYLATE LEVEL: Salicylate Lvl: <4 mg/dL (ref 2.8–30.0)

## 2016-01-26 LAB — PREGNANCY, URINE: Preg Test, Ur: NEGATIVE

## 2016-01-26 MED ORDER — TRAZODONE HCL 50 MG PO TABS
50.0000 mg | ORAL_TABLET | Freq: Every evening | ORAL | Status: DC | PRN
  Administered 2016-01-26: 50 mg via ORAL
  Filled 2016-01-26: qty 1

## 2016-01-26 MED ORDER — BUPROPION HCL ER (XL) 150 MG PO TB24
150.0000 mg | ORAL_TABLET | Freq: Every day | ORAL | Status: DC
  Administered 2016-01-27: 150 mg via ORAL
  Filled 2016-01-26: qty 1

## 2016-01-26 MED ORDER — POLYVINYL ALCOHOL 1.4 % OP SOLN
1.0000 [drp] | Freq: Four times a day (QID) | OPHTHALMIC | Status: DC | PRN
  Filled 2016-01-26: qty 15

## 2016-01-26 MED ORDER — GABAPENTIN 800 MG PO TABS: 800.0000 mg | ORAL_TABLET | Freq: Four times a day (QID) | ORAL | Status: DC | PRN

## 2016-01-26 MED ORDER — ACETAMINOPHEN 325 MG PO TABS
650.0000 mg | ORAL_TABLET | Freq: Four times a day (QID) | ORAL | Status: DC | PRN
  Administered 2016-01-26 – 2016-01-27 (×2): 650 mg via ORAL
  Filled 2016-01-26 (×2): qty 2

## 2016-01-26 MED ORDER — BUPROPION HCL ER (XL) 150 MG PO TB24
150.0000 mg | ORAL_TABLET | Freq: Every day | ORAL | Status: DC
  Administered 2016-01-26: 150 mg via ORAL
  Filled 2016-01-26: qty 1

## 2016-01-26 MED ORDER — GABAPENTIN 800 MG PO TABS: 400.0000 mg | ORAL_TABLET | Freq: Two times a day (BID) | ORAL | Status: DC

## 2016-01-26 MED ORDER — TRAZODONE HCL 50 MG PO TABS: 50.0000 mg | ORAL_TABLET | Freq: Every evening | ORAL | Status: DC | PRN

## 2016-01-26 MED ORDER — IBUPROFEN 800 MG PO TABS: 800.0000 mg | ORAL_TABLET | Freq: Four times a day (QID) | ORAL | Status: DC | PRN

## 2016-01-26 MED ORDER — NICOTINE 21 MG/24HR TD PT24
21.0000 mg | MEDICATED_PATCH | Freq: Every day | TRANSDERMAL | Status: DC
  Administered 2016-01-26 – 2016-01-27 (×2): 21 mg via TRANSDERMAL
  Filled 2016-01-26 (×2): qty 1

## 2016-01-26 MED ORDER — GABAPENTIN 600 MG PO TABS
600.0000 mg | ORAL_TABLET | Freq: Three times a day (TID) | ORAL | Status: DC
  Filled 2016-01-26 (×2): qty 1

## 2016-01-26 MED ORDER — GABAPENTIN 300 MG PO CAPS
600.0000 mg | ORAL_CAPSULE | Freq: Three times a day (TID) | ORAL | Status: DC
  Administered 2016-01-26: 600 mg via ORAL
  Filled 2016-01-26: qty 2

## 2016-01-26 MED ORDER — HYDROXYZINE HCL 25 MG PO TABS: 25.0000 mg | ORAL_TABLET | Freq: Three times a day (TID) | ORAL | Status: DC | PRN

## 2016-01-26 MED ORDER — MAGNESIUM HYDROXIDE 400 MG/5ML PO SUSP: 30.0000 mL | Freq: Every day | ORAL | Status: DC | PRN

## 2016-01-26 MED ORDER — POTASSIUM CHLORIDE CRYS ER 20 MEQ PO TBCR
20.0000 meq | EXTENDED_RELEASE_TABLET | Freq: Two times a day (BID) | ORAL | Status: DC
  Administered 2016-01-26 – 2016-01-27 (×2): 20 meq via ORAL
  Filled 2016-01-26 (×6): qty 1

## 2016-01-26 MED ORDER — ALUM & MAG HYDROXIDE-SIMETH 200-200-20 MG/5ML PO SUSP: 30.0000 mL | ORAL | Status: DC | PRN

## 2016-01-26 MED ORDER — GABAPENTIN 400 MG PO CAPS
400.0000 mg | ORAL_CAPSULE | Freq: Two times a day (BID) | ORAL | Status: DC
  Administered 2016-01-26 – 2016-01-27 (×2): 400 mg via ORAL
  Filled 2016-01-26 (×2): qty 1

## 2016-01-26 MED ORDER — IBUPROFEN 200 MG PO TABS
400.0000 mg | ORAL_TABLET | Freq: Once | ORAL | Status: AC
  Administered 2016-01-26: 400 mg via ORAL
  Filled 2016-01-26: qty 2

## 2016-01-26 NOTE — BH Assessment (Signed)
[facility] Assessment Progress Note  Per Corena Pilgrim, MD, this pt would benefit from admission to the [facility] Observation Unit at this time.  Ria Comment, RN, [facility] has assigned pt to Obs 5.  Pt has signed Voluntary Admission and Consent for Treatment, as well as Consent to Release Information to her fiance, and signed forms have been faxed to [facility].  Pt's nurse, Diane, has been notified, and agrees to send original paperwork along with pt via [facility], and to call report to [phone] or [phone].  Jalene Mullet, [facility] Triage Specialist [phone]
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
## 2016-01-26 NOTE — Progress Notes (Signed)
Susan Gross 35 yo Female is in bed #5 in observation unit.  Pt is alert, awake and cooperative, responds to questions and makes good eye contact.  Pt sts she smells and wants a shower.  Pt sts she feels onset of another HA.  Pt asks for snacks and soda.  Pt denies SI but indorses some AH/VH of hearing garbled voices and sees shadows.  Pt given emotional support.  Pt received PRN med for HA, snacks and sodas.  Pt given towels and shampoo for shower. Pt monitored continuously for safety while on unit except when in the bathroom. Pt remains safe on the unit.

## 2016-01-26 NOTE — ED Notes (Signed)
Pt continues to lie in bed with no acute distress.  Easy rise and fall of chest.  VSS within normal limits. Will continue to monitor.

## 2016-01-26 NOTE — ED Notes (Signed)
Patient's clothes taken home by fiance.  No cell phone, wallet or purse in her belongings - fiance and patient say they are at home.

## 2016-01-26 NOTE — ED Notes (Signed)
Follow up from poison control and poison control to close out case.

## 2016-01-26 NOTE — ED Notes (Signed)
Pt said that she was not trying to kill herself when she took the four Neurontin. She said that she is suffering from chronic headaches and was just trying to get pain relief. She also has back pain. Denies SI/HI, is not delusional and does not appear to be responding to internal stimuli.

## 2016-01-26 NOTE — Progress Notes (Signed)
Admission Note: Admitted patient, a 35 y/o female, to [facility] OBS Unit.  On arrival patient awake and alert. She appears anxious.  She denies suicidal and homicidal ideation and AVH;  She reports that she took four Gabapentin capsules last night because she had back pain.  She denies that this was a suicide attempt or an attempt to hurt herself.  Patient had no belongings with her on arrival to unit.  She states she sent all her belongings home when she was in the ED.  She reports that she lives with her fiance.  She reports that her main source of social/emotional support is from her 62 y/o daughter.  She denies alcohol use.  She states that she smokes cigarettes 2ppd and uses marijuana occasionallyl. Skin assessment done with [place], MHT.  Pt has multiple tattoos and a pierced navel.  No contraband found.  She was oriented to OBS Unit and plan of care.  Lunch provided to patient.  Emotional support offered.  Encouraged her to seek assistance with needs/concerns.

## 2016-01-26 NOTE — Progress Notes (Signed)
Contacted [facility] in [place].  [phone].  Was asked by NP to make appointment at [phone].  Received information from Beather Arbour who we should call for an appointment.  Called twice and received voicemail each time.  Left message for someone to call Observation for an appointment for this patient.

## 2016-01-26 NOTE — ED Provider Notes (Signed)
Seen and evaluated by TTS. Dispo pending AM psychiatric recommendations. Medically clear at this time   Jola Schmidt, MD 01/26/16 [phone]
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
## 2016-01-26 NOTE — ED Notes (Signed)
Pt transported to [facility] Observation by [employer]. All belongings returned to pt who signed for same.

## 2016-01-27 DIAGNOSIS — F331 Major depressive disorder, recurrent, moderate: Secondary | ICD-10-CM | POA: Diagnosis not present

## 2016-01-27 DIAGNOSIS — F323 Major depressive disorder, single episode, severe with psychotic features: Secondary | ICD-10-CM | POA: Diagnosis not present

## 2016-01-27 MED ORDER — BUPROPION HCL ER (XL) 150 MG PO TB24: 150.0000 mg | ORAL_TABLET | Freq: Every day | ORAL | 0 refills | Status: DC

## 2016-01-27 MED ORDER — TRAZODONE HCL 50 MG PO TABS: 50.0000 mg | ORAL_TABLET | Freq: Every evening | ORAL | 0 refills | Status: DC | PRN

## 2016-01-27 MED ORDER — GABAPENTIN 400 MG PO CAPS: 400.0000 mg | ORAL_CAPSULE | Freq: Two times a day (BID) | ORAL | 0 refills | Status: DC

## 2016-01-27 NOTE — [facility] Counselor (Signed)
OBS Counselor scheduled an appointment for this pt at her PCP ([place]) for a medical follow up. The appointment is scheduled for Thursday, February 12, 2016 @ 9am. Pt is expected to bring a copy of her Medicaid card.   Redmond Pulling, MA OBS Counselor

## 2016-01-27 NOTE — [facility] Counselor (Signed)
OBS Counselor scheduled an OPT appointment for this pt at [facility] in [facility] in [place]. The appointment is scheduled for Thursday, February 05, 2016 @ 9am. Pt is expected to arrive at the appointment at least 15 minutes early to complete paperwork. Pt is also expected to bring her photo ID, along with her current Medicaid card, and reading glasses if appropriate. Pt's appointment was scheduled on the out side of the required time frame of her hospital date due to pt not wanting to miss anymore time from work. Pt currently denies SI/HI at this time  Susan Pulling, MA OBS Counselor

## 2016-01-27 NOTE — Progress Notes (Signed)
D:  Patient pleasant and cooperative; affect flat; mood depressed; she denies suicidal and homicidal ideation and AVH;  No self-injurious behaviors noted or reported. A:  Medications given as ordered; encouraged patient to seek assistance with needs/concerns; emotional support provided. R:  Safety maintained on unit.

## 2016-01-27 NOTE — [facility] Counselor (Signed)
This Probation officer spoke with pt this morning to discuss disposition recommendations that were provided by Ricky Ala, NP. This Probation officer communicated with pt in regards to scheduling an OPT follow up appointment upon discharge and an appointment with pt's PCP ([place]). Pt provided this Probation officer with consent to release her information by signing a ROI. This Probation officer will make telephone contact with appropriate resources to ensure that continuity of care is continued post discharge.
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
## 2016-01-27 NOTE — Progress Notes (Signed)
Written/verbal discharge instructions, prescriptions, work note and follow-up appointments given to patient with verbalization of understanding;  Patient denies suicidal and homicidal ideation.  Discharged home with fiance in stable condition. Patient had no belongings to return.

## 2016-02-12 ENCOUNTER — Ambulatory Visit: Payer: Medicaid Other | Admitting: Physician Assistant

## 2016-06-22 ENCOUNTER — Encounter (HOSPITAL_COMMUNITY): Payer: Self-pay | Admitting: Emergency Medicine

## 2016-06-22 ENCOUNTER — Emergency Department (HOSPITAL_COMMUNITY)
Admission: EM | Admit: 2016-06-22 | Discharge: 2016-06-23 | Disposition: A | Discharge status: Home / Self Care | Payer: Medicaid Other | Attending: Emergency Medicine | Admitting: Emergency Medicine

## 2016-06-22 ENCOUNTER — Emergency Department (HOSPITAL_COMMUNITY): Payer: Medicaid Other

## 2016-06-22 DIAGNOSIS — R51 Headache: Secondary | ICD-10-CM | POA: Insufficient documentation

## 2016-06-22 DIAGNOSIS — Z79899 Other long term (current) drug therapy: Secondary | ICD-10-CM | POA: Diagnosis not present

## 2016-06-22 DIAGNOSIS — F17210 Nicotine dependence, cigarettes, uncomplicated: Secondary | ICD-10-CM | POA: Insufficient documentation

## 2016-06-22 DIAGNOSIS — Z791 Long term (current) use of non-steroidal anti-inflammatories (NSAID): Secondary | ICD-10-CM | POA: Insufficient documentation

## 2016-06-22 DIAGNOSIS — R109 Unspecified abdominal pain: Secondary | ICD-10-CM

## 2016-06-22 DIAGNOSIS — N83202 Unspecified ovarian cyst, left side: Secondary | ICD-10-CM | POA: Insufficient documentation

## 2016-06-22 DIAGNOSIS — R1032 Left lower quadrant pain: Secondary | ICD-10-CM | POA: Diagnosis present

## 2016-06-22 LAB — URINALYSIS, ROUTINE W REFLEX MICROSCOPIC
Bilirubin Urine: NEGATIVE
GLUCOSE, UA: NEGATIVE mg/dL
Ketones, ur: NEGATIVE mg/dL
Leukocytes, UA: NEGATIVE
Nitrite: NEGATIVE
Protein, ur: NEGATIVE mg/dL
pH: 7 (ref 5.0–8.0)

## 2016-06-22 LAB — CBC WITH DIFFERENTIAL/PLATELET
Basophils Absolute: 0 10*3/uL (ref 0.0–0.1)
Basophils Relative: 0 %
EOS PCT: 1 %
Eosinophils Absolute: 0.1 10*3/uL (ref 0.0–0.7)
HCT: 39.6 % (ref 36.0–46.0)
Hemoglobin: 13.6 g/dL (ref 12.0–15.0)
LYMPHS ABS: 2.5 10*3/uL (ref 0.7–4.0)
LYMPHS PCT: 42 %
MCH: 31.4 pg (ref 26.0–34.0)
MCHC: 34.3 g/dL (ref 30.0–36.0)
MCV: 91.5 fL (ref 78.0–100.0)
MONO ABS: 0.4 10*3/uL (ref 0.1–1.0)
MONOS PCT: 7 %
Neutro Abs: 2.9 10*3/uL (ref 1.7–7.7)
Neutrophils Relative %: 50 %
PLATELETS: 163 10*3/uL (ref 150–400)
RBC: 4.33 MIL/uL (ref 3.87–5.11)
RDW: 13.4 % (ref 11.5–15.5)
WBC: 5.9 10*3/uL (ref 4.0–10.5)

## 2016-06-22 LAB — BASIC METABOLIC PANEL
Anion gap: 4 — ABNORMAL LOW (ref 5–15)
BUN: 11 mg/dL (ref 6–20)
CALCIUM: 8.4 mg/dL — AB (ref 8.9–10.3)
CO2: 26 mmol/L (ref 22–32)
Chloride: 108 mmol/L (ref 101–111)
Creatinine, Ser: 0.59 mg/dL (ref 0.44–1.00)
GFR calc Af Amer: >60 mL/min (ref >60)
GLUCOSE: 95 mg/dL (ref 65–99)
Potassium: 3.6 mmol/L (ref 3.5–5.1)
Sodium: 138 mmol/L (ref 135–145)

## 2016-06-22 LAB — URINALYSIS, MICROSCOPIC (REFLEX)

## 2016-06-22 MED ORDER — ONDANSETRON HCL 4 MG/2ML IJ SOLN
4.0000 mg | Freq: Once | INTRAMUSCULAR | Status: AC
  Administered 2016-06-22: 4 mg via INTRAVENOUS
  Filled 2016-06-22: qty 2

## 2016-06-22 MED ORDER — SODIUM CHLORIDE 0.9 % IV SOLN: INTRAVENOUS | Status: DC

## 2016-06-22 MED ORDER — HYDROMORPHONE HCL 1 MG/ML IJ SOLN
1.0000 mg | Freq: Once | INTRAMUSCULAR | Status: AC
  Administered 2016-06-22: 1 mg via INTRAVENOUS
  Filled 2016-06-22: qty 1

## 2016-06-22 MED ORDER — SODIUM CHLORIDE 0.9 % IV BOLUS (SEPSIS)
1000.0000 mL | Freq: Once | INTRAVENOUS | Status: AC
  Administered 2016-06-22: 1000 mL via INTRAVENOUS

## 2016-06-22 NOTE — ED Provider Notes (Addendum)
[facility] DEPT Provider Note   CSN: [id number] Arrival date & time: 06/22/16  1334  By signing my name below, I, Susan Gross, attest that this documentation has been prepared under the direction and in the presence of Fredia Sorrow, MD . Electronically Signed: Jeanell Gross, Scribe. 06/22/2016. 9:59 PM.  History   Chief Complaint Chief Complaint  Patient presents with  . Flank Pain   The history is provided by the patient. No language interpreter was used.  Flank Pain  This is a new problem. The current episode started yesterday. The problem occurs constantly. The problem has not changed since onset.Associated symptoms include abdominal pain and headaches. Pertinent negatives include no chest pain and no shortness of breath. Nothing aggravates the symptoms. Nothing relieves the symptoms. She has tried nothing for the symptoms.   HPI Comments: Susan Gross is a 35 y.o. female with a PMHx of kidney stones who presents to the Emergency Department complaining of constant moderate left flank pain that started yesterday. She states she woke up with pain at about 8am. She currently rates the pain as a 6/10. She reports associated symptoms of nausea, vomiting (2 episodes), mild dysuria, and right-sided abdominal pain. She reports no modifying factors. She denies any diarrhea.     PCP: Karis Juba, PA-C  Past Medical History:  Diagnosis Date  . Anxiety   . Chronic back pain   . Chronic pelvic pain in female   . Depression   . Endometriosis   . Fibromyalgia   . Headache(784.0)   . Interstitial cystitis   . Kidney stone   . Ovarian cyst   . Suicide attempt     Patient Active Problem List   Diagnosis Date Noted  . Major depressive disorder, recurrent episode, moderate degree ([place]) 01/26/2016  . Major depressive disorder, recurrent episode, moderate ([place]) 01/26/2016  . Interstitial cystitis   . Chronic back pain   . Chronic pelvic pain in female   . ANXIETY  02/21/2007  . DEPRESSION 02/21/2007  . DYSLEXIA 02/21/2007    Past Surgical History:  Procedure Laterality Date  . ABDOMINAL HYSTERECTOMY    . HERNIA REPAIR      OB History    Gravida Para Term Preterm AB Living   3 2 2   1 2    SAB TAB Ectopic Multiple Live Births   1               Home Medications    Prior to Admission medications   Medication Sig Start Date End Date Taking? Authorizing Provider  gabapentin (NEURONTIN) 400 MG capsule Take 1 capsule (400 mg total) by mouth 2 (two) times daily. 01/27/16  Yes Niel Hummer, NP  ibuprofen (ADVIL,MOTRIN) 200 MG tablet Take 800 mg by mouth every 6 (six) hours as needed (for pain.). pain    Yes Historical Provider, MD  polyvinyl alcohol (LIQUIFILM TEARS) 1.4 % ophthalmic solution Place 1 drop into both eyes 4 (four) times daily as needed for dry eyes.   Yes Historical Provider, MD    Family History Family History  Problem Relation Age of Onset  . Hypertension Mother   . Hypertension Father   . Muscular dystrophy Brother     Social History Social History  Substance Use Topics  . Smoking status: Current Every Day Smoker    Packs/day: 2.00    Years: 15.00    Types: Cigarettes  . Smokeless tobacco: Never Used  . Alcohol use No     Allergies  Ciprofloxacin   Review of Systems Review of Systems  Constitutional: Negative for chills and fever.  HENT: Negative for congestion, rhinorrhea and sore throat.   Eyes: Negative for visual disturbance.  Respiratory: Negative for cough and shortness of breath.   Cardiovascular: Negative for chest pain and leg swelling.  Gastrointestinal: Positive for abdominal pain, nausea and vomiting. Negative for diarrhea.  Genitourinary: Positive for dysuria and flank pain (Left). Negative for hematuria.  Musculoskeletal: Positive for back pain (Lower left).  Skin: Negative for rash.  Neurological: Positive for headaches.  Hematological: Does not bruise/bleed easily.    Psychiatric/Behavioral: Negative for confusion.     Physical Exam Updated Vital Signs BP 137/87 (BP Location: Left Arm)   Pulse 77   Temp 98 F (36.7 C) (Oral)   Resp 20   SpO2 100%   Physical Exam  Constitutional: She appears well-developed and well-nourished. No distress.  HENT:  Head: Normocephalic and atraumatic.  Eyes: Conjunctivae and EOM are normal. Pupils are equal, round, and reactive to light. No scleral icterus.  Sclerae are clear. Mucous membranes are mildly dry.   Neck: Neck supple.  Cardiovascular: Normal rate and regular rhythm.   Pulmonary/Chest: Effort normal. No respiratory distress. She has no wheezes. She has no rales.  SpO2 is 100% on RA.   Abdominal: Soft. Bowel sounds are normal. There is tenderness. There is no guarding.  TTP to LLQ.   Musculoskeletal: Normal range of motion. She exhibits no edema.  Neurological: She is alert.  Skin: Skin is warm and dry.  Psychiatric: She has a normal mood and affect.  Nursing note and vitals reviewed.    ED Treatments / Results  DIAGNOSTIC STUDIES: Oxygen Saturation is 100% on RA, normal by my interpretation.    COORDINATION OF CARE: 10:03 PM- Pt advised of plan for treatment and pt agrees.  Labs (all labs ordered are listed, but only abnormal results are displayed) Labs Reviewed  URINALYSIS, ROUTINE W REFLEX MICROSCOPIC - Abnormal; Notable for the following:       Result Value   APPearance HAZY (*)    Specific Gravity, Urine <1.005 (*)    Hgb urine dipstick SMALL (*)    All other components within normal limits  BASIC METABOLIC PANEL - Abnormal; Notable for the following:    Calcium 8.4 (*)    Anion gap 4 (*)    All other components within normal limits  URINALYSIS, MICROSCOPIC (REFLEX) - Abnormal; Notable for the following:    Bacteria, UA MANY (*)    Squamous Epithelial / LPF TOO NUMEROUS TO COUNT (*)    All other components within normal limits  CBC WITH DIFFERENTIAL/PLATELET   Results for  orders placed or performed during the hospital encounter of 06/22/16  Urinalysis, Routine w reflex microscopic- may I&O cath if menses  Result Value Ref Range   Color, Urine YELLOW YELLOW   APPearance HAZY (A) CLEAR   Specific Gravity, Urine <1.005 (L) 1.005 - 1.030   pH 7.0 5.0 - 8.0   Glucose, UA NEGATIVE NEGATIVE mg/dL   Hgb urine dipstick SMALL (A) NEGATIVE   Bilirubin Urine NEGATIVE NEGATIVE   Ketones, ur NEGATIVE NEGATIVE mg/dL   Protein, ur NEGATIVE NEGATIVE mg/dL   Nitrite NEGATIVE NEGATIVE   Leukocytes, UA NEGATIVE NEGATIVE  CBC with Differential/Platelet  Result Value Ref Range   WBC 5.9 4.0 - 10.5 K/uL   RBC 4.33 3.87 - 5.11 MIL/uL   Hemoglobin 13.6 12.0 - 15.0 g/dL   HCT 39.6 36.0 -  46.0 %   MCV 91.5 78.0 - 100.0 fL   MCH 31.4 26.0 - 34.0 pg   MCHC 34.3 30.0 - 36.0 g/dL   RDW 13.4 11.5 - 15.5 %   Platelets 163 150 - 400 K/uL   Neutrophils Relative % 50 %   Neutro Abs 2.9 1.7 - 7.7 K/uL   Lymphocytes Relative 42 %   Lymphs Abs 2.5 0.7 - 4.0 K/uL   Monocytes Relative 7 %   Monocytes Absolute 0.4 0.1 - 1.0 K/uL   Eosinophils Relative 1 %   Eosinophils Absolute 0.1 0.0 - 0.7 K/uL   Basophils Relative 0 %   Basophils Absolute 0.0 0.0 - 0.1 K/uL  Basic metabolic panel  Result Value Ref Range   Sodium 138 135 - 145 mmol/L   Potassium 3.6 3.5 - 5.1 mmol/L   Chloride 108 101 - 111 mmol/L   CO2 26 22 - 32 mmol/L   Glucose, Bld 95 65 - 99 mg/dL   BUN 11 6 - 20 mg/dL   Creatinine, Ser 0.59 0.44 - 1.00 mg/dL   Calcium 8.4 (L) 8.9 - 10.3 mg/dL   GFR calc non Af Amer >60 >60 mL/min   GFR calc Af Amer >60 >60 mL/min   Anion gap 4 (L) 5 - 15  Urinalysis, Microscopic (reflex)  Result Value Ref Range   RBC / HPF 0-5 0 - 5 RBC/hpf   WBC, UA 0-5 0 - 5 WBC/hpf   Bacteria, UA MANY (A) NONE SEEN   Squamous Epithelial / LPF TOO NUMEROUS TO COUNT (A) NONE SEEN     EKG  EKG Interpretation None       Radiology Ct Renal Stone Study  Result Date: 06/22/2016 CLINICAL  DATA:  Left flank pain, onset yesterday. EXAM: CT ABDOMEN AND PELVIS WITHOUT CONTRAST TECHNIQUE: Multidetector CT imaging of the abdomen and pelvis was performed following the standard protocol without IV contrast. COMPARISON:  Contrast-enhanced CT 4186 FINDINGS: Lower chest: No lung bases are clear. Hepatobiliary: No focal hepatic lesion allowing for noncontrast exam. Gallbladder is decompressed without calcified stone. No biliary dilatation. Pancreas: Not well evaluated due to lack of contrast and paucity of intra- abdominal fat. No ductal dilatation or inflammation. Spleen: Normal in size without focal abnormality. Adrenals/Urinary Tract: Punctate bilateral nonobstructing nephrolithiasis. No hydronephrosis or perinephric edema. Simple cysts in the mid left kidney, grossly unchanged from prior. Metallic densities in the left retroperitoneum at the level of of the left renal pelvis were not present on prior exam, and may be surgical clips. No ureteral calculi, ureters not well-defined. Bilateral pelvic phleboliths are again seen. Urinary bladder is minimally distended, no bladder stone. Stomach/Bowel: Stomach is physiologically distended. No evidence of bowel inflammation or distention. Evaluation limited by lack of contrast and paucity of intra-abdominal fat. Cecum low lying in the pelvis, the appendix is tentatively but not confidently identified. No pericecal inflammation. Vascular/Lymphatic: No significant vascular findings are present. No enlarged abdominal or pelvic lymph nodes. Reproductive: Post hysterectomy. There is a 2.9 cm cyst in the left ovary. Right ovary not confidently visualized. Other: No abdominal wall hernia or abnormality. No abdominopelvic ascites. Musculoskeletal: There are no acute or suspicious osseous abnormalities. IMPRESSION: 1. Bilateral nonobstructing renal calculi. No hydronephrosis or obstructive uropathy. 2. Left ovarian cyst measures 2.9 cm. Electronically Signed   By: Jeb Levering M.D.   On: 06/22/2016 23:19    Procedures Procedures (including critical care time)  Medications Ordered in ED Medications  0.9 %  sodium chloride  infusion (not administered)  sodium chloride 0.9 % bolus 1,000 mL (1,000 mLs Intravenous New Bag/Given 06/22/16 2248)  ondansetron (ZOFRAN) injection 4 mg (4 mg Intravenous Given 06/22/16 2248)  HYDROmorphone (DILAUDID) injection 1 mg (1 mg Intravenous Given 06/22/16 2248)     Initial Impression / Assessment and Plan / ED Course  I have reviewed the triage vital signs and the nursing notes.  Pertinent labs & imaging results that were available during my care of the patient were reviewed by me and considered in my medical decision making (see chart for details).  Clinical Course    Workup for the acute flank pain left side without evidence of ureteral stone. Urinalysis normal no leukocytosis. Electrolytes without any sniffing abnormalities. Kidney function normal. Will treat symptomatically for pain. Will treat with anti-inflammatory medicine. And for the nausea and vomiting.   Final Clinical Impressions(s) / ED Diagnoses   Final diagnoses:  Flank pain  Left flank pain  Cyst of left ovary    New Prescriptions New Prescriptions   No medications on file   I personally performed the services described in this documentation, which was scribed in my presence. The recorded information has been reviewed and is accurate.       Fredia Sorrow, MD 06/22/16 2358    Fredia Sorrow, MD 06/23/16 [phone]

## 2016-06-22 NOTE — ED Triage Notes (Signed)
Pt reports L sided flank pain with n/v that started yesterday. Pt has prior hx of kidney stones.

## 2016-06-23 MED ORDER — NAPROXEN 500 MG PO TABS: 500.0000 mg | ORAL_TABLET | Freq: Two times a day (BID) | ORAL | 0 refills | Status: DC

## 2016-06-23 MED ORDER — PROMETHAZINE HCL 25 MG PO TABS: 25.0000 mg | ORAL_TABLET | Freq: Four times a day (QID) | ORAL | 1 refills | Status: DC | PRN

## 2016-06-23 NOTE — Discharge Instructions (Signed)
Take the Naprosyn every 12 hours for the next 7 days. Take Phenergan as needed for nausea and vomiting. Work note provided to be out of work for the next 2 days. Return for any new or worse symptoms. Workup without any findings other than a left ovarian cyst. Which possibly could be causing the pain. No evidence of any of kidney stones in the ureter.

## 2016-06-30 ENCOUNTER — Ambulatory Visit (INDEPENDENT_AMBULATORY_CARE_PROVIDER_SITE_OTHER): Payer: Medicaid Other | Admitting: Physician Assistant

## 2016-06-30 ENCOUNTER — Encounter: Payer: Self-pay | Admitting: Physician Assistant

## 2016-06-30 VITALS — BP 102/68 | HR 102 | Temp 98.9°F | Resp 18 | Ht 61.0 in | Wt 91.0 lb

## 2016-06-30 DIAGNOSIS — N83202 Unspecified ovarian cyst, left side: Secondary | ICD-10-CM | POA: Diagnosis not present

## 2016-06-30 DIAGNOSIS — M25531 Pain in right wrist: Secondary | ICD-10-CM | POA: Diagnosis not present

## 2016-06-30 MED ORDER — TRAMADOL HCL 50 MG PO TABS: ORAL_TABLET | ORAL | 0 refills | Status: DC

## 2016-06-30 NOTE — Progress Notes (Signed)
Patient ID: Susan Gross MRN: [id number], DOB: 10-18-1980, 36 y.o. Date of Encounter: @DATE @  Chief Complaint:  Chief Complaint  Patient presents with  . Referral    has been seen in ER x1 week prior- was Dx with ovarian cyst- needs to see about removal  . Referral    needs referral for R hand/ finger pain    HPI: 36 y.o. year old female  presents with above.   Reviewed her ER note from [facility] 06/22/16. At times she presented complaining of left flank pain that has started the prior day. Woke up with pain around 8 AM. Rated the pain as a 6/10. Ported associated symptoms of nausea vomiting. Urine and labs were obtained. CT showed bilateral nonobstructing renal calculi. Left ovarian cyst measures 2.9 cm. She was treated with IV fluid IV Zofran and Dilaudid. At this time she needs referral to GYN. Says that she has not seen a GYN since age 41 -- agreeable to see any GYN.  Also she is having pain in her right wrist. Also pain and stiffness in her right fourth and fifth fingers. States that she has had no known trauma or injury to the wrist or hand. Thinks that it is secondary to the work that she has done over the years. 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 His tried wearing a splint and has also tried wrapping it in an Ace bandage but is unable to work wearing either one of these.   Past Medical History:  Diagnosis Date  . Anxiety   . Chronic back pain   . Chronic pelvic pain in female   . Depression   . Endometriosis   . Fibromyalgia   . Headache(784.0)   . Interstitial cystitis   . Kidney stone   . Ovarian cyst   . Suicide attempt      Home Meds: Outpatient Medications Prior to Visit  Medication Sig Dispense Refill  . ibuprofen (ADVIL,MOTRIN) 200 MG tablet Take 800 mg by mouth every 6 (six) hours as needed (for pain.). pain     . naproxen (NAPROSYN) 500 MG tablet Take 1 tablet (500 mg total) by  mouth 2 (two) times daily. 14 tablet 0  . polyvinyl alcohol (LIQUIFILM TEARS) 1.4 % ophthalmic solution Place 1 drop into both eyes 4 (four) times daily as needed for dry eyes.    . promethazine (PHENERGAN) 25 MG tablet Take 1 tablet (25 mg total) by mouth every 6 (six) hours as needed. 12 tablet 1  . gabapentin (NEURONTIN) 400 MG capsule Take 1 capsule (400 mg total) by mouth 2 (two) times daily. (Patient not taking: Reported on 06/30/2016) 28 capsule 0   No facility-administered medications prior to visit.     Allergies:  Allergies  Allergen Reactions  . Ciprofloxacin Nausea And Vomiting    Social History   Social History  . Marital status: Divorced    Spouse name: N/A  . Number of children: N/A  . Years of education: N/A   Occupational History  . Not on file.   Social History Main Topics  . Smoking status: Current Every Day Smoker    Packs/day: 2.00    Years: 15.00    Types: Cigarettes  . Smokeless tobacco: Never Used  . Alcohol use No  . Drug use:     Types: Marijuana     Comment: daily  . Sexual activity: Yes  Partners: Male    Birth control/ protection: Surgical     Comment: Hysterectomy   Other Topics Concern  . Not on file   Social History Narrative  . No narrative on file    Family History  Problem Relation Age of Onset  . Hypertension Mother   . Hypertension Father   . Muscular dystrophy Brother      Review of Systems:  See HPI for pertinent ROS. All other ROS negative.    Physical Exam: Blood pressure 102/68, pulse (!) 102, temperature 98.9 F (37.2 C), temperature source Oral, resp. rate 18, height 5\' 1"  (1.549 m), weight 91 lb (41.3 kg), SpO2 98 %., Body mass index is 17.19 kg/m. General: Petite WF. Appears in no acute distress. Neck: Supple. No thyromegaly. No lymphadenopathy. Lungs: Clear bilaterally to auscultation without wheezes, rales, or rhonchi. Breathing is unlabored. Heart: RRR with S1 S2. No murmurs, rubs, or gallops. Abdomen:  Mild tenderness with palpation to the left lower abdomen. No other areas of tenderness with palpation. Abdomen is soft with no mass or organomegaly. Musculoskeletal: There is tenderness with palpation of the dorsal and plantar surface of the right wrist. She has discomfort when she tries to completely extend the right fourth and fifth fingers. Extremities/Skin: Warm and dry. Neuro: Alert and oriented X 3. Moves all extremities spontaneously. Gait is normal. CNII-XII grossly in tact. Psych:  Responds to questions appropriately with a normal affect.     ASSESSMENT AND PLAN:  36 y.o. year old female with  1. Cyst of left ovary Will refer to GYN for follow-up. Will give her tramadol to use as needed for pain relief in the interim. - Ambulatory referral to Gynecology - traMADol (ULTRAM) 50 MG tablet; Take 1 -2 every 8 hours as needed for pain  Dispense: 60 tablet; Refill: 0  2. Right wrist pain Will refer to orthopedics. Will give her tramadol to use as needed for pain relief in the interim. Encouraged her to use the splint as much as possible to limit motion until she follows up with orthopedics. - Ambulatory referral to Orthopedic Surgery - traMADol (ULTRAM) 50 MG tablet; Take 1 -2 every 8 hours as needed for pain  Dispense: 60 tablet; Refill: 0   Signed, [address] [place], [place], [facility] 06/30/2016 12:24 PM

## 2016-07-12 ENCOUNTER — Encounter: Payer: Self-pay | Admitting: Obstetrics and Gynecology

## 2016-07-12 ENCOUNTER — Encounter: Payer: Self-pay | Admitting: Orthopedic Surgery

## 2016-07-12 ENCOUNTER — Ambulatory Visit (INDEPENDENT_AMBULATORY_CARE_PROVIDER_SITE_OTHER): Payer: Medicaid Other | Admitting: Orthopedic Surgery

## 2016-07-12 ENCOUNTER — Ambulatory Visit (INDEPENDENT_AMBULATORY_CARE_PROVIDER_SITE_OTHER): Payer: Medicaid Other

## 2016-07-12 ENCOUNTER — Ambulatory Visit (INDEPENDENT_AMBULATORY_CARE_PROVIDER_SITE_OTHER): Payer: Medicaid Other | Admitting: Obstetrics and Gynecology

## 2016-07-12 VITALS — BP 130/80 | HR 83 | Ht 61.0 in | Wt 93.4 lb

## 2016-07-12 VITALS — BP 134/88 | HR 77 | Wt 94.0 lb

## 2016-07-12 DIAGNOSIS — M25531 Pain in right wrist: Secondary | ICD-10-CM

## 2016-07-12 DIAGNOSIS — M24131 Other articular cartilage disorders, right wrist: Secondary | ICD-10-CM

## 2016-07-12 DIAGNOSIS — N83202 Unspecified ovarian cyst, left side: Secondary | ICD-10-CM

## 2016-07-12 DIAGNOSIS — G8929 Other chronic pain: Secondary | ICD-10-CM | POA: Diagnosis not present

## 2016-07-12 DIAGNOSIS — R102 Pelvic and perineal pain: Secondary | ICD-10-CM

## 2016-07-12 DIAGNOSIS — R1032 Left lower quadrant pain: Secondary | ICD-10-CM | POA: Diagnosis not present

## 2016-07-12 MED ORDER — DICLOFENAC POTASSIUM 50 MG PO TABS: 50.0000 mg | ORAL_TABLET | Freq: Two times a day (BID) | ORAL | 3 refills | Status: DC

## 2016-07-12 MED ORDER — GABAPENTIN 400 MG PO CAPS: 400.0000 mg | ORAL_CAPSULE | Freq: Two times a day (BID) | ORAL | 2 refills | Status: DC

## 2016-07-12 MED ORDER — TRAMADOL HCL 50 MG PO TABS: ORAL_TABLET | ORAL | 0 refills | Status: DC

## 2016-07-12 NOTE — Progress Notes (Signed)
Patient ID: CAYDENCE SCHRIEFER, female   DOB: [birth year], 36 y.o.   MRN: [id number]    [facility] Visit  @DATE @            Patient name: Susan Gross MRN [id number]  Date of birth: [birth year]  CC & HPI:   Chief Complaint  Patient presents with  . Ovarian Cyst    left ovarian cyst pain     Susan Gross is a 36 y.o. female presenting today for worsening LLQ abdominal pain that began ~2 weeks ago. Pt had CT 06/22/16 in the ED that revealed left ovarian cyst measuring 2.9 cm. On f/u with PCP, pt received rx for 60 tablets Tramadol. Per pt, she has previously had similar pain and it always presents on the same side. Pt has not been placed on birth control in the past for management of chronic pain in relation to her ovarian cysts. She denies significant h/o RLQ pain. Pt states she is not sexually active because intercourse exacerbates her pain. Pt has h/o hysterectomy at age 60 (~2006) d/t endometriosis. Pt states she never followed up for post-op or with any OBGYN since this procedure. No h/o substance abuse.   Pt also notes she occasionally experiences bloating post-prandial.   ROS:  ROS +LLQ pain   Pertinent History Reviewed:   Reviewed: Significant for hysterectomy, cesarean section  Medical         Past Medical History:  Diagnosis Date  . Anxiety   . Chronic back pain   . Chronic pelvic pain in female   . Depression   . Endometriosis   . Fibromyalgia   . Headache(784.0)   . Interstitial cystitis   . Kidney stone   . Ovarian cyst   . Suicide attempt                               Surgical Hx:    Past Surgical History:  Procedure Laterality Date  . ABDOMINAL HYSTERECTOMY    . CESAREAN SECTION    . HERNIA REPAIR     Medications: Reviewed & Updated - see associated section                       Current Outpatient Prescriptions:  .  gabapentin (NEURONTIN) 400 MG capsule, Take 1 capsule (400 mg total) by mouth 2 (two) times daily., Disp: 28 capsule, Rfl: 0 .   ibuprofen (ADVIL,MOTRIN) 200 MG tablet, Take 800 mg by mouth every 6 (six) hours as needed (for pain.). pain , Disp: , Rfl:  .  naproxen (NAPROSYN) 500 MG tablet, Take 1 tablet (500 mg total) by mouth 2 (two) times daily., Disp: 14 tablet, Rfl: 0 .  polyvinyl alcohol (LIQUIFILM TEARS) 1.4 % ophthalmic solution, Place 1 drop into both eyes 4 (four) times daily as needed for dry eyes., Disp: , Rfl:  .  promethazine (PHENERGAN) 25 MG tablet, Take 1 tablet (25 mg total) by mouth every 6 (six) hours as needed., Disp: 12 tablet, Rfl: 1 .  traMADol (ULTRAM) 50 MG tablet, Take 1 -2 every 8 hours as needed for pain, Disp: 60 tablet, Rfl: 0   Social History: Reviewed -  reports that she has been smoking Cigarettes.  She has a 30.00 pack-year smoking history. She has never used smokeless tobacco.  Objective Findings:  Vitals: Blood pressure 130/80, pulse 83, height 5\' 1"  (1.549 m), weight 93 lb  6.4 oz (42.4 kg).  Physical Examination: General appearance - alert, well appearing, and in no distress Mental status - alert, oriented to person, place, and time Abdomen - soft, nontender, nondistended, no masses or organomegaly Pelvic -  VULVA: normal appearing vulva with no masses, tenderness or lesions,  VAGINA: normal appearing vagina with normal color and discharge, no lesions. Asymmetry in levator muscles, left more prominent than right. Vaginal cuff is irregular, wide, very broad and somewhat immobile.  ADNEXA: normal adnexa in size, and no masses. Vague fullness on the left as compared to the right on bimanual exam. Pain quoted as 5-7/10 in left adnexa.  Cervix, uterus surgically absent.   Discussion: 1. Discussed with pt risks and benefits of surgical treatment of ovarian cyst. Discussed consideration laparoscopic left oophorectomy. Pt states she is amenable to surgery at this time as her pain significantly affects her quality of life.   At end of discussion, pt had opportunity to ask questions and has  no further questions at this time.   Specific discussion of laparoscopy as noted above. Greater than 50% was spent in counseling and coordination of care with the patient.   Total time greater than: 25 minutes.   Assessment & Plan:   A:  1. 2.9 cm left ovarian cyst 2. Vaginal cuff stiffness ? adhesions  P:  1. Consider laparoscopic left oophorectomy  2. Review old records to check for surgical complications  3. F/u in 1 week 4. Refill Tramadol     By signing my name below, I, Hansel Feinstein, attest that this documentation has been prepared under the direction and in the presence of Jonnie Kind, MD. Electronically Signed: Hansel Feinstein, ED Scribe. 07/12/16. 10:49 AM.  I personally performed the services described in this documentation, which was SCRIBED in my presence. The recorded information has been reviewed and considered accurate. It has been edited as necessary during review. Jonnie Kind, MD

## 2016-07-12 NOTE — Progress Notes (Signed)
Patient ID: Susan Gross, female   DOB: 1981/05/15, 36 y.o.   MRN: [id number]  Chief Complaint  Patient presents with  . Wrist Pain    RIGHT WRIST/HAND PAIN AND NUMBNESS    HPI Susan Gross is a 36 y.o. female.  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 Pain seems to be worse with loading of the wrist  Review of Systems Review of Systems Denies chest pain or shortness of breath no rash related  Past Medical History:  Diagnosis Date  . Anxiety   . Chronic back pain   . Chronic pelvic pain in female   . Depression   . Endometriosis   . Fibromyalgia   . Headache(784.0)   . Interstitial cystitis   . Kidney stone   . Ovarian cyst   . Suicide attempt     Past Surgical History:  Procedure Laterality Date  . ABDOMINAL HYSTERECTOMY    . CESAREAN SECTION    . HERNIA REPAIR      Social History Social History  Substance Use Topics  . Smoking status: Current Every Day Smoker    Packs/day: 2.00    Years: 15.00    Types: Cigarettes  . Smokeless tobacco: Never Used  . Alcohol use No    Allergies  Allergen Reactions  . Ciprofloxacin Nausea And Vomiting    Current Meds  Medication Sig  . gabapentin (NEURONTIN) 400 MG capsule Take 1 capsule (400 mg total) by mouth 2 (two) times daily.  . promethazine (PHENERGAN) 25 MG tablet Take 1 tablet (25 mg total) by mouth every 6 (six) hours as needed.  . traMADol (ULTRAM) 50 MG tablet Take 1 -2 every 8 hours as needed for pain      Physical Exam Physical Exam BP 134/88   Pulse 77   Wt 94 lb (42.6 kg)   BMI 17.76 kg/m   Gen. appearance. The patient is well-developed and well-nourished, grooming and hygiene are normal. There are no gross congenital abnormalities  The patient is alert and oriented to person place and  time  Mood and affect are normal  Ambulation Normal   Examination reveals the following: On inspection we find normal left wrist normal no pain tenderness swelling normal range of motion normal grip  Tenderness and prominence of the right ulnar styloid with normal flexion-extension pronation supination with mild discomfort on pronation  Wrist joint stable  Strength tests revealed grade 5 - grip motor strength  Skin we find no rash ulceration or erythema  Sensation remains intact  Impression vascular system shows no peripheral edema  Data Reviewed X-rays:   Assessment    Ulnar styloid pain possible subluxation possible TFCC injury with chronic tear Encounter Diagnoses  Name Primary?  . Right wrist pain Yes  . Degenerative tear of triangular fibrocartilage complex (TFCC) of right wrist    Meds ordered this encounter  Medications  . diclofenac (CATAFLAM) 50 MG tablet    Sig: Take 1 tablet (50 mg total) by mouth 2 (two) times daily.    Dispense:  90 tablet    Refill:  3       Plan    Splint, anti-inflammatories  Follow up in 6 weeks       Arther Abbott 07/12/2016, 2:46 PM

## 2016-07-12 NOTE — Patient Instructions (Signed)
Wear brace for 6 weeks  Continue to take ibuprofen or naproxen for 6 weeks  Follow-up 6 weeks

## 2016-07-23 ENCOUNTER — Encounter: Payer: Self-pay | Admitting: Obstetrics and Gynecology

## 2016-07-23 ENCOUNTER — Ambulatory Visit (INDEPENDENT_AMBULATORY_CARE_PROVIDER_SITE_OTHER): Payer: Medicaid Other | Admitting: Obstetrics and Gynecology

## 2016-07-23 VITALS — BP 132/80 | HR 82 | Ht 61.0 in | Wt 96.0 lb

## 2016-07-23 DIAGNOSIS — Z118 Encounter for screening for other infectious and parasitic diseases: Principal | ICD-10-CM

## 2016-07-23 DIAGNOSIS — R1032 Left lower quadrant pain: Secondary | ICD-10-CM

## 2016-07-23 DIAGNOSIS — N83292 Other ovarian cyst, left side: Secondary | ICD-10-CM | POA: Diagnosis not present

## 2016-07-23 DIAGNOSIS — R110 Nausea: Secondary | ICD-10-CM | POA: Diagnosis not present

## 2016-07-23 DIAGNOSIS — Z1159 Encounter for screening for other viral diseases: Secondary | ICD-10-CM

## 2016-07-23 NOTE — Progress Notes (Signed)
Preoperative History and Physical  Susan Gross is a 36 y.o. [id number] here for surgical management of ovarian cyst.  Pt has been experiencing LLQ abdominal pain for several weeks with associated nausea. She had a CT in December 2017 that showed a left ovarian cyst of ~ 2.9 cm. At this time pt has no significant preoperative concerns.  Proposed surgery: laparoscopic left oophorectomy   Past Medical History:  Diagnosis Date  . Anxiety   . Chronic back pain   . Chronic pelvic pain in female   . Depression   . Endometriosis   . Fibromyalgia   . Headache(784.0)   . Interstitial cystitis   . Kidney stone   . Ovarian cyst   . Suicide attempt    Past Surgical History:  Procedure Laterality Date  . ABDOMINAL HYSTERECTOMY    . CESAREAN SECTION    . HERNIA REPAIR     OB History  Gravida Para Term Preterm AB Living  3 2 2   1 2   SAB TAB Ectopic Multiple Live Births  1            # Outcome Date GA Lbr Len/2nd Weight Sex Delivery Anes PTL Lv  3 SAB           2 Term           1 Term             Patient denies any other pertinent gynecologic issues.   Current Outpatient Prescriptions on File Prior to Visit  Medication Sig Dispense Refill  . diclofenac (CATAFLAM) 50 MG tablet Take 1 tablet (50 mg total) by mouth 2 (two) times daily. 90 tablet 3  . gabapentin (NEURONTIN) 400 MG capsule Take 1 capsule (400 mg total) by mouth 2 (two) times daily. 28 capsule 2  . ibuprofen (ADVIL,MOTRIN) 200 MG tablet Take 800 mg by mouth every 6 (six) hours as needed (for pain.). pain     . naproxen (NAPROSYN) 500 MG tablet Take 1 tablet (500 mg total) by mouth 2 (two) times daily. 14 tablet 0  . polyvinyl alcohol (LIQUIFILM TEARS) 1.4 % ophthalmic solution Place 1 drop into both eyes 4 (four) times daily as needed for dry eyes.    . promethazine (PHENERGAN) 25 MG tablet Take 1 tablet (25 mg total) by mouth every 6 (six) hours as needed. 12 tablet 1  . traMADol (ULTRAM) 50 MG tablet Take 1 -2 every 8  hours as needed for pain 60 tablet 0   No current facility-administered medications on file prior to visit.    Allergies  Allergen Reactions  . Ciprofloxacin Nausea And Vomiting    Social History:   reports that she has been smoking Cigarettes.  She has a 30.00 pack-year smoking history. She has never used smokeless tobacco. She reports that she does not drink alcohol or use drugs.  Family History  Problem Relation Age of Onset  . Hypertension Mother   . Hypertension Father   . Heart attack Father   . Muscular dystrophy Brother   . Cancer Paternal Grandfather   . Other Paternal Grandmother     bleeding ulcers  . Irritable bowel syndrome Paternal Grandmother   . Fibromyalgia Maternal Grandmother   . Cancer Maternal Grandfather   . Bipolar disorder Sister   . Bipolar disorder Sister     Review of Systems: Noncontributory  PHYSICAL EXAM: Blood pressure 132/80, pulse 82, height 5\' 1"  (1.549 m), weight 96 lb (43.5 kg). General appearance -  alert, well appearing, and in no distress Chest - clear to auscultation, no wheezes, rales or rhonchi, symmetric air entry Heart - normal rate and regular rhythm Abdomen - soft, nondistended, no masses or organomegaly. Pt reports pain to the left of umbilicus  Pelvic - external genitalia normal; normal vaginal cuff; bladder limits exam. Pt reports chronic urinary retention Extremities - peripheral pulses normal, no pedal edema, no clubbing or cyanosis  Labs: No results found for this or any previous visit (from the past 336 hour(s)).  Imaging Studies: Dg Wrist Complete Right  Result Date: 07/12/2016 Radiology report x-rays of the    RIGHT  wrist Chief complaint pain in wrist AP and lateral and oblique x-rays of the wrist Findings: The wrist x-ray showed no evidence of abnormality. The alignment is normal. The joint spaces are preserved. There no signs of arthritis or joint space narrowing. No evidence of osteophyte formation. There are no  bone tumors or soft tissue swelling Impression normal wrist x-rays   Assessment: Patient Active Problem List   Diagnosis Date Noted  . Cyst of left ovary 07/12/2016  . Major depressive disorder, recurrent episode, moderate degree ([place]) 01/26/2016  . Major depressive disorder, recurrent episode, moderate ([place]) 01/26/2016  . Interstitial cystitis   . Chronic back pain   . Chronic pelvic pain in female   . ANXIETY 02/21/2007  . DEPRESSION 02/21/2007  . DYSLEXIA 02/21/2007    Discussed pros and cons of right salpingectomy; decided to leave tube in place   Plan: Patient will return in 3 days for further evaluation prior to scheduling left salpingo oophorectomy.    By signing my name below, I, Susan Gross, attest that this documentation has been prepared under the direction and in the presence of Jonnie Kind, MD . Electronically Signed: Evelene Gross, Scribe. 07/23/2016. 12:32 PM. I personally performed the services described in this documentation, which was SCRIBED in my presence. The recorded information has been reviewed and considered accurate. It has been edited as necessary during review. Jonnie Kind, MD

## 2016-07-26 ENCOUNTER — Ambulatory Visit (INDEPENDENT_AMBULATORY_CARE_PROVIDER_SITE_OTHER): Payer: Medicaid Other | Admitting: Obstetrics and Gynecology

## 2016-07-26 ENCOUNTER — Encounter: Payer: Self-pay | Admitting: Obstetrics and Gynecology

## 2016-07-26 VITALS — BP 130/84 | HR 106 | Wt 98.2 lb

## 2016-07-26 DIAGNOSIS — N83292 Other ovarian cyst, left side: Secondary | ICD-10-CM

## 2016-07-26 DIAGNOSIS — R102 Pelvic and perineal pain: Secondary | ICD-10-CM | POA: Diagnosis not present

## 2016-07-26 DIAGNOSIS — R1032 Left lower quadrant pain: Secondary | ICD-10-CM

## 2016-07-26 LAB — GC/CHLAMYDIA PROBE AMP
Chlamydia trachomatis, NAA: NEGATIVE
NEISSERIA GONORRHOEAE BY PCR: NEGATIVE

## 2016-07-26 NOTE — Progress Notes (Signed)
[facility] Visit  07/26/16           Patient name: Susan Gross MRN [id number]  Date of birth: [birth year]  CC & HPI:  Susan Gross is a 35 y.o. female presenting today for follow up discussion. She was last seen on 07/23/16 for a pre-op visit for laparoscopic left oophorectomy. By end of visit she decided she might not have the surgery so she was scheduled to return today for further discussion. She states she is currently in pain on the left side.   ROS:  ROS +left sided suprapubic pain  Pertinent History Reviewed:   Reviewed: Significant for ovarian cyst, endometriosis, abdominal hysterectomy   Medical         Past Medical History:  Diagnosis Date   Anxiety    Chronic back pain    Chronic pelvic pain in female    Depression    Endometriosis    Fibromyalgia    Headache(784.0)    Interstitial cystitis    Kidney stone    Ovarian cyst    Suicide attempt                               Surgical Hx:    Past Surgical History:  Procedure Laterality Date   ABDOMINAL HYSTERECTOMY     CESAREAN SECTION     HERNIA REPAIR     Medications: Reviewed & Updated - see associated section                       Current Outpatient Prescriptions:    diclofenac (CATAFLAM) 50 MG tablet, Take 1 tablet (50 mg total) by mouth 2 (two) times daily., Disp: 90 tablet, Rfl: 3   gabapentin (NEURONTIN) 400 MG capsule, Take 1 capsule (400 mg total) by mouth 2 (two) times daily., Disp: 28 capsule, Rfl: 2   ibuprofen (ADVIL,MOTRIN) 200 MG tablet, Take 800 mg by mouth every 6 (six) hours as needed (for pain.). pain , Disp: , Rfl:    naproxen (NAPROSYN) 500 MG tablet, Take 1 tablet (500 mg total) by mouth 2 (two) times daily., Disp: 14 tablet, Rfl: 0   polyvinyl alcohol (LIQUIFILM TEARS) 1.4 % ophthalmic solution, Place 1 drop into both eyes 4 (four) times daily as needed for dry eyes., Disp: , Rfl:    promethazine (PHENERGAN) 25 MG tablet, Take 1 tablet (25 mg total) by  mouth every 6 (six) hours as needed., Disp: 12 tablet, Rfl: 1   traMADol (ULTRAM) 50 MG tablet, Take 1 -2 every 8 hours as needed for pain, Disp: 60 tablet, Rfl: 0   Social History: Reviewed -  reports that she has been smoking Cigarettes.  She has a 30.00 pack-year smoking history. She has never used smokeless tobacco.  Objective Findings:  Vitals: Blood pressure 130/84, pulse (!) 106, weight 98 lb 3.2 oz (44.5 kg).  Physical Examination: discussion only, not indicated    Assessment & Plan:   A:  1. Chronic LLQ pain 2. Entrapped left ovary   P:  1. Move forward with laparoscopic left oophorectomy [facility] for scheduling and back here for preop exam   By signing my name below, I, Sonum Patel, attest that this documentation has been prepared under the direction and in the presence of Jonnie Kind, MD. Electronically Signed: Sonum Patel, Education administrator. 07/26/16. 9:34 AM.  I personally performed the services described in this documentation,  which was SCRIBED in my presence. The recorded information has been reviewed and considered accurate. It has been edited as necessary during review. Jonnie Kind, MD

## 2016-08-03 NOTE — Patient Instructions (Signed)
Susan Gross  08/03/2016     @PREFPERIOPPHARMACY @   Your procedure is scheduled on  08/10/2016   Report to [facility] at  1000  A.M.  Call this number if you have problems the morning of surgery:  [phone]   Remember:  Do not eat food or drink liquids after midnight.  Take these medicines the morning of surgery with A SIP OF WATER  Neurontin, phenergan, ultram.   Do not wear jewelry, make-up or nail polish.  Do not wear lotions, powders, or perfumes, or deoderant.  Do not shave 48 hours prior to surgery.  Men may shave face and neck.  Do not bring valuables to the hospital.  [facility] is not responsible for any belongings or valuables.  Contacts, dentures or bridgework may not be worn into surgery.  Leave your suitcase in the car.  After surgery it may be brought to your room.  For patients admitted to the hospital, discharge time will be determined by your treatment team.  Patients discharged the day of surgery will not be allowed to drive home.   Name and phone number of your driver:   family Special instructions:  None  Please read over the following fact sheets that you were given. Anesthesia Post-op Instructions and Care and Recovery After Surgery       Unilateral Salpingo-Oophorectomy Unilateral salpingo-oophorectomy is the surgical removal of one fallopian tube and ovary. The ovaries are small organs that produce eggs in women. The fallopian tubes transport the egg from the ovary to the womb (uterus). A unilateral salpingo-oophorectomy may be done for various reasons, including:  Infection in the fallopian tube and ovary.  Scar tissue in the fallopian tube and ovary (adhesions).  A cyst or tumor on the ovary.  A need to remove the fallopian tube and ovary when removing the uterus.  Cancer of the fallopian tube or ovary. The removal of one fallopian tube and ovary will not prevent you from becoming pregnant, put you into menopause, or cause  problems with your menstrual periods or sex drive. LET [facility] CARE PROVIDER KNOW ABOUT:  Any allergies you have.  All medicines you are taking, including vitamins, herbs, eye drops, creams, and over-the-counter medicines.  Previous problems you or members of your family have had with the use of anesthetics.  Any blood disorders you have.  Previous surgeries you have had.  Medical conditions you have. RISKS AND COMPLICATIONS  Generally, this is a safe procedure. However, as with any procedure, complications can occur. Possible complications include:  Injury to surrounding organs.  Bleeding.  Infection.  Blood clots in the legs or lungs.  Problems related to anesthesia. BEFORE THE PROCEDURE  Ask your health care provider about changing or stopping your regular medicines. You may need to stop taking certain medicines, such as aspirin or blood thinners, at least 1 week before the surgery.  Do not eat or drink anything for at least 8 hours before the surgery.  If you smoke, do not smoke for at least 2 weeks before the surgery.  Make plans to have someone drive you home after the procedure or after your hospital stay. Also arrange for someone to help you with activities during recovery. PROCEDURE  You will be given medicine to help you relax before the procedure (sedative). You will then be given medicine to make you sleep through the procedure (general anesthetic). These medicines will be given through an IV access  tube that is put into one of your veins.  Once you are asleep, your lower abdomen will be shaved and cleaned. A thin, flexible tube (catheter) will be placed in your bladder.  The surgeon may use a laparoscopic, robotic, or open technique for this surgery:  In the laparoscopic technique, the surgery is done through two small cuts (incisions) in the abdomen. A thin, lighted tube with a tiny camera on the end (laparoscope) is inserted into one of the incisions. The  tools needed for the procedure are put through the other incision.  A robotic technique may be chosen to perform complex surgery in a small space. In the robotic technique, small incisions are made. A camera and surgical instruments are passed through the incisions. Surgical instruments are controlled with the help of a robotic arm.  In the open technique, the surgery is done through one large incision in the abdomen.  Using any of these techniques, the surgeon will remove the fallopian tube and ovary. The blood vessels will be clamped and tied.  The surgeon will then use staples or stitches to close the incision or incisions. AFTER THE PROCEDURE  You will be taken to a recovery area where your progress will be monitored for 1-3 hours. Your blood pressure, pulse, and temperature will be checked often. You will remain in the recovery area until you are stable and waking up.  If the laparoscopic technique was used, you may be allowed to go home after several hours. You may have some shoulder pain. This is normal and usually goes away in a day or two.  If the open technique was used, you will be admitted to the hospital for a couple of days.  You will be given pain medicine as necessary.  The IV tube and catheter will be removed before you are discharged. This information is not intended to replace advice given to you by your health care provider. Make sure you discuss any questions you have with your health care provider. Document Released: 04/11/2009 Document Revised: 06/19/2013 Document Reviewed: 12/06/2012 Elsevier Interactive Patient Education  2017 [employer] Unilateral Salpingo-Oophorectomy, Care After Refer to this sheet in the next few weeks. These instructions provide you with information on caring for yourself after your procedure. Your health care provider may also give you more specific instructions. Your treatment has been planned according to current medical practices, but  problems sometimes occur. Call your health care provider if you have any problems or questions after your procedure. WHAT TO EXPECT AFTER THE PROCEDURE After your procedure, it is typical to have the following:  Abdominal pain that can be controlled with pain medicine.  Vaginal spotting.  Constipation. HOME CARE INSTRUCTIONS   Get plenty of rest and sleep.  Only take over-the-counter or prescription medicines as directed by your health care provider. Do not take aspirin. It can cause bleeding.  Keep incision areas clean and dry. Remove or change any bandages (dressings) only as directed by your health care provider.  Follow your health care provider's advice regarding diet.  Drink enough fluids to keep your urine clear or pale yellow.  Limit exercise and activities as directed by your health care provider. Do not lift anything heavier than 5 pounds (2.3 kg) until your health care provider approves.  Do not drive until your health care provider approves.  Do not drink alcohol until your health care provider approves.  Do not have sexual intercourse until your health care provider says it is OK.  Take  your temperature twice a day and write it down.  If you become constipated, you may:  Ask your health care provider about taking a mild laxative.  Add more fruit and bran to your diet.  Drink more fluids.  Follow up with your health care provider as directed. SEEK MEDICAL CARE IF:   You have swelling or redness in the incision area.  You develop a rash.  You feel lightheaded.  You have pain that is not controlled with medicine.  You have pain, swelling, or redness where the IV access tube was placed. SEEK IMMEDIATE MEDICAL CARE IF:  You have a fever.  You develop increasing abdominal pain.  You see pus coming out of the incision, or the incision is separating.  You notice a bad smell coming from the wound or dressing.  You have excessive vaginal  bleeding.  You feel sick to your stomach (nauseous) and vomit.  You have leg or chest pain.  You have pain when you urinate.  You develop shortness of breath.  You pass out. This information is not intended to replace advice given to you by your health care provider. Make sure you discuss any questions you have with your health care provider. Document Released: 04/10/2009 Document Revised: 04/04/2013 Document Reviewed: 12/06/2012 Elsevier Interactive Patient Education  2017 [employer] PATIENT INSTRUCTIONS POST-ANESTHESIA  IMMEDIATELY FOLLOWING SURGERY:  Do not drive or operate machinery for the first twenty four hours after surgery.  Do not make any important decisions for twenty four hours after surgery or while taking narcotic pain medications or sedatives.  If you develop intractable nausea and vomiting or a severe headache please notify your doctor immediately.  FOLLOW-UP:  Please make an appointment with your surgeon as instructed. You do not need to follow up with anesthesia unless specifically instructed to do so.  WOUND CARE INSTRUCTIONS (if applicable):  Keep a dry clean dressing on the anesthesia/puncture wound site if there is drainage.  Once the wound has quit draining you may leave it open to air.  Generally you should leave the bandage intact for twenty four hours unless there is drainage.  If the epidural site drains for more than 36-48 hours please call the anesthesia department.  QUESTIONS?:  Please feel free to call your physician or the hospital operator if you have any questions, and they will be happy to assist you.

## 2016-08-05 ENCOUNTER — Encounter (HOSPITAL_COMMUNITY)
Admission: RE | Admit: 2016-08-05 | Discharge: 2016-08-05 | Disposition: A | Discharge status: Home / Self Care | Payer: Medicaid Other | Source: Ambulatory Visit | Attending: Obstetrics and Gynecology | Admitting: Obstetrics and Gynecology

## 2016-08-05 ENCOUNTER — Encounter (HOSPITAL_COMMUNITY): Payer: Self-pay

## 2016-08-05 DIAGNOSIS — N83292 Other ovarian cyst, left side: Secondary | ICD-10-CM | POA: Insufficient documentation

## 2016-08-05 DIAGNOSIS — Z01812 Encounter for preprocedural laboratory examination: Secondary | ICD-10-CM | POA: Insufficient documentation

## 2016-08-05 HISTORY — DX: Anemia, unspecified: D64.9

## 2016-08-05 HISTORY — DX: Malignant (primary) neoplasm, unspecified: C80.1

## 2016-08-05 HISTORY — DX: Personal history of urinary calculi: Z87.442

## 2016-08-05 LAB — CBC WITH DIFFERENTIAL/PLATELET
Basophils Absolute: 0 10*3/uL (ref 0.0–0.1)
Basophils Relative: 0 %
EOS PCT: 0 %
Eosinophils Absolute: 0 10*3/uL (ref 0.0–0.7)
HCT: 42.2 % (ref 36.0–46.0)
Hemoglobin: 14.8 g/dL (ref 12.0–15.0)
LYMPHS ABS: 1.3 10*3/uL (ref 0.7–4.0)
Lymphocytes Relative: 25 %
MCH: 31.6 pg (ref 26.0–34.0)
MCHC: 35.1 g/dL (ref 30.0–36.0)
MCV: 90 fL (ref 78.0–100.0)
MONO ABS: 0.5 10*3/uL (ref 0.1–1.0)
MONOS PCT: 9 %
Neutro Abs: 3.2 10*3/uL (ref 1.7–7.7)
Neutrophils Relative %: 66 %
PLATELETS: 195 10*3/uL (ref 150–400)
RBC: 4.69 MIL/uL (ref 3.87–5.11)
RDW: 12.8 % (ref 11.5–15.5)
WBC: 5 10*3/uL (ref 4.0–10.5)

## 2016-08-05 LAB — URINALYSIS, ROUTINE W REFLEX MICROSCOPIC
Bilirubin Urine: NEGATIVE
Glucose, UA: NEGATIVE mg/dL
Hgb urine dipstick: NEGATIVE
Ketones, ur: NEGATIVE mg/dL
LEUKOCYTES UA: NEGATIVE
NITRITE: NEGATIVE
PH: 5 (ref 5.0–8.0)
Protein, ur: NEGATIVE mg/dL
SPECIFIC GRAVITY, URINE: 1.016 (ref 1.005–1.030)

## 2016-08-05 LAB — COMPREHENSIVE METABOLIC PANEL
ALT: 18 U/L (ref 14–54)
ANION GAP: 7 (ref 5–15)
AST: 19 U/L (ref 15–41)
Albumin: 4.2 g/dL (ref 3.5–5.0)
Alkaline Phosphatase: 44 U/L (ref 38–126)
BUN: 14 mg/dL (ref 6–20)
CHLORIDE: 103 mmol/L (ref 101–111)
CO2: 25 mmol/L (ref 22–32)
Calcium: 8.9 mg/dL (ref 8.9–10.3)
Creatinine, Ser: 0.66 mg/dL (ref 0.44–1.00)
GFR calc non Af Amer: >60 mL/min (ref >60)
Glucose, Bld: 105 mg/dL — ABNORMAL HIGH (ref 65–99)
POTASSIUM: 3.7 mmol/L (ref 3.5–5.1)
Sodium: 135 mmol/L (ref 135–145)
Total Bilirubin: 0.5 mg/dL (ref 0.3–1.2)
Total Protein: 7.1 g/dL (ref 6.5–8.1)

## 2016-08-10 ENCOUNTER — Encounter (HOSPITAL_COMMUNITY): Payer: Self-pay | Admitting: Anesthesiology

## 2016-08-10 ENCOUNTER — Ambulatory Visit (HOSPITAL_COMMUNITY): Payer: Medicaid Other | Admitting: Anesthesiology

## 2016-08-10 ENCOUNTER — Ambulatory Visit (HOSPITAL_COMMUNITY)
Admission: RE | Admit: 2016-08-10 | Discharge: 2016-08-10 | Disposition: A | Discharge status: Home / Self Care | Payer: Medicaid Other | Source: Ambulatory Visit | Attending: Obstetrics and Gynecology | Admitting: Obstetrics and Gynecology

## 2016-08-10 ENCOUNTER — Encounter (HOSPITAL_COMMUNITY)
Admission: RE | Disposition: A | Discharge status: Home / Self Care | Payer: Self-pay | Source: Ambulatory Visit | Attending: Obstetrics and Gynecology

## 2016-08-10 DIAGNOSIS — N994 Postprocedural pelvic peritoneal adhesions: Secondary | ICD-10-CM

## 2016-08-10 DIAGNOSIS — N736 Female pelvic peritoneal adhesions (postinfective): Secondary | ICD-10-CM | POA: Diagnosis not present

## 2016-08-10 DIAGNOSIS — Z87442 Personal history of urinary calculi: Secondary | ICD-10-CM | POA: Diagnosis not present

## 2016-08-10 DIAGNOSIS — F17210 Nicotine dependence, cigarettes, uncomplicated: Secondary | ICD-10-CM | POA: Insufficient documentation

## 2016-08-10 DIAGNOSIS — Z881 Allergy status to other antibiotic agents status: Secondary | ICD-10-CM | POA: Diagnosis not present

## 2016-08-10 DIAGNOSIS — N8302 Follicular cyst of left ovary: Secondary | ICD-10-CM | POA: Insufficient documentation

## 2016-08-10 DIAGNOSIS — F419 Anxiety disorder, unspecified: Secondary | ICD-10-CM | POA: Insufficient documentation

## 2016-08-10 DIAGNOSIS — M797 Fibromyalgia: Secondary | ICD-10-CM | POA: Diagnosis not present

## 2016-08-10 DIAGNOSIS — R102 Pelvic and perineal pain: Secondary | ICD-10-CM | POA: Diagnosis not present

## 2016-08-10 DIAGNOSIS — G8929 Other chronic pain: Secondary | ICD-10-CM | POA: Insufficient documentation

## 2016-08-10 DIAGNOSIS — F329 Major depressive disorder, single episode, unspecified: Secondary | ICD-10-CM | POA: Insufficient documentation

## 2016-08-10 DIAGNOSIS — N83292 Other ovarian cyst, left side: Secondary | ICD-10-CM

## 2016-08-10 HISTORY — PX: LAPAROSCOPIC SALPINGO OOPHERECTOMY: SHX5927

## 2016-08-10 SURGERY — SALPINGO-OOPHORECTOMY, LAPAROSCOPIC
Anesthesia: General | Site: Abdomen | Laterality: Left

## 2016-08-10 MED ORDER — ONDANSETRON HCL 4 MG/2ML IJ SOLN
INTRAMUSCULAR | Status: AC
  Filled 2016-08-10: qty 2

## 2016-08-10 MED ORDER — ROCURONIUM 10MG/ML (10ML) SYRINGE FOR MEDFUSION PUMP - OPTIME
INTRAVENOUS | Status: DC | PRN
  Administered 2016-08-10: 20 mg via INTRAVENOUS

## 2016-08-10 MED ORDER — LIDOCAINE HCL (CARDIAC) 10 MG/ML IV SOLN
INTRAVENOUS | Status: DC | PRN
  Administered 2016-08-10: 50 mg via INTRAVENOUS

## 2016-08-10 MED ORDER — MIDAZOLAM HCL 2 MG/2ML IJ SOLN
INTRAMUSCULAR | Status: AC
  Filled 2016-08-10: qty 2

## 2016-08-10 MED ORDER — SCOPOLAMINE 1 MG/3DAYS TD PT72
MEDICATED_PATCH | TRANSDERMAL | Status: AC
  Filled 2016-08-10: qty 1

## 2016-08-10 MED ORDER — OXYCODONE-ACETAMINOPHEN 5-325 MG PO TABS: 1.0000 | ORAL_TABLET | ORAL | 0 refills | Status: DC | PRN

## 2016-08-10 MED ORDER — MIDAZOLAM HCL 5 MG/5ML IJ SOLN
INTRAMUSCULAR | Status: DC | PRN
Start: 2016-08-10 — End: 2016-08-10
  Administered 2016-08-10: 2 mg via INTRAVENOUS

## 2016-08-10 MED ORDER — BUPIVACAINE HCL (PF) 0.5 % IJ SOLN
INTRAMUSCULAR | Status: DC | PRN
  Administered 2016-08-10: 10 mL

## 2016-08-10 MED ORDER — MIDAZOLAM HCL 2 MG/2ML IJ SOLN
1.0000 mg | INTRAMUSCULAR | Status: AC
Start: 2016-08-10 — End: 2016-08-10
  Administered 2016-08-10: 2 mg via INTRAVENOUS

## 2016-08-10 MED ORDER — KETOROLAC TROMETHAMINE 10 MG PO TABS: 10.0000 mg | ORAL_TABLET | Freq: Four times a day (QID) | ORAL | 0 refills | Status: DC | PRN

## 2016-08-10 MED ORDER — HYDROMORPHONE HCL 1 MG/ML IJ SOLN
0.2500 mg | INTRAMUSCULAR | Status: DC | PRN
  Administered 2016-08-10 (×4): 0.5 mg via INTRAVENOUS
  Filled 2016-08-10 (×4): qty 0.5

## 2016-08-10 MED ORDER — LIDOCAINE HCL (PF) 1 % IJ SOLN
INTRAMUSCULAR | Status: AC
  Filled 2016-08-10: qty 5

## 2016-08-10 MED ORDER — LACTATED RINGERS IV SOLN
INTRAVENOUS | Status: DC
  Administered 2016-08-10: 1000 mL via INTRAVENOUS
  Administered 2016-08-10: 13:00:00 via INTRAVENOUS

## 2016-08-10 MED ORDER — NEOSTIGMINE METHYLSULFATE 10 MG/10ML IV SOLN
INTRAVENOUS | Status: DC | PRN
  Administered 2016-08-10: 2 mg via INTRAVENOUS

## 2016-08-10 MED ORDER — BUPIVACAINE HCL (PF) 0.5 % IJ SOLN
INTRAMUSCULAR | Status: AC
  Filled 2016-08-10: qty 30

## 2016-08-10 MED ORDER — FENTANYL CITRATE (PF) 100 MCG/2ML IJ SOLN
INTRAMUSCULAR | Status: DC | PRN
  Administered 2016-08-10 (×5): 50 ug via INTRAVENOUS

## 2016-08-10 MED ORDER — CEFAZOLIN SODIUM-DEXTROSE 2-4 GM/100ML-% IV SOLN
2.0000 g | Freq: Once | INTRAVENOUS | Status: AC
  Administered 2016-08-10: 2 g via INTRAVENOUS

## 2016-08-10 MED ORDER — ONDANSETRON HCL 4 MG/2ML IJ SOLN
4.0000 mg | Freq: Once | INTRAMUSCULAR | Status: AC
  Administered 2016-08-10: 4 mg via INTRAVENOUS

## 2016-08-10 MED ORDER — GLYCOPYRROLATE 0.2 MG/ML IJ SOLN
INTRAMUSCULAR | Status: AC
  Filled 2016-08-10: qty 2

## 2016-08-10 MED ORDER — CEFAZOLIN SODIUM-DEXTROSE 2-4 GM/100ML-% IV SOLN
INTRAVENOUS | Status: AC
  Filled 2016-08-10: qty 100

## 2016-08-10 MED ORDER — KETOROLAC TROMETHAMINE 30 MG/ML IJ SOLN
INTRAMUSCULAR | Status: AC
  Filled 2016-08-10: qty 1

## 2016-08-10 MED ORDER — SCOPOLAMINE 1 MG/3DAYS TD PT72
1.0000 | MEDICATED_PATCH | Freq: Once | TRANSDERMAL | Status: DC
  Administered 2016-08-10: 1.5 mg via TRANSDERMAL

## 2016-08-10 MED ORDER — PROPOFOL 10 MG/ML IV BOLUS
INTRAVENOUS | Status: DC | PRN
  Administered 2016-08-10: 80 mg via INTRAVENOUS

## 2016-08-10 MED ORDER — KETOROLAC TROMETHAMINE 30 MG/ML IJ SOLN
30.0000 mg | Freq: Once | INTRAMUSCULAR | Status: AC
  Administered 2016-08-10: 30 mg via INTRAVENOUS

## 2016-08-10 MED ORDER — FENTANYL CITRATE (PF) 250 MCG/5ML IJ SOLN
INTRAMUSCULAR | Status: AC
  Filled 2016-08-10: qty 5

## 2016-08-10 MED ORDER — GLYCOPYRROLATE 0.2 MG/ML IJ SOLN
INTRAMUSCULAR | Status: DC | PRN
  Administered 2016-08-10: 0.4 mg via INTRAVENOUS

## 2016-08-10 MED ORDER — 0.9 % SODIUM CHLORIDE (POUR BTL) OPTIME
TOPICAL | Status: DC | PRN
  Administered 2016-08-10: 1000 mL

## 2016-08-10 SURGICAL SUPPLY — 46 items
BAG HAMPER (MISCELLANEOUS) ×3 IMPLANT
BANDAGE STRIP 1X3 FLEXIBLE (GAUZE/BANDAGES/DRESSINGS) ×9 IMPLANT
BANDAID FLEXIBLE 1X3 (GAUZE/BANDAGES/DRESSINGS) ×9 IMPLANT
BLADE SURG SZ11 CARB STEEL (BLADE) ×3 IMPLANT
CLOSURE STERI-STRIP 1/4X4 (GAUZE/BANDAGES/DRESSINGS) ×3 IMPLANT
CLOSURE WOUND 1/4 X3 (GAUZE/BANDAGES/DRESSINGS)
CLOTH BEACON ORANGE TIMEOUT ST (SAFETY) ×3 IMPLANT
COVER LIGHT HANDLE STERIS (MISCELLANEOUS) ×6 IMPLANT
DURAPREP 26ML APPLICATOR (WOUND CARE) ×3 IMPLANT
ELECT REM PT RETURN 9FT ADLT (ELECTROSURGICAL) ×3
ELECTRODE REM PT RTRN 9FT ADLT (ELECTROSURGICAL) ×1 IMPLANT
FILTER SMOKE EVAC LAPAROSHD (FILTER) ×3 IMPLANT
GLOVE BIOGEL PI IND STRL 7.0 (GLOVE) ×1 IMPLANT
GLOVE BIOGEL PI IND STRL 9 (GLOVE) ×1 IMPLANT
GLOVE BIOGEL PI INDICATOR 7.0 (GLOVE) ×2
GLOVE BIOGEL PI INDICATOR 9 (GLOVE) ×2
GLOVE ECLIPSE 9.0 STRL (GLOVE) ×3 IMPLANT
GOWN SPEC L3 XXLG W/TWL (GOWN DISPOSABLE) ×6 IMPLANT
GOWN STRL REUS W/TWL LRG LVL3 (GOWN DISPOSABLE) ×3 IMPLANT
INST SET LAPROSCOPIC GYN AP (KITS) ×3 IMPLANT
IV STOPCOCK 4 WAY 40  W/Y SET (IV SOLUTION) ×2
IV STOPCOCK 4 WAY 40 W/Y SET (IV SOLUTION) ×1 IMPLANT
KIT ROOM TURNOVER AP CYSTO (KITS) ×3 IMPLANT
MANIFOLD NEPTUNE II (INSTRUMENTS) ×3 IMPLANT
NEEDLE HYPO 18GX1.5 BLUNT FILL (NEEDLE) IMPLANT
NEEDLE HYPO 25X1 1.5 SAFETY (NEEDLE) ×3 IMPLANT
NEEDLE INSUFFLATION 14GA 120MM (NEEDLE) ×3 IMPLANT
PACK PERI GYN (CUSTOM PROCEDURE TRAY) ×3 IMPLANT
PAD ARMBOARD 7.5X6 YLW CONV (MISCELLANEOUS) ×3 IMPLANT
POUCH SPECIMEN RETRIEVAL 10MM (ENDOMECHANICALS) ×3 IMPLANT
SET BASIN LINEN APH (SET/KITS/TRAYS/PACK) ×3 IMPLANT
SET TUBE IRRIG SUCTION NO TIP (IRRIGATION / IRRIGATOR) IMPLANT
SHEARS HARMONIC ACE PLUS 36CM (ENDOMECHANICALS) ×3 IMPLANT
SLEEVE ENDOPATH XCEL 5M (ENDOMECHANICALS) IMPLANT
SOLUTION ANTI FOG 6CC (MISCELLANEOUS) ×3 IMPLANT
STRIP CLOSURE SKIN 1/4X3 (GAUZE/BANDAGES/DRESSINGS) IMPLANT
SUT VIC AB 4-0 PS2 27 (SUTURE) ×3 IMPLANT
SUT VICRYL 0 UR6 27IN ABS (SUTURE) ×3 IMPLANT
SYR BULB IRRIGATION 50ML (SYRINGE) ×3 IMPLANT
SYRINGE 10CC LL (SYRINGE) ×3 IMPLANT
TRAY FOLEY CATH SILVER 16FR (SET/KITS/TRAYS/PACK) ×3 IMPLANT
TROCAR ENDO BLADELESS 11MM (ENDOMECHANICALS) ×3 IMPLANT
TROCAR ENDO BLADELESS 12MM (ENDOMECHANICALS) ×3 IMPLANT
TROCAR XCEL NON-BLD 5MMX100MML (ENDOMECHANICALS) ×3 IMPLANT
TUBING INSUFFLATION (TUBING) ×3 IMPLANT
WARMER LAPAROSCOPE (MISCELLANEOUS) ×3 IMPLANT

## 2016-08-10 NOTE — H&P (Signed)
Jonnie Kind, MD  Obstetrics  Expand All Collapse All   [] Hide copied text Preoperative History and Physical  Susan Gross is a 36 y.o. [phone] here for surgical management of ovarian cyst.  Pt has been experiencing LLQ abdominal pain for several weeks with associated nausea. She had a CT in December 2017 that showed a left ovarian cyst of ~ 2.9 cm. At this time pt has no significant preoperative concerns.She states she is currently in pain on the left side  Proposed surgery: laparoscopic left oophorectomy       Past Medical History:  Diagnosis Date  . Anxiety   . Chronic back pain   . Chronic pelvic pain in female   . Depression   . Endometriosis   . Fibromyalgia   . Headache(784.0)   . Interstitial cystitis   . Kidney stone   . Ovarian cyst   . Suicide attempt         Past Surgical History:  Procedure Laterality Date  . ABDOMINAL HYSTERECTOMY    . CESAREAN SECTION    . HERNIA REPAIR                     OB History  Gravida Para Term Preterm AB Living  3 2 2   1 2   SAB TAB Ectopic Multiple Live Births  1            # Outcome Date GA Lbr Len/2nd Weight Sex Delivery Anes PTL Lv  3 SAB           2 Term           1 Term             Patient denies any other pertinent gynecologic issues.         Current Outpatient Prescriptions on File Prior to Visit  Medication Sig Dispense Refill  . diclofenac (CATAFLAM) 50 MG tablet Take 1 tablet (50 mg total) by mouth 2 (two) times daily. 90 tablet 3  . gabapentin (NEURONTIN) 400 MG capsule Take 1 capsule (400 mg total) by mouth 2 (two) times daily. 28 capsule 2  . ibuprofen (ADVIL,MOTRIN) 200 MG tablet Take 800 mg by mouth every 6 (six) hours as needed (for pain.). pain     . naproxen (NAPROSYN) 500 MG tablet Take 1 tablet (500 mg total) by mouth 2 (two) times daily. 14 tablet 0  . polyvinyl alcohol (LIQUIFILM TEARS) 1.4 % ophthalmic solution Place 1 drop into both eyes  4 (four) times daily as needed for dry eyes.    . promethazine (PHENERGAN) 25 MG tablet Take 1 tablet (25 mg total) by mouth every 6 (six) hours as needed. 12 tablet 1  . traMADol (ULTRAM) 50 MG tablet Take 1 -2 every 8 hours as needed for pain 60 tablet 0   No current facility-administered medications on file prior to visit.        Allergies  Allergen Reactions  . Ciprofloxacin Nausea And Vomiting    Social History:   reports that she has been smoking Cigarettes.  She has a 30.00 pack-year smoking history. She has never used smokeless tobacco. She reports that she does not drink alcohol or use drugs.        Family History  Problem Relation Age of Onset  . Hypertension Mother   . Hypertension Father   . Heart attack Father   . Muscular dystrophy Brother   . Cancer Paternal Grandfather   . Other Paternal  Grandmother     bleeding ulcers  . Irritable bowel syndrome Paternal Grandmother   . Fibromyalgia Maternal Grandmother   . Cancer Maternal Grandfather   . Bipolar disorder Sister   . Bipolar disorder Sister     Review of Systems: Noncontributory  PHYSICAL EXAM:   Blood pressure 132/80, pulse 82, height 5\' 1"  (1.549 m), weight 96 lb (43.5 kg). General appearance - alert, well appearing, and in no distress Chest - clear to auscultation, no wheezes, rales or rhonchi, symmetric air entry Heart - normal rate and regular rhythm Abdomen - soft, nondistended, no masses or organomegaly. Pt reports pain to the left of umbilicus  Pelvic - external genitalia normal; normal vaginal cuff; bladder limits exam. Pt reports chronic urinary retention. Tenderness to palpation of cuff , on left, felt due to left ovary, suspected to have adhesions to surgical cuff. Extremities - peripheral pulses normal, no pedal edema, no clubbing or cyanosis  Labs: CBC    Component Value Date/Time   WBC 5.0 08/05/2016 1008   RBC 4.69 08/05/2016 1008   HGB 14.8 08/05/2016 1008    HCT 42.2 08/05/2016 1008   PLT 195 08/05/2016 1008   MCV 90.0 08/05/2016 1008   MCH 31.6 08/05/2016 1008   MCHC 35.1 08/05/2016 1008   RDW 12.8 08/05/2016 1008   LYMPHSABS 1.3 08/05/2016 1008   MONOABS 0.5 08/05/2016 1008   EOSABS 0.0 08/05/2016 1008   BASOSABS 0.0 08/05/2016 1008   BMET    Component Value Date/Time   NA 135 08/05/2016 1008   K 3.7 08/05/2016 1008   CL 103 08/05/2016 1008   CO2 25 08/05/2016 1008   GLUCOSE 105 (H) 08/05/2016 1008   BUN 14 08/05/2016 1008   CREATININE 0.66 08/05/2016 1008   CALCIUM 8.9 08/05/2016 1008   GFRNONAA >60 08/05/2016 1008   GFRAA >60 08/05/2016 1008     Imaging Studies: from CT 12/17:  Stomach/Bowel: Stomach is physiologically distended. No evidence of bowel inflammation or distention. Evaluation limited by lack of contrast and paucity of intra-abdominal fat. Cecum low lying in the pelvis, the appendix is tentatively but not confidently identified. No pericecal inflammation.  Vascular/Lymphatic: No significant vascular findings are present. No enlarged abdominal or pelvic lymph nodes.  Reproductive: Post hysterectomy. There is a 2.9 cm cyst in the left ovary. Right ovary not confidently visualized.  Other: No abdominal wall hernia or abnormality. No abdominopelvic ascites.  Musculoskeletal: There are no acute or suspicious osseous abnormalities.  IMPRESSION: 1. Bilateral nonobstructing renal calculi. No hydronephrosis or obstructive uropathy. 2. Left ovarian cyst measures 2.9 cm.   Electronically Signed   By: Jeb Levering M.D.   On: 06/22/2016 23:19   Assessment: Chronic LLQ pain s/p abdominal hysterectomy.            Left ovarian cyst. Suspected pelvic adhesions.        Patient Active Problem List   Diagnosis Date Noted  . Cyst of left ovary 07/12/2016  . Major depressive disorder, recurrent episode, moderate degree ([place]) 01/26/2016  . Major depressive disorder, recurrent episode, moderate  ([place]) 01/26/2016  . Interstitial cystitis   . Chronic back pain   . Chronic pelvic pain in female   . ANXIETY 02/21/2007  . DEPRESSION 02/21/2007  . DYSLEXIA 02/21/2007        Plan Laparoscopic Left Salpingoophorectomy.     Discussed the option of removing the right tube at the same time for future cancer risk reduction, but after reviewing risks, specifically of increased  risk of post surgical adhesions, the patient decides that prophylactic Right salpingectomy is not planned. Risk of surgical procedure, including the possibility of laparotomy, or injury to adjacent organs requiring further surgical care reviewed with the patient.

## 2016-08-10 NOTE — Transfer of Care (Signed)
Immediate Anesthesia Transfer of Care Note  Patient: Susan Gross  Procedure(s) Performed: Procedure(s) with comments: LAPAROSCOPIC SALPINGO OOPHORECTOMY (Left) - possible open procedure  Patient Location: PACU  Anesthesia Type:General  Level of Consciousness: awake and patient cooperative  Airway & Oxygen Therapy: Patient Spontanous Breathing and Patient connected to face mask oxygen  Post-op Assessment: Report given to RN, Post -op Vital signs reviewed and stable and Patient moving all extremities  Post vital signs: Reviewed and stable  Last Vitals:  Vitals:   08/10/16 1115 08/10/16 1120  BP: 112/76 118/71  Resp: 14 16  Temp:      Last Pain: There were no vitals filed for this visit.       Complications: No apparent anesthesia complications

## 2016-08-10 NOTE — Op Note (Signed)
08/10/2016  1:04 PM  PATIENT:  Susan Gross  36 y.o. female  PRE-OPERATIVE DIAGNOSIS:  left ovarian cyst chronic pelvic pain in female  POST-OPERATIVE DIAGNOSIS:  left ovarian cyst chronic pelvic pain in female, pelvic adhesions left adnexa  PROCEDURE:  Procedure(s) with comments: LAPAROSCOPIC SALPINGO OOPHORECTOMY (Left) - possible open procedure Laparoscopic left salpingo-oophorectomy SURGEON:  Surgeon(s) and Role:    * Jonnie Kind, MD - Primary  PHYSICIAN ASSISTANT:   ASSISTANTS: Zoila Shutter CST   ANESTHESIA:   local and general  EBL:  Total I/O In: 1000 [I.V.:1000] Out: 115 [Urine:100; Blood:15]  BLOOD ADMINISTERED:none  DRAINS: none   LOCAL MEDICATIONS USED:  MARCAINE    and Amount: 10 ml in abdominal wall  SPECIMEN:  Source of Specimen:  Left fallopian tube and ovary  DISPOSITION OF SPECIMEN:  PATHOLOGY  COUNTS:  YES  TOURNIQUET:  * No tourniquets in log *  DICTATION: .Dragon Dictation  PLAN OF CARE: Discharge to home after PACU  PATIENT DISPOSITION:  PACU - hemodynamically stable.   Delay start of Pharmacological VTE agent (>24hrs) due to surgical blood loss or risk of bleeding: not applicable \ Details of procedure: Patient was taken operating room prepped and draped for lower abdominal surgery with low lithotomy leg support, Foley catheter in place and abdomen and perineum prepped and draped. Sponge stick was placed in the vagina after timeout was conducted. Ancef was administered. The infraumbilical vertical skin incision was made is 1 cm as well as a transverse suprapubic 2 cm incision with right lower quadrant trocar site also used. A Veress needle was used to very carefully, oriented toward the pelvis we could insufflate under 4 mm intra-abdominal pressure which was easily accomplished. Pneumoperitoneum allowed introduction of a 10 mm laparoscopic trocar through the umbilicus under direct visualization. There were no adhesions to the anterior  abdominal wall. Inspection of the pelvis revealed like fat adhesions to the pelvic floor and the left ovary and tube to be adherent to the left pelvic sidewall area and the ureters were easily visible and retroperitoneum and well out of the surgical field. The right tube and ovary were normal in appearance and mobile well away from the surgical cuff of the vagina the left tube and ovary were then exposed. First we took down the epiploic adhesions to the sigmoid colon and took photos to document progress. We then identified the ureter in the retroperitoneum confirmed that we well away from it and then did harmonic scalpel, harmonic Ace 7, coagulation of the left IP ligament, and then peeled the remaining remnants of adhesions off the pelvic sidewall was taken out with an Endo Catch bag through the suprapubic trocar site Pelvis was inspected confirmed as hemostatic. The abdomen was deflated, 120 cc saline placed in the abdomen to assist with evacuation of the abdominal carbon dioxide, and then the fascia closed at the umbilical and suprapubic site with 0 Vicryl and then all 3 sites closed with cuticular 4-0 Vicryl at skin level. Sponge and needle counts were correct patient to recovery room with moderate's the significant discomfort. She has excellent support at home and they've been instructed in her care and she'll be discharged home this p.m. Including monitoring for fever, etc. Sponge and needle counts correct.

## 2016-08-10 NOTE — Anesthesia Preprocedure Evaluation (Signed)
Anesthesia Evaluation  Patient identified by MRN, date of birth, ID band Patient awake    Reviewed: Allergy & Precautions, NPO status , Patient's Chart, lab work & pertinent test results  Airway Mallampati: II  TM Distance: >3 FB     Dental  (+) Edentulous Upper   Pulmonary Current Smoker,    breath sounds clear to auscultation       Cardiovascular negative cardio ROS   Rhythm:Regular Rate:Normal     Neuro/Psych  Headaches, PSYCHIATRIC DISORDERS Anxiety Depression    GI/Hepatic negative GI ROS,   Endo/Other    Renal/GU      Musculoskeletal  (+) Fibromyalgia -  Abdominal   Peds  Hematology   Anesthesia Other Findings   Reproductive/Obstetrics                             Anesthesia Physical Anesthesia Plan  ASA: II  Anesthesia Plan: General   Post-op Pain Management:    Induction: Intravenous  Airway Management Planned: Oral ETT  Additional Equipment:   Intra-op Plan:   Post-operative Plan: Extubation in OR  Informed Consent: I have reviewed the patients History and Physical, chart, labs and discussed the procedure including the risks, benefits and alternatives for the proposed anesthesia with the patient or authorized representative who has indicated his/her understanding and acceptance.     Plan Discussed with:   Anesthesia Plan Comments:         Anesthesia Quick Evaluation

## 2016-08-10 NOTE — Discharge Instructions (Signed)
Diagnostic Laparoscopy A diagnostic laparoscopy is a procedure to diagnose diseases in the abdomen. During the procedure, a thin, lighted, pencil-sized instrument called a laparoscope is inserted into the abdomen through an incision. The laparoscope allows your health care provider to look at the organs inside your body. Tell a health care provider about:  Any allergies you have.  All medicines you are taking, including vitamins, herbs, eye drops, creams, and over-the-counter medicines.  Any problems you or family members have had with anesthetic medicines.  Any blood disorders you have.  Any surgeries you have had.  Any medical conditions you have. What are the risks? Generally, this is a safe procedure. However, problems can occur, which may include:  Infection.  Bleeding.  Damage to other organs.  Allergic reaction to the anesthetics used during the procedure. What happens before the procedure?  Do not eat or drink anything after midnight on the night before the procedure or as directed by your health care provider.  Ask your health care provider about:  Changing or stopping your regular medicines.  Taking medicines such as aspirin and ibuprofen. These medicines can thin your blood. Do not take these medicines before your procedure if your health care provider instructs you not to.  Plan to have someone take you home after the procedure. What happens during the procedure?  You may be given a medicine to help you relax (sedative).  You will be given a medicine to make you sleep (general anesthetic).  Your abdomen will be inflated with a gas. This will make your organs easier to see.  Small incisions will be made in your abdomen.  A laparoscope and other small instruments will be inserted into the abdomen through the incisions.  A tissue sample may be removed from an organ in the abdomen for examination.  The instruments will be removed from the abdomen.  The gas  will be released.  The incisions will be closed with stitches (sutures). What happens after the procedure? Your blood pressure, heart rate, breathing rate, and blood oxygen level will be monitored often until the medicines you were given have worn off. This information is not intended to replace advice given to you by your health care provider. Make sure you discuss any questions you have with your health care provider. Document Released: 09/20/2000 Document Revised: 10/23/2015 Document Reviewed: 01/25/2014 Elsevier Interactive Patient Education  2017 [employer]  

## 2016-08-10 NOTE — Brief Op Note (Signed)
08/10/2016  1:04 PM  PATIENT:  Susan Gross  36 y.o. female  PRE-OPERATIVE DIAGNOSIS:  left ovarian cyst chronic pelvic pain in female  POST-OPERATIVE DIAGNOSIS:  left ovarian cyst chronic pelvic pain in female, pelvic adhesions left adnexa  PROCEDURE:  Procedure(s) with comments: LAPAROSCOPIC SALPINGO OOPHORECTOMY (Left) - possible open procedure Laparoscopic left salpingo-oophorectomy SURGEON:  Surgeon(s) and Role:    * Jonnie Kind, MD - Primary  PHYSICIAN ASSISTANT:   ASSISTANTS: Zoila Shutter CST   ANESTHESIA:   local and general  EBL:  Total I/O In: 1000 [I.V.:1000] Out: 115 [Urine:100; Blood:15]  BLOOD ADMINISTERED:none  DRAINS: none   LOCAL MEDICATIONS USED:  MARCAINE    and Amount: 10 ml in abdominal wall  SPECIMEN:  Source of Specimen:  Left fallopian tube and ovary  DISPOSITION OF SPECIMEN:  PATHOLOGY  COUNTS:  YES  TOURNIQUET:  * No tourniquets in log *  DICTATION: .Dragon Dictation  PLAN OF CARE: Discharge to home after PACU  PATIENT DISPOSITION:  PACU - hemodynamically stable.   Delay start of Pharmacological VTE agent (>24hrs) due to surgical blood loss or risk of bleeding: not applicable

## 2016-08-10 NOTE — Anesthesia Postprocedure Evaluation (Signed)
Anesthesia Post Note  Patient: LENAYAH LANTZER  Procedure(s) Performed: Procedure(s) (LRB): LAPAROSCOPIC SALPINGO OOPHORECTOMY (Left)  Patient location during evaluation: PACU Anesthesia Type: General Level of consciousness: awake and patient cooperative Pain management: pain level controlled Vital Signs Assessment: post-procedure vital signs reviewed and stable Respiratory status: spontaneous breathing, nonlabored ventilation and respiratory function stable Cardiovascular status: blood pressure returned to baseline Postop Assessment: no signs of nausea or vomiting Anesthetic complications: no     Last Vitals:  Vitals:   08/10/16 1120 08/10/16 1248  BP: 118/71   Resp: 16   Temp:  36.4 C    Last Pain:  Vitals:   08/10/16 1248  PainSc: 10-Worst pain ever                 Ladainian Therien J

## 2016-08-10 NOTE — Anesthesia Procedure Notes (Signed)
Procedure Name: Intubation Date/Time: 08/10/2016 11:33 AM Performed by: Tressie Stalker E Pre-anesthesia Checklist: Patient identified, Patient being monitored, Timeout performed, Emergency Drugs available and Suction available Patient Re-evaluated:Patient Re-evaluated prior to inductionOxygen Delivery Method: Circle system utilized Preoxygenation: Pre-oxygenation with 100% oxygen Intubation Type: IV induction Ventilation: Mask ventilation without difficulty Laryngoscope Size: Mac and 3 Grade View: Grade I Tube type: Oral Tube size: 7.0 mm Number of attempts: 1 Airway Equipment and Method: Stylet Placement Confirmation: ETT inserted through vocal cords under direct vision,  positive ETCO2 and breath sounds checked- equal and bilateral Secured at: 20 cm Tube secured with: Tape Dental Injury: Teeth and Oropharynx as per pre-operative assessment

## 2016-08-11 ENCOUNTER — Encounter (HOSPITAL_COMMUNITY): Payer: Self-pay | Admitting: Obstetrics and Gynecology

## 2016-08-19 ENCOUNTER — Ambulatory Visit (INDEPENDENT_AMBULATORY_CARE_PROVIDER_SITE_OTHER): Payer: Medicaid Other | Admitting: Obstetrics and Gynecology

## 2016-08-19 ENCOUNTER — Encounter: Payer: Self-pay | Admitting: Obstetrics and Gynecology

## 2016-08-19 VITALS — BP 128/88 | HR 85 | Wt 94.6 lb

## 2016-08-19 DIAGNOSIS — Z4889 Encounter for other specified surgical aftercare: Secondary | ICD-10-CM | POA: Insufficient documentation

## 2016-08-19 DIAGNOSIS — Z3202 Encounter for pregnancy test, result negative: Secondary | ICD-10-CM

## 2016-08-19 NOTE — Progress Notes (Signed)
  Subjective:  Susan Gross is a 36 y.o. female now 9 days status post laparoscopic left salpingoophorectomy.   Review of Systems Negative except   Diet:    Bowel movements : normal.  The patient is not having any pain.  Objective:  BP 128/88 (BP Location: Right Arm, Patient Position: Sitting, Cuff Size: Normal)   Pulse 85   Wt 94 lb 9.6 oz (42.9 kg)   BMI 17.87 kg/m  General:Well developed, well nourished.  No acute distress. Abdomen: Bowel sounds normal, soft, non-tender. Pelvic Exam:    External Genitalia:  Normal.    Vagina: Normal    Cervix: Normal    Uterus: Normal    Adnexa/Bimanual: Normal  Incision(s):   Healing well, no drainage, no erythema, no hernia, no swelling, no dehiscence,   Assessment:  Post-Op 9 days s/p laparoscopic left salpingoophorectomy  Doing well postoperatively.   Plan:  1.Wound care discussed   2. . current medications. 3. Activity restrictions: none 4. return to work: now. 5. Follow up in PRN.  By signing my name below, I, Soijett Blue, attest that this documentation has been prepared under the direction and in the presence of Jonnie Kind, MD. Electronically Signed: Soijett Blue, ED Scribe. 08/19/16. 9:38 AM.  I personally performed the services described in this documentation, which was SCRIBED in my presence. The recorded information has been reviewed and considered accurate. It has been edited as necessary during review. Jonnie Kind, MD

## 2016-08-23 ENCOUNTER — Ambulatory Visit: Payer: Medicaid Other | Admitting: Orthopedic Surgery

## 2016-08-23 ENCOUNTER — Encounter: Payer: Self-pay | Admitting: Orthopedic Surgery

## 2017-01-04 ENCOUNTER — Emergency Department (HOSPITAL_COMMUNITY)
Admission: EM | Admit: 2017-01-04 | Discharge: 2017-01-04 | Disposition: A | Discharge status: Home / Self Care | Payer: Medicaid Other | Attending: Emergency Medicine | Admitting: Emergency Medicine

## 2017-01-04 ENCOUNTER — Encounter (HOSPITAL_COMMUNITY): Payer: Self-pay | Admitting: Emergency Medicine

## 2017-01-04 ENCOUNTER — Emergency Department (HOSPITAL_COMMUNITY): Payer: Medicaid Other

## 2017-01-04 DIAGNOSIS — F17210 Nicotine dependence, cigarettes, uncomplicated: Secondary | ICD-10-CM | POA: Diagnosis not present

## 2017-01-04 DIAGNOSIS — R079 Chest pain, unspecified: Secondary | ICD-10-CM | POA: Diagnosis present

## 2017-01-04 DIAGNOSIS — R0789 Other chest pain: Secondary | ICD-10-CM | POA: Insufficient documentation

## 2017-01-04 DIAGNOSIS — Z79899 Other long term (current) drug therapy: Secondary | ICD-10-CM | POA: Insufficient documentation

## 2017-01-04 DIAGNOSIS — Z791 Long term (current) use of non-steroidal anti-inflammatories (NSAID): Secondary | ICD-10-CM | POA: Diagnosis not present

## 2017-01-04 DIAGNOSIS — G8929 Other chronic pain: Secondary | ICD-10-CM | POA: Diagnosis not present

## 2017-01-04 LAB — BASIC METABOLIC PANEL
Anion gap: 10 (ref 5–15)
BUN: 5 mg/dL — AB (ref 6–20)
CHLORIDE: 101 mmol/L (ref 101–111)
CO2: 27 mmol/L (ref 22–32)
Calcium: 9.4 mg/dL (ref 8.9–10.3)
Creatinine, Ser: 0.59 mg/dL (ref 0.44–1.00)
GFR calc Af Amer: >60 mL/min (ref >60)
GFR calc non Af Amer: >60 mL/min (ref >60)
GLUCOSE: 83 mg/dL (ref 65–99)
POTASSIUM: 3.7 mmol/L (ref 3.5–5.1)
Sodium: 138 mmol/L (ref 135–145)

## 2017-01-04 LAB — CBC
HEMATOCRIT: 44.7 % (ref 36.0–46.0)
Hemoglobin: 15.5 g/dL — ABNORMAL HIGH (ref 12.0–15.0)
MCH: 31.4 pg (ref 26.0–34.0)
MCHC: 34.7 g/dL (ref 30.0–36.0)
MCV: 90.5 fL (ref 78.0–100.0)
Platelets: 198 10*3/uL (ref 150–400)
RBC: 4.94 MIL/uL (ref 3.87–5.11)
RDW: 12.8 % (ref 11.5–15.5)
WBC: 9.3 10*3/uL (ref 4.0–10.5)

## 2017-01-04 LAB — TROPONIN I: Troponin I: <0.03 ng/mL (ref <0.03)

## 2017-01-04 MED ORDER — SODIUM CHLORIDE 0.9 % IV BOLUS (SEPSIS)
1000.0000 mL | Freq: Once | INTRAVENOUS | Status: AC
  Administered 2017-01-04: 1000 mL via INTRAVENOUS

## 2017-01-04 MED ORDER — GI COCKTAIL ~~LOC~~
30.0000 mL | Freq: Once | ORAL | Status: AC
  Administered 2017-01-04: 30 mL via ORAL
  Filled 2017-01-04: qty 30

## 2017-01-04 MED ORDER — MORPHINE SULFATE (PF) 2 MG/ML IV SOLN
2.0000 mg | Freq: Once | INTRAVENOUS | Status: AC
  Administered 2017-01-04: 2 mg via INTRAVENOUS
  Filled 2017-01-04: qty 1

## 2017-01-04 MED ORDER — OMEPRAZOLE 20 MG PO CPDR: 20.0000 mg | DELAYED_RELEASE_CAPSULE | Freq: Every day | ORAL | 0 refills | Status: DC

## 2017-01-04 NOTE — ED Provider Notes (Signed)
[name] Provider Note   CSN: [id number] Arrival date & time: 01/04/17  1623     History   Chief Complaint Chief Complaint  Patient presents with  . Chest Pain    HPI Susan Gross is a 36 y.o. female.  HPI  Patient presents with concern of chest pain. She notes over the past few days she has felt generally unwell, but until today chest pain was mild. With vascular patient has increasing sharp chest pain, throat sternum. Pain is nonradiating. No relief with anything including OTC medication. Today the patient also had an episode of near-syncope, cannot specify if her chest pain was present in or worse before, during or after that episode. No subsequent confusion, disorientation.  Patient notes that she has been using substantial amounts of ibuprofen.    Past Medical History:  Diagnosis Date  . Anemia   . Anxiety   . Cancer (HCC)    cervical  . Chronic back pain   . Chronic pelvic pain in female   . Depression   . Endometriosis   . Fibromyalgia   . Headache(784.0)   . History of kidney stones   . Interstitial cystitis   . Ovarian cyst   . Suicide attempt [facility])     Patient Active Problem List   Diagnosis Date Noted  . Encounter for postoperative wound check 08/19/2016  . Postoperative pelvic peritoneal adhesions 08/10/2016  . Cyst of left ovary 07/12/2016  . Major depressive disorder, recurrent episode, moderate degree ([place]) 01/26/2016  . Major depressive disorder, recurrent episode, moderate ([place]) 01/26/2016  . Interstitial cystitis   . Chronic back pain   . Chronic pelvic pain in female   . ANXIETY 02/21/2007  . DEPRESSION 02/21/2007  . DYSLEXIA 02/21/2007    Past Surgical History:  Procedure Laterality Date  . ABDOMINAL HYSTERECTOMY    . CESAREAN SECTION    . HERNIA REPAIR Left    LIH  . LAPAROSCOPIC SALPINGO OOPHERECTOMY Left 08/10/2016   Procedure: LAPAROSCOPIC SALPINGO OOPHORECTOMY;  Surgeon: Jonnie Kind, MD;  Location: [facility];  Service: Gynecology;  Laterality: Left;  possible open procedure    OB History    Gravida Para Term Preterm AB Living   3 2 2   1 2    SAB TAB Ectopic Multiple Live Births   1               Home Medications    Prior to Admission medications   Medication Sig Start Date End Date Taking? Authorizing Provider  Carboxymethylcellulose Sodium (MOISTURIZING LUBRICANT EYE OP) Place 1-2 drops into both eyes 3 (three) times daily as needed (for dry eyes).    [provider]  diclofenac (CATAFLAM) 50 MG tablet Take 1 tablet (50 mg total) by mouth 2 (two) times daily. 07/12/16   Carole Civil, MD  gabapentin (NEURONTIN) 400 MG capsule Take 1 capsule (400 mg total) by mouth 2 (two) times daily. Patient taking differently: Take 400 mg by mouth 3 (three) times daily as needed (for pain.).  07/12/16   Jonnie Kind, MD  ibuprofen (ADVIL,MOTRIN) 200 MG tablet Take 800 mg by mouth every 6 (six) hours as needed (for pain.). pain     [provider]  ketorolac (TORADOL) 10 MG tablet Take 1 tablet (10 mg total) by mouth every 6 (six) hours as needed (five day limit postop). Patient not taking: Reported on 08/19/2016 08/10/16   Jonnie Kind, MD  lactose free nutrition (BOOST) LIQD Take  237 mLs by mouth 2 (two) times daily.    [provider]  naproxen (NAPROSYN) 500 MG tablet Take 1 tablet (500 mg total) by mouth 2 (two) times daily. 06/23/16   Fredia Sorrow, MD  oxyCODONE-acetaminophen (PERCOCET/ROXICET) 5-325 MG tablet Take 1-2 tablets by mouth every 4 (four) hours as needed for moderate pain or severe pain. Patient not taking: Reported on 08/19/2016 08/10/16   Jonnie Kind, MD  promethazine (PHENERGAN) 25 MG tablet Take 1 tablet (25 mg total) by mouth every 6 (six) hours as needed. Patient taking differently: Take 25 mg by mouth every 6 (six) hours as needed for nausea or vomiting.  06/23/16   Fredia Sorrow, MD  traMADol (ULTRAM) 50 MG tablet Take 1 -2 every  8 hours as needed for pain Patient taking differently: Take 100 mg by mouth every 8 (eight) hours.  07/12/16   Jonnie Kind, MD    Family History Family History  Problem Relation Age of Onset  . Hypertension Mother   . Hypertension Father   . Heart attack Father   . Muscular dystrophy Brother   . Cancer Paternal Grandfather   . Other Paternal Grandmother        bleeding ulcers  . Irritable bowel syndrome Paternal Grandmother   . Fibromyalgia Maternal Grandmother   . Cancer Maternal Grandfather   . Bipolar disorder Sister   . Bipolar disorder Sister     Social History Social History  Substance Use Topics  . Smoking status: Current Every Day Smoker    Packs/day: 1.00    Years: 15.00    Types: Cigarettes  . Smokeless tobacco: Never Used  . Alcohol use No     Allergies   Ciprofloxacin and Hydrocodone   Review of Systems Review of Systems  Constitutional:       Per HPI, otherwise negative  HENT:       Per HPI, otherwise negative  Respiratory:       Per HPI, otherwise negative  Cardiovascular:       Per HPI, otherwise negative  Gastrointestinal: Negative for vomiting.  Endocrine:       Negative aside from HPI  Genitourinary:       Neg aside from HPI   Musculoskeletal:       Per HPI, otherwise negative  Skin: Negative.   Neurological: Negative for syncope.     Physical Exam Updated Vital Signs BP (!) 141/79   Pulse 61   Temp 98.6 F (37 C) (Oral)   Resp 15   Ht 5\' 2"  (1.575 m)   Wt 44 kg (97 lb)   SpO2 100%   BMI 17.74 kg/m   Physical Exam   ED Treatments / Results  Labs (all labs ordered are listed, but only abnormal results are displayed) Labs Reviewed  BASIC METABOLIC PANEL - Abnormal; Notable for the following:       Result Value   BUN 5 (*)    All other components within normal limits  CBC - Abnormal; Notable for the following:    Hemoglobin 15.5 (*)    All other components within normal limits  TROPONIN I    EKG  EKG  Interpretation None       Radiology Dg Chest 2 View  Result Date: 01/04/2017 CLINICAL DATA:  Patient states mid sternum chest pain. States she fainted at work. Hx of cervical cancer, current smoker: 1 pack a day x 21 years. EXAM: CHEST  2 VIEW COMPARISON:  06/15/2013 FINDINGS: Midline  trachea. Normal heart size and mediastinal contours. No pleural effusion or pneumothorax. Clear lungs. Mild hyperinflation. IMPRESSION: Mild hyperinflation, without acute disease. Electronically Signed   By: Abigail Miyamoto M.D.   On: 01/04/2017 17:57    Procedures Procedures (including critical care time)  Medications Ordered in ED Medications  morphine 2 MG/ML injection 2 mg (2 mg Intravenous Given 01/04/17 1912)  gi cocktail (Maalox,Lidocaine,Donnatal) (30 mLs Oral Given 01/04/17 1912)  sodium chloride 0.9 % bolus 1,000 mL (1,000 mLs Intravenous New Bag/Given 01/04/17 1912)     Initial Impression / Assessment and Plan / ED Course  I have reviewed the triage vital signs and the nursing notes.  Pertinent labs & imaging results that were available during my care of the patient were reviewed by me and considered in my medical decision making (see chart for details).  8:32 PM On repeat exam the patient is in no distress, she is awake and alert, states that she feels better, felt better after the GI cocktail. I discussed the patient's history, including history of cervical cancer, which she states was approximately 10 years ago, and she has had no recurrence. Vague and discussed the need for smoking cessation. She'll remains hemodynamically stable, with no tachypnea, no tachycardia. With reassuring labs, there is little suspicion for PE, no evidence for ongoing coronary ischemia. Symptoms may be inflammatory, with consideration of GI pathology. Patient discharged in stable condition.  Final Clinical Impressions(s) / ED Diagnoses  Atypical chest pain   Carmin Muskrat, MD 01/04/17 2034

## 2017-01-04 NOTE — ED Notes (Signed)
Pt states understanding of d/c instructions, follow up care, and prescription use. States family member will be driving her home d/t medications given in ED.

## 2017-01-04 NOTE — Discharge Instructions (Signed)
As discussed, your evaluation today has been largely reassuring.  But, it is important that you monitor your condition carefully, and do not hesitate to return to the ED if you develop new, or concerning changes in your condition. ? ?Otherwise, please follow-up with your physician for appropriate ongoing care. ? ?

## 2017-01-04 NOTE — ED Triage Notes (Signed)
migraine started yesterday.  approx 1615 while at work pt states she got dizzy,  she fainted, when she woke up her chest was hurting in center of chest

## 2017-06-30 ENCOUNTER — Other Ambulatory Visit: Payer: Self-pay

## 2017-06-30 ENCOUNTER — Ambulatory Visit (INDEPENDENT_AMBULATORY_CARE_PROVIDER_SITE_OTHER): Payer: Medicaid Other | Admitting: Physician Assistant

## 2017-06-30 ENCOUNTER — Encounter: Payer: Self-pay | Admitting: Physician Assistant

## 2017-06-30 VITALS — BP 110/80 | HR 102 | Temp 97.9°F | Resp 16 | Wt 98.4 lb

## 2017-06-30 DIAGNOSIS — M542 Cervicalgia: Secondary | ICD-10-CM | POA: Diagnosis not present

## 2017-06-30 DIAGNOSIS — F411 Generalized anxiety disorder: Secondary | ICD-10-CM

## 2017-06-30 DIAGNOSIS — F430 Acute stress reaction: Secondary | ICD-10-CM

## 2017-06-30 DIAGNOSIS — J029 Acute pharyngitis, unspecified: Secondary | ICD-10-CM | POA: Diagnosis not present

## 2017-06-30 MED ORDER — CYCLOBENZAPRINE HCL 10 MG PO TABS: 10.0000 mg | ORAL_TABLET | Freq: Three times a day (TID) | ORAL | 1 refills | Status: DC | PRN

## 2017-06-30 MED ORDER — KETOROLAC TROMETHAMINE 60 MG/2ML IM SOLN
60.0000 mg | Freq: Once | INTRAMUSCULAR | Status: AC
  Administered 2017-06-30: 60 mg via INTRAMUSCULAR

## 2017-06-30 MED ORDER — METHYLPREDNISOLONE ACETATE 40 MG/ML IJ SUSP
60.0000 mg | Freq: Once | INTRAMUSCULAR | Status: AC
  Administered 2017-06-30: 60 mg via INTRAMUSCULAR

## 2017-06-30 MED ORDER — TRAMADOL HCL 50 MG PO TABS: 50.0000 mg | ORAL_TABLET | Freq: Four times a day (QID) | ORAL | 0 refills | Status: DC | PRN

## 2017-06-30 NOTE — Progress Notes (Signed)
Patient ID: MARELLY WEHRMAN MRN: [id number], DOB: 09/26/1980, 37 y.o. Date of Encounter: @DATE @  Chief Complaint:  Chief Complaint  Patient presents with  . migraines  . Sore Throat    HPI: 37 y.o. year old female  presents with above.   She reports that she is having a lot of tension in her neck and shoulders.   States that she is having a lot of stress.   Her grandmother died in 03/23/23.  She lived with her.  Patient was the one to handle the estate etc. after the grandmother passed away.  She states that also-- since 03-23-2023-- since her grandmother passed away--- has also had a lot of other stress.   Found out that her fianc was cheating on her.   Her stepdaughter lost a baby.  States that she is working at [employer].  It is just her there.  She has to stock shelves, mop the floor etc.  Says that she called out yesterday because there was no way she could lift boxes and stock shelves etc. Says that her boss was not happy about it.  Says that she just has to have some break and needs something to relieve this pain.   Feels like if she could just get a break of from the pain and get herself together to move on.  Says that when she had seen me for similar symptoms in the past that she had gotten herself out of that bad relationship and things had gotten better.   Just needs something to help her get through this so that she can move forward again.  Also her throat has felt a little scratchy and irritated the last day or 2 so wanted to check that also. No other concerns to address today.    Past Medical History:  Diagnosis Date  . Anemia   . Anxiety   . Cancer (HCC)    cervical  . Chronic back pain   . Chronic pelvic pain in female   . Depression   . Endometriosis   . Fibromyalgia   . Headache(784.0)   . History of kidney stones   . Interstitial cystitis   . Ovarian cyst   . Suicide attempt [facility])      Home Meds: Outpatient Medications Prior to Visit   Medication Sig Dispense Refill  . Carboxymethylcellulose Sodium (MOISTURIZING LUBRICANT EYE OP) Place 1-2 drops into both eyes 3 (three) times daily as needed (for dry eyes).    Marland Kitchen omeprazole (PRILOSEC) 20 MG capsule Take 1 capsule (20 mg total) by mouth daily. Take one tablet daily 21 capsule 0  . gabapentin (NEURONTIN) 400 MG capsule Take 1 capsule (400 mg total) by mouth 2 (two) times daily. (Patient taking differently: Take 400 mg by mouth 3 (three) times daily as needed (for pain.). ) 28 capsule 2   No facility-administered medications prior to visit.     Allergies:  Allergies  Allergen Reactions  . Ciprofloxacin Nausea And Vomiting  . Hydrocodone Hives    Social History   Socioeconomic History  . Marital status: Divorced    Spouse name: Not on file  . Number of children: Not on file  . Years of education: Not on file  . Highest education level: Not on file  Social Needs  . Financial resource strain: Not on file  . Food insecurity - worry: Not on file  . Food insecurity - inability: Not on file  . Transportation needs - medical:  Not on file  . Transportation needs - non-medical: Not on file  Occupational History  . Not on file  Tobacco Use  . Smoking status: Current Every Day Smoker    Packs/day: 1.00    Years: 15.00    Pack years: 15.00    Types: Cigarettes  . Smokeless tobacco: Never Used  Substance and Sexual Activity  . Alcohol use: No  . Drug use: No    Comment: hx  . Sexual activity: Not Currently    Partners: Male    Birth control/protection: Surgical    Comment: Hysterectomy  Other Topics Concern  . Not on file  Social History Narrative  . Not on file    Family History  Problem Relation Age of Onset  . Hypertension Mother   . Hypertension Father   . Heart attack Father   . Muscular dystrophy Brother   . Cancer Paternal Grandfather   . Other Paternal Grandmother        bleeding ulcers  . Irritable bowel syndrome Paternal Grandmother   .  Fibromyalgia Maternal Grandmother   . Cancer Maternal Grandfather   . Bipolar disorder Sister   . Bipolar disorder Sister      Review of Systems:  See HPI for pertinent ROS. All other ROS negative.    Physical Exam: Blood pressure 110/80, pulse (!) 102, temperature 97.9 F (36.6 C), temperature source Oral, resp. rate 16, weight 44.6 kg (98 lb 6.4 oz), SpO2 98 %., Body mass index is 18 kg/m. General: Thin WF. Sits with her head in her hands, leaning on counter top most of visit.  Head: Normocephalic, atraumatic, eyes without discharge, sclera non-icteric, nares are without discharge. Bilateral auditory canals clear, TM's are without perforation, pearly grey and translucent with reflective cone of light bilaterally. Oral cavity moist, posterior pharynx and tonsils with mild erythema. No exudate, no peritonsillar abscess.  Neck: Supple. No thyromegaly. No lymphadenopathy. Lungs: Clear bilaterally to auscultation without wheezes, rales, or rhonchi. Breathing is unlabored. Heart: RRR with S1 S2. No murmurs, rubs, or gallops. Musculoskeletal: He has severe tenderness with palpation of both sides of the neck and scapular area bilaterally. Extremities/Skin: Warm and dry.  Neuro: Alert and oriented X 3. Moves all extremities spontaneously. Gait is normal. CNII-XII grossly in tact. Psych:  Responds to questions appropriately with a normal affect.   Results for orders placed or performed in visit on 06/30/17  STREP GROUP A AG, W/REFLEX TO CULT  Result Value Ref Range   Streptococcus, Group A Screen (Direct) NONE DETECTED      ASSESSMENT AND PLAN:  37 y.o. year old female with  1. Neck pain Gave Toradol injection 60 mg IM here. Gave Depo-Medrol 60 mg IM here. This should give her some immediate relief and get things to a more tolerable level. She can then use the tramadol and Flexeril for symptom relief at home.  She is aware that these medications can cause drowsiness and not to take them  prior to driving. Note given to cover her being out of work yesterday today and tomorrow with plans for her to return to work Saturday, 07/02/2017. - cyclobenzaprine (FLEXERIL) 10 MG tablet; Take 1 tablet (10 mg total) by mouth 3 (three) times daily as needed for muscle spasms.  Dispense: 30 tablet; Refill: 1 - traMADol (ULTRAM) 50 MG tablet; Take 1 tablet (50 mg total) by mouth every 6 (six) hours as needed.  Dispense: 40 tablet; Refill: 0  2. Stress disorder, acute  3. Anxiety  state  4. Sore throat Strep test negative.  Follow-up if symptoms worsen or persist greater than 7 days. - STREP GROUP A AG, W/REFLEX TO CULT   Signed, [address] [place], [place], [facility] 06/30/2017 3:12 PM

## 2017-06-30 NOTE — Addendum Note (Signed)
Addended by: Vonna Kotyk A on: 06/30/2017 04:32 PM   Modules accepted: Orders

## 2017-07-02 LAB — CULTURE, GROUP A STREP
MICRO NUMBER: 90008665
SOURCE: 0
SPECIMEN QUALITY:: ADEQUATE

## 2017-07-02 LAB — STREP GROUP A AG, W/REFLEX TO CULT: STREPTOCOCCUS, GROUP A SCREEN (DIRECT): NOT DETECTED

## 2017-07-26 ENCOUNTER — Other Ambulatory Visit: Payer: Self-pay | Admitting: Physician Assistant

## 2017-07-26 DIAGNOSIS — M542 Cervicalgia: Secondary | ICD-10-CM

## 2017-08-14 ENCOUNTER — Other Ambulatory Visit: Payer: Self-pay | Admitting: Physician Assistant

## 2017-08-14 DIAGNOSIS — M542 Cervicalgia: Secondary | ICD-10-CM

## 2017-08-15 NOTE — Telephone Encounter (Signed)
Last OV 06/30/2017 Last refill 06/30/2017 Okay to refill?

## 2017-08-22 ENCOUNTER — Ambulatory Visit (INDEPENDENT_AMBULATORY_CARE_PROVIDER_SITE_OTHER): Payer: Medicaid Other | Admitting: Physician Assistant

## 2017-08-22 ENCOUNTER — Encounter: Payer: Self-pay | Admitting: Physician Assistant

## 2017-08-22 VITALS — BP 144/86 | HR 92 | Temp 97.9°F | Resp 16 | Wt 96.0 lb

## 2017-08-22 DIAGNOSIS — M797 Fibromyalgia: Secondary | ICD-10-CM

## 2017-08-22 DIAGNOSIS — R102 Pelvic and perineal pain: Secondary | ICD-10-CM

## 2017-08-22 DIAGNOSIS — M542 Cervicalgia: Secondary | ICD-10-CM

## 2017-08-22 DIAGNOSIS — G8929 Other chronic pain: Secondary | ICD-10-CM

## 2017-08-22 MED ORDER — DULOXETINE HCL 60 MG PO CPEP: 60.0000 mg | ORAL_CAPSULE | Freq: Every day | ORAL | 1 refills | Status: DC

## 2017-08-22 MED ORDER — GABAPENTIN 300 MG PO CAPS: ORAL_CAPSULE | ORAL | 3 refills | Status: DC

## 2017-08-22 NOTE — Progress Notes (Signed)
Patient ID: Susan Gross MRN: [id number], DOB: 01/27/1981, 37 y.o. Date of Encounter: @DATE @  Chief Complaint:  Chief Complaint  Patient presents with  . Hip Pain    suffers from Fibromyalgia    HPI: 37 y.o. year old female  presents with above.     06/30/2017: She reports that she is having a lot of tension in her neck and shoulders.   States that she is having a lot of stress.   Her grandmother died in 03/14/2023.  She lived with her.  Patient was the one to handle the estate etc. after the grandmother passed away.  She states that also-- since 03-14-2023-- since her grandmother passed away--- has also had a lot of other stress.   Found out that her fianc was cheating on her.   Her stepdaughter lost a baby.  States that she is working at [employer].  It is just her there.  She has to stock shelves, mop the floor etc.  Says that she called out yesterday because there was no way she could lift boxes and stock shelves etc. Says that her boss was not happy about it.  Says that she just has to have some break and needs something to relieve this pain.   Feels like if she could just get a break of from the pain and get herself together to move on.  Says that when she had seen me for similar symptoms in the past that she had gotten herself out of that bad relationship and things had gotten better.   Just needs something to help her get through this so that she can move forward again.  AT THAT OV: 1. Neck pain Gave Toradol injection 60 mg IM here. Gave Depo-Medrol 60 mg IM here. This should give her some immediate relief and get things to a more tolerable level. She can then use the tramadol and Flexeril for symptom relief at home.  She is aware that these medications can cause drowsiness and not to take them prior to driving. Note given to cover her being out of work yesterday today and tomorrow with plans for her to return to work Saturday, 07/02/2017. - cyclobenzaprine  (FLEXERIL) 10 MG tablet; Take 1 tablet (10 mg total) by mouth 3 (three) times daily as needed for muscle spasms.  Dispense: 30 tablet; Refill: 1 - traMADol (ULTRAM) 50 MG tablet; Take 1 tablet (50 mg total) by mouth every 6 (six) hours as needed.  Dispense: 40 tablet; Refill: 0  2. Stress disorder, acute  3. Anxiety state    08/22/2017: Today she states that her neck and shoulders have been hurting again and is having pain across her pelvic region on both sides as well.  Says that she has had this pain in the past and it is recurrent. Says that she is still working at that same [employer].  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  Says that since she found out her fianc was cheating on her-- now she is also having to find somewhere to live.  I asked where she is living now-- and she says that she is staying at her mom's "but that is stressful" says that they "is a lot of drinking alcohol" and she has her son stay in the room with her and she  pretty much tries to go there just to sleep and not be there otherwise because that is a stressful situation also. States that after the treatment I gave her at visit 06/30/17 she felt better for about a week or so but then the symptoms have been gradually coming back.  Says that she is taking the tramadol and the Flexeril but this is not controlling her symptoms.  Asked if she has been doing some stretches and she says that she does stretch and tries different yoga positions and applies heat and cold.  Asked whether she has used Cymbalta in the past and she says that she was on that in the past and it did help.  It caused no adverse effects.  Also asked about Neurontin, Lyrica.  Says that she has been on those in the past also and does not think that they helped very much.  Past Medical History:  Diagnosis Date  . Anemia    . Anxiety   . Cancer (HCC)    cervical  . Chronic back pain   . Chronic pelvic pain in female   . Depression   . Endometriosis   . Fibromyalgia   . Headache(784.0)   . History of kidney stones   . Interstitial cystitis   . Ovarian cyst   . Suicide attempt [facility])      Home Meds: Outpatient Medications Prior to Visit  Medication Sig Dispense Refill  . Carboxymethylcellulose Sodium (MOISTURIZING LUBRICANT EYE OP) Place 1-2 drops into both eyes 3 (three) times daily as needed (for dry eyes).    . cyclobenzaprine (FLEXERIL) 10 MG tablet Take 1 tablet (10 mg total) by mouth 3 (three) times daily as needed for muscle spasms. 30 tablet 1  . omeprazole (PRILOSEC) 20 MG capsule Take 1 capsule (20 mg total) by mouth daily. Take one tablet daily 21 capsule 0  . traMADol (ULTRAM) 50 MG tablet TAKE 1 TABLET BY MOUTH EVERY 6 HOURS AS NEEDED 40 tablet 0   No facility-administered medications prior to visit.     Allergies:  Allergies  Allergen Reactions  . Ciprofloxacin Nausea And Vomiting  . Hydrocodone Hives    Social History   Socioeconomic History  . Marital status: Divorced    Spouse name: Not on file  . Number of children: Not on file  . Years of education: Not on file  . Highest education level: Not on file  Social Needs  . Financial resource strain: Not on file  . Food insecurity - worry: Not on file  . Food insecurity - inability: Not on file  . Transportation needs - medical: Not on file  . Transportation needs - non-medical: Not on file  Occupational History  . Not on file  Tobacco Use  . Smoking status: Current Every Day Smoker    Packs/day: 1.00    Years: 15.00    Pack years: 15.00    Types: Cigarettes  . Smokeless tobacco: Never Used  Substance and Sexual Activity  . Alcohol use: No  . Drug use: No    Comment: hx  . Sexual activity: Not Currently    Partners: Male    Birth control/protection: Surgical    Comment: Hysterectomy  Other Topics Concern  . Not  on file  Social History Narrative  . Not on file    Family History  Problem Relation Age of Onset  . Hypertension Mother   . Hypertension Father   . Heart attack Father   . Muscular dystrophy Brother   .  Cancer Paternal Grandfather   . Other Paternal Grandmother        bleeding ulcers  . Irritable bowel syndrome Paternal Grandmother   . Fibromyalgia Maternal Grandmother   . Cancer Maternal Grandfather   . Bipolar disorder Sister   . Bipolar disorder Sister      Review of Systems:  See HPI for pertinent ROS. All other ROS negative.    Physical Exam: Blood pressure (!) 144/86, pulse 92, temperature 97.9 F (36.6 C), temperature source Oral, resp. rate 16, weight 43.5 kg (96 lb), SpO2 98 %., Body mass index is 17.56 kg/m. General: Thin WF. Appears stressed. Neck: Supple. No thyromegaly. No lymphadenopathy. Lungs: Clear bilaterally to auscultation without wheezes, rales, or rhonchi. Breathing is unlabored. Heart: RRR with S1 S2. No murmurs, rubs, or gallops. Musculoskeletal: He has severe tenderness with palpation of both sides of the neck and scapular area bilaterally.  Tenderness with palpation across low back bilaterally also. Extremities/Skin: Warm and dry.  Neuro: Alert and oriented X 3. Moves all extremities spontaneously. Gait is normal. CNII-XII grossly in tact. Psych:  Responds to questions appropriately with a normal affect.      ASSESSMENT AND PLAN:  37 y.o. year old female with    1. Chronic pelvic pain in female  2. Neck pain  3. Fibromyalgia Will add Cymbalta and Gabapentin. Will have her return for f/u OV in 2 months. F/U sooner if needed. - DULoxetine (CYMBALTA) 60 MG capsule; Take 1 capsule (60 mg total) by mouth daily.  Dispense: 30 capsule; Refill: 1 - gabapentin (NEURONTIN) 300 MG capsule; Day 1: Take one at bedtime. Day 2; Take one twice a day. Day 3 and after: Take one 3 times a day.  Dispense: 90 capsule; Refill: 3  Signed, [address] [place], [place],  [facility] 08/22/2017 2:39 PM

## 2017-08-24 ENCOUNTER — Other Ambulatory Visit: Payer: Self-pay

## 2017-08-24 NOTE — Telephone Encounter (Signed)
Prior authorization started on Tramadol

## 2017-08-24 NOTE — Telephone Encounter (Signed)
Prior authorization started on Tramadol today

## 2017-08-26 NOTE — Telephone Encounter (Signed)
Prior authorization approved for Tramadol 50 mg pharmacy aware

## 2017-10-10 ENCOUNTER — Encounter: Payer: Self-pay | Admitting: Physician Assistant

## 2017-10-20 ENCOUNTER — Ambulatory Visit: Payer: Medicaid Other | Admitting: Physician Assistant

## 2017-11-07 ENCOUNTER — Encounter: Payer: Self-pay | Admitting: Physician Assistant

## 2017-11-23 ENCOUNTER — Other Ambulatory Visit: Payer: Self-pay | Admitting: Physician Assistant

## 2017-11-23 DIAGNOSIS — M542 Cervicalgia: Secondary | ICD-10-CM

## 2017-12-06 ENCOUNTER — Other Ambulatory Visit: Payer: Self-pay | Admitting: Physician Assistant

## 2017-12-06 DIAGNOSIS — M542 Cervicalgia: Secondary | ICD-10-CM

## 2017-12-10 ENCOUNTER — Emergency Department (HOSPITAL_COMMUNITY)
Admission: EM | Admit: 2017-12-10 | Discharge: 2017-12-10 | Disposition: A | Discharge status: Home / Self Care | Payer: Medicaid Other | Attending: Emergency Medicine | Admitting: Emergency Medicine

## 2017-12-10 ENCOUNTER — Encounter (HOSPITAL_COMMUNITY): Payer: Self-pay

## 2017-12-10 DIAGNOSIS — R1032 Left lower quadrant pain: Secondary | ICD-10-CM | POA: Insufficient documentation

## 2017-12-10 DIAGNOSIS — Z5321 Procedure and treatment not carried out due to patient leaving prior to being seen by health care provider: Secondary | ICD-10-CM | POA: Insufficient documentation

## 2017-12-10 LAB — COMPREHENSIVE METABOLIC PANEL
ALBUMIN: 4.5 g/dL (ref 3.5–5.0)
ALT: 14 U/L (ref 14–54)
AST: 17 U/L (ref 15–41)
Alkaline Phosphatase: 52 U/L (ref 38–126)
Anion gap: 7 (ref 5–15)
BILIRUBIN TOTAL: 0.7 mg/dL (ref 0.3–1.2)
BUN: 10 mg/dL (ref 6–20)
CO2: 24 mmol/L (ref 22–32)
Calcium: 8.7 mg/dL — ABNORMAL LOW (ref 8.9–10.3)
Chloride: 103 mmol/L (ref 101–111)
Creatinine, Ser: 0.58 mg/dL (ref 0.44–1.00)
GFR calc Af Amer: >60 mL/min (ref >60)
GFR calc non Af Amer: >60 mL/min (ref >60)
Glucose, Bld: 96 mg/dL (ref 65–99)
POTASSIUM: 3.6 mmol/L (ref 3.5–5.1)
Sodium: 134 mmol/L — ABNORMAL LOW (ref 135–145)
TOTAL PROTEIN: 7.2 g/dL (ref 6.5–8.1)

## 2017-12-10 LAB — CBC
HEMATOCRIT: 44.1 % (ref 36.0–46.0)
HEMOGLOBIN: 15 g/dL (ref 12.0–15.0)
MCH: 31.2 pg (ref 26.0–34.0)
MCHC: 34 g/dL (ref 30.0–36.0)
MCV: 91.7 fL (ref 78.0–100.0)
Platelets: 206 10*3/uL (ref 150–400)
RBC: 4.81 MIL/uL (ref 3.87–5.11)
RDW: 12.9 % (ref 11.5–15.5)
WBC: 7.3 10*3/uL (ref 4.0–10.5)

## 2017-12-10 LAB — LIPASE, BLOOD: LIPASE: 37 U/L (ref 11–51)

## 2017-12-10 NOTE — ED Notes (Signed)
Call from registration, pt notified leaving [facility]

## 2017-12-10 NOTE — ED Triage Notes (Signed)
Pt reports that she had hernia sx in 2008. Left groin. Pain has been increasing over the past 2 weeks . She is nauseated and unable to have a BM x 4 days due to pain . Pt denies any urinary symptoms

## 2017-12-12 ENCOUNTER — Emergency Department (HOSPITAL_COMMUNITY)
Admission: EM | Admit: 2017-12-12 | Discharge: 2017-12-12 | Disposition: A | Discharge status: Home / Self Care | Payer: Medicaid Other | Attending: Emergency Medicine | Admitting: Emergency Medicine

## 2017-12-12 ENCOUNTER — Telehealth: Payer: Self-pay

## 2017-12-12 ENCOUNTER — Encounter (HOSPITAL_COMMUNITY): Payer: Self-pay | Admitting: *Deleted

## 2017-12-12 DIAGNOSIS — Z8541 Personal history of malignant neoplasm of cervix uteri: Secondary | ICD-10-CM | POA: Insufficient documentation

## 2017-12-12 DIAGNOSIS — N390 Urinary tract infection, site not specified: Secondary | ICD-10-CM | POA: Diagnosis not present

## 2017-12-12 DIAGNOSIS — Z79899 Other long term (current) drug therapy: Secondary | ICD-10-CM | POA: Diagnosis not present

## 2017-12-12 DIAGNOSIS — R1032 Left lower quadrant pain: Secondary | ICD-10-CM | POA: Diagnosis present

## 2017-12-12 DIAGNOSIS — F17210 Nicotine dependence, cigarettes, uncomplicated: Secondary | ICD-10-CM | POA: Diagnosis not present

## 2017-12-12 LAB — COMPREHENSIVE METABOLIC PANEL
ALT: 14 U/L (ref 14–54)
AST: 18 U/L (ref 15–41)
Albumin: 4.2 g/dL (ref 3.5–5.0)
Alkaline Phosphatase: 51 U/L (ref 38–126)
Anion gap: 8 (ref 5–15)
BILIRUBIN TOTAL: 0.5 mg/dL (ref 0.3–1.2)
BUN: 5 mg/dL — AB (ref 6–20)
CHLORIDE: 105 mmol/L (ref 101–111)
CO2: 25 mmol/L (ref 22–32)
Calcium: 8.9 mg/dL (ref 8.9–10.3)
Creatinine, Ser: 0.6 mg/dL (ref 0.44–1.00)
Glucose, Bld: 92 mg/dL (ref 65–99)
POTASSIUM: 3.4 mmol/L — AB (ref 3.5–5.1)
Sodium: 138 mmol/L (ref 135–145)
TOTAL PROTEIN: 6.5 g/dL (ref 6.5–8.1)

## 2017-12-12 LAB — URINALYSIS, ROUTINE W REFLEX MICROSCOPIC
BILIRUBIN URINE: NEGATIVE
GLUCOSE, UA: NEGATIVE mg/dL
HGB URINE DIPSTICK: NEGATIVE
Ketones, ur: NEGATIVE mg/dL
LEUKOCYTES UA: NEGATIVE
NITRITE: POSITIVE — AB
PH: 6 (ref 5.0–8.0)
Protein, ur: NEGATIVE mg/dL
SPECIFIC GRAVITY, URINE: 1.005 (ref 1.005–1.030)

## 2017-12-12 LAB — CBC
HCT: 42.4 % (ref 36.0–46.0)
Hemoglobin: 14.1 g/dL (ref 12.0–15.0)
MCH: 30.7 pg (ref 26.0–34.0)
MCHC: 33.3 g/dL (ref 30.0–36.0)
MCV: 92.2 fL (ref 78.0–100.0)
PLATELETS: 201 10*3/uL (ref 150–400)
RBC: 4.6 MIL/uL (ref 3.87–5.11)
RDW: 12.7 % (ref 11.5–15.5)
WBC: 8.2 10*3/uL (ref 4.0–10.5)

## 2017-12-12 LAB — I-STAT BETA HCG BLOOD, ED (MC, WL, AP ONLY): I-stat hCG, quantitative: <5 m[IU]/mL (ref <5)

## 2017-12-12 LAB — LIPASE, BLOOD: LIPASE: 33 U/L (ref 11–51)

## 2017-12-12 MED ORDER — CEPHALEXIN 500 MG PO CAPS: 500.0000 mg | ORAL_CAPSULE | Freq: Two times a day (BID) | ORAL | 0 refills | Status: DC

## 2017-12-12 MED ORDER — TRAMADOL HCL 50 MG PO TABS: 50.0000 mg | ORAL_TABLET | Freq: Four times a day (QID) | ORAL | 0 refills | Status: DC | PRN

## 2017-12-12 MED ORDER — OXYCODONE-ACETAMINOPHEN 5-325 MG PO TABS
1.0000 | ORAL_TABLET | Freq: Once | ORAL | Status: AC
  Administered 2017-12-12: 1 via ORAL
  Filled 2017-12-12: qty 1

## 2017-12-12 NOTE — ED Notes (Signed)
Follow up call made  No answer   12/12/17  s Tekla Malachowski rn

## 2017-12-12 NOTE — ED Triage Notes (Signed)
Pt in c/o suprapubic abdominal pain on the left side and also left upper abdominal pain for the last two weeks, pt reports constipation as well, states she had a hernia repair years ago and is concerned this is related.

## 2017-12-12 NOTE — ED Provider Notes (Signed)
[place] EMERGENCY DEPARTMENT Provider Note   CSN: [id number] Arrival date & time: 12/12/17  1552     History   Chief Complaint Chief Complaint  Patient presents with  . Abdominal Pain    HPI Susan Gross is a 37 y.o. female.  HPI   37 year old female presents today with complaints of abdominal pain.  Patient notes a history of inguinal hernia repair in 1998.  She notes after that time she has had minor pain in her left lower pelvis.  Patient notes that over the last 2 weeks she has had worsening pain worse with palpation, worse with movement.  She denies any upper abdominal pain, nausea vomiting, diarrhea or constipation.  Patient denies any vaginal bleeding or discharge.  She does note a stronger odor and darker urine but no dysuria.  Patient is status post hysterectomy, but notes that she still has a left ovary.  Patient notes that normally she takes Ultram at home for chronic pain but was unable to have her medications refilled as she has a small balance at her primary care provider's practice.  Patient notes using ibuprofen without significant improvement in her symptoms.  Patient denies any fever.         Past Medical History:  Diagnosis Date  . Anemia   . Anxiety   . Cancer (HCC)    cervical  . Chronic back pain   . Chronic pelvic pain in female   . Depression   . Endometriosis   . Fibromyalgia   . Headache(784.0)   . History of kidney stones   . Interstitial cystitis   . Ovarian cyst   . Suicide attempt [facility])     Patient Active Problem List   Diagnosis Date Noted  . Encounter for postoperative wound check 08/19/2016  . Postoperative pelvic peritoneal adhesions 08/10/2016  . Cyst of left ovary 07/12/2016  . Major depressive disorder, recurrent episode, moderate degree ([place]) 01/26/2016  . Major depressive disorder, recurrent episode, moderate ([place]) 01/26/2016  . Interstitial cystitis   . Chronic back pain   . Chronic pelvic pain  in female   . ANXIETY 02/21/2007  . DEPRESSION 02/21/2007  . DYSLEXIA 02/21/2007    Past Surgical History:  Procedure Laterality Date  . ABDOMINAL HYSTERECTOMY    . CESAREAN SECTION    . HERNIA REPAIR Left    LIH  . LAPAROSCOPIC SALPINGO OOPHERECTOMY Left 08/10/2016   Procedure: LAPAROSCOPIC SALPINGO OOPHORECTOMY;  Surgeon: Jonnie Kind, MD;  Location: [facility];  Service: Gynecology;  Laterality: Left;  possible open procedure     OB History    Gravida  3   Para  2   Term  2   Preterm      AB  1   Living  2     SAB  1   TAB      Ectopic      Multiple      Live Births               Home Medications    Prior to Admission medications   Medication Sig Start Date End Date Taking? Authorizing Provider  Carboxymethylcellulose Sodium (MOISTURIZING LUBRICANT EYE OP) Place 1-2 drops into both eyes 3 (three) times daily as needed (for dry eyes).    [provider]  cephALEXin (KEFLEX) 500 MG capsule Take 1 capsule (500 mg total) by mouth 2 (two) times daily. 12/12/17   Airik Goodlin, Dellis Filbert, PA-C  cyclobenzaprine (FLEXERIL) 10 MG tablet  Take 1 tablet (10 mg total) by mouth 3 (three) times daily as needed for muscle spasms. 06/30/17   Orlena Sheldon, PA-C  DULoxetine (CYMBALTA) 60 MG capsule Take 1 capsule (60 mg total) by mouth daily. 08/22/17   Dena Billet B, PA-C  gabapentin (NEURONTIN) 300 MG capsule Day 1: Take one at bedtime. Day 2; Take one twice a day. Day 3 and after: Take one 3 times a day. 08/22/17   Orlena Sheldon, PA-C  omeprazole (PRILOSEC) 20 MG capsule Take 1 capsule (20 mg total) by mouth daily. Take one tablet daily 01/04/17   Carmin Muskrat, MD  traMADol (ULTRAM) 50 MG tablet Take 1 tablet (50 mg total) by mouth every 6 (six) hours as needed. 12/12/17   Okey Regal, PA-C    Family History Family History  Problem Relation Age of Onset  . Hypertension Mother   . Hypertension Father   . Heart attack Father   . Muscular dystrophy Brother   . Cancer  Paternal Grandfather   . Other Paternal Grandmother        bleeding ulcers  . Irritable bowel syndrome Paternal Grandmother   . Fibromyalgia Maternal Grandmother   . Cancer Maternal Grandfather   . Bipolar disorder Sister   . Bipolar disorder Sister     Social History Social History   Tobacco Use  . Smoking status: Current Every Day Smoker    Packs/day: 1.00    Years: 15.00    Pack years: 15.00    Types: Cigarettes  . Smokeless tobacco: Never Used  Substance Use Topics  . Alcohol use: No  . Drug use: Yes    Types: Marijuana    Comment: hx     Allergies   Ciprofloxacin and Hydrocodone   Review of Systems Review of Systems  All other systems reviewed and are negative.    Physical Exam Updated Vital Signs BP 124/70 (BP Location: Left Arm)   Pulse 90   Temp 98.4 F (36.9 C) (Oral)   Resp 12   SpO2 98%   Physical Exam  Constitutional: She is oriented to person, place, and time. She appears well-developed and well-nourished.  HENT:  Head: Normocephalic and atraumatic.  Eyes: Pupils are equal, round, and reactive to light. Conjunctivae are normal. Right eye exhibits no discharge. Left eye exhibits no discharge. No scleral icterus.  Neck: Normal range of motion. No JVD present. No tracheal deviation present.  Pulmonary/Chest: Effort normal. No stridor.  Abdominal:  Abdomen without swelling or edema, no masses, tenderness to even light palpation of the left lower quadrant and inguinal region, no hernias noted  Neurological: She is alert and oriented to person, place, and time. Coordination normal.  Psychiatric: She has a normal mood and affect. Her behavior is normal. Judgment and thought content normal.  Nursing note and vitals reviewed.    ED Treatments / Results  Labs (all labs ordered are listed, but only abnormal results are displayed) Labs Reviewed  COMPREHENSIVE METABOLIC PANEL - Abnormal; Notable for the following components:      Result Value    Potassium 3.4 (*)    BUN 5 (*)    All other components within normal limits  URINALYSIS, ROUTINE W REFLEX MICROSCOPIC - Abnormal; Notable for the following components:   Nitrite POSITIVE (*)    Bacteria, UA MANY (*)    All other components within normal limits  LIPASE, BLOOD  CBC  I-STAT BETA HCG BLOOD, ED ([facility], [facility], [facility] ONLY)    EKG  None  Radiology No results found.  Procedures Procedures (including critical care time)  Medications Ordered in ED Medications  oxyCODONE-acetaminophen (PERCOCET/ROXICET) 5-325 MG per tablet 1 tablet (1 tablet Oral Given 12/12/17 1828)     Initial Impression / Assessment and Plan / ED Course  I have reviewed the triage vital signs and the nursing notes.  Pertinent labs & imaging results that were available during my care of the patient were reviewed by me and considered in my medical decision making (see chart for details).    Labs: Urinalysis, i-STAT beta-hCG, lipase, CMP CBC  Imaging:  Consults:  Therapeutics:  Discharge Meds:   Assessment/Plan:   37 year old female presents today with complaints of abdominal pain.  Patient has reassuring laboratory analysis with no elevation in white count afebrile.  I have low suspicion for acute intra-abdominal pathology.  Patient does have mesh that was placed at the site of her pain, no hernia.  She denies vaginal bleeding or discharge low suspicion for pelvic etiology.  Patient does have a urinary tract infection although her urine is mixed she does have nitrite positive with many bacteria.  She has had change in odor in color and will be treated for UTI.  Patient will follow-up with general surgery for reevaluation of the surgical site, she is given strict return precautions, she verbalized understanding and agreement to today's plan had no further questions or concerns.  Final Clinical Impressions(s) / ED Diagnoses   Final diagnoses:  Urinary tract infection without hematuria, site unspecified    Left lower quadrant pain    ED Discharge Orders        Ordered    cephALEXin (KEFLEX) 500 MG capsule  2 times daily     12/12/17 1918    traMADol (ULTRAM) 50 MG tablet  Every 6 hours PRN     12/12/17 1918       Francee Gentile 12/12/17 2109    Quintella Reichert, MD 12/13/17 1144

## 2017-12-12 NOTE — ED Provider Notes (Signed)
Patient placed in Quick Look pathway, seen and evaluated   Chief Complaint: abdominal pain  HPI:   Pt reports pain left lower abdomen   ROS: nausea  Physical Exam:   Gen: No distress  Neuro: Awake and Alert  Skin: Warm    Focused Exam: tender left lower quadrant   Initiation of care has begun. The patient has been counseled on the process, plan, and necessity for staying for the completion/evaluation, and the remainder of the medical screening examination   Sidney Ace 12/12/17 1652    Valarie Merino, MD 12/12/17 2351

## 2017-12-12 NOTE — Discharge Instructions (Addendum)
Please read attached information. If you experience any new or worsening signs or symptoms please return to the emergency room for evaluation. Please follow-up with your primary care provider or specialist as discussed. Please use medication prescribed only as directed and discontinue taking if you have any concerning signs or symptoms.   °

## 2017-12-12 NOTE — Telephone Encounter (Signed)
Patient called requesting an appointment to be seen after visiting ED on 6/15.Patient states she was seen at ED and was informed she had a ruptured hernia and to follow up with our office. However patient owes a bill and is aware of the amount but can only pay in person. Patient stated she could come in at 2:30 today and pay and asked if she could be seen today. I informed the patient I could not guarantee there would be an appointment available at that time but that she could call right before coming in to let them know she is on the way and see if there is an opening. Patient stated she would do that. Patient was also informed to go back to the ED if symptoms got worse. Patient verbalized understanding

## 2017-12-20 DIAGNOSIS — R1032 Left lower quadrant pain: Secondary | ICD-10-CM | POA: Diagnosis not present

## 2017-12-28 ENCOUNTER — Encounter (INDEPENDENT_AMBULATORY_CARE_PROVIDER_SITE_OTHER): Payer: Self-pay | Admitting: Orthopaedic Surgery

## 2017-12-28 ENCOUNTER — Ambulatory Visit (INDEPENDENT_AMBULATORY_CARE_PROVIDER_SITE_OTHER): Payer: Medicaid Other | Admitting: Orthopaedic Surgery

## 2017-12-28 ENCOUNTER — Ambulatory Visit (INDEPENDENT_AMBULATORY_CARE_PROVIDER_SITE_OTHER): Payer: Medicaid Other

## 2017-12-28 ENCOUNTER — Other Ambulatory Visit: Payer: Self-pay | Admitting: Physician Assistant

## 2017-12-28 DIAGNOSIS — M25552 Pain in left hip: Secondary | ICD-10-CM

## 2017-12-28 DIAGNOSIS — M542 Cervicalgia: Secondary | ICD-10-CM

## 2017-12-28 MED ORDER — PREDNISONE 10 MG (21) PO TBPK: ORAL_TABLET | ORAL | 0 refills | Status: DC

## 2017-12-28 NOTE — Progress Notes (Signed)
Office Visit Note   Patient: Susan Gross           Date of Birth: 01/28/1981           MRN: [id number] Visit Date: 12/28/2017              Requested by: Orlena Sheldon, PA-C [address] [place], [place] [address] PCP: Orlena Sheldon, PA-C   Assessment & Plan: Visit Diagnoses:  1. Pain in left hip     Plan: Impression is lumbar radiculopathy versus left hip impingement syndrome.  At this point, we will call in a Sterapred taper.  Hopefully this will help settle things down.  She will follow-up with [place] in 2 weeks time for recheck.  At that point we may refer her to Dr. Ernestina Patches for diagnostic and therapeutic intra-articular cortisone injection to the left hip.  She will call with concerns or questions in the meantime.  Follow-Up Instructions: Return in about 2 weeks (around 01/11/2018).   Orders:  Orders Placed This Encounter  Procedures  . XR HIP UNILAT W OR W/O PELVIS 2-3 VIEWS LEFT  . XR Lumbar Spine 2-3 Views   Meds ordered this encounter  Medications  . predniSONE (STERAPRED UNI-PAK 21 TAB) 10 MG (21) TBPK tablet    Sig: Take as directed    Dispense:  21 tablet    Refill:  0      Procedures: No procedures performed   Clinical Data: No additional findings.   Subjective: Chief Complaint  Patient presents with  . Left Hip - Pain    HPI patient is a pleasant 37 year old female who presents to our clinic today with left lower back pain as well as left groin pain.  This is been ongoing for the past several years but has dramatically worsened over the past couple months.  She does note numbness, tingling and burning to the she does note pain from her groin to the top of her thigh as well as her left buttocks radiating down the back of her leg.  Pain is worse sitting on the left side, walking, lifting as well as standing for long periods of time.  She was seen by her primary care provider where she was given tramadol, Flexeril and ibuprofen.  These have minimally  helped.  Mid thigh as well as into her toes.  No saddle paresthesias, bowel or bladder incontinence.  Review of Systems as detailed in HPI.  All others reviewed and are negative.   Objective: Vital Signs: There were no vitals taken for this visit.  Physical Exam well-developed well-nourished female in no acute distress.  Alert and oriented x3.  Ortho Exam examination of her lower back reveals increased pain with lumbar flexion, rotation, markedly positive straight leg raise, positive logroll and Fater.  No focal weakness.  Specialty Comments:  No specialty comments available.  Imaging: Xr Hip Unilat W Or W/o Pelvis 2-3 Views Left  Result Date: 12/28/2017 Small spur off the lateral acetabulum likely resulting in a pincer impingement  Xr Lumbar Spine 2-3 Views  Result Date: 12/28/2017 No acute or structural abnormalities    PMFS History: Patient Active Problem List   Diagnosis Date Noted  . Pain in left hip 12/28/2017  . Encounter for postoperative wound check 08/19/2016  . Postoperative pelvic peritoneal adhesions 08/10/2016  . Cyst of left ovary 07/12/2016  . Major depressive disorder, recurrent episode, moderate degree ([place]) 01/26/2016  . Major depressive disorder, recurrent episode, moderate ([place])  01/26/2016  . Interstitial cystitis   . Chronic back pain   . Chronic pelvic pain in female   . ANXIETY 02/21/2007  . DEPRESSION 02/21/2007  . DYSLEXIA 02/21/2007   Past Medical History:  Diagnosis Date  . Anemia   . Anxiety   . Cancer (HCC)    cervical  . Chronic back pain   . Chronic pelvic pain in female   . Depression   . Endometriosis   . Fibromyalgia   . Headache(784.0)   . History of kidney stones   . Interstitial cystitis   . Ovarian cyst   . Suicide attempt [facility])     Family History  Problem Relation Age of Onset  . Hypertension Mother   . Hypertension Father   . Heart attack Father   . Muscular dystrophy Brother   . Cancer Paternal Grandfather   .  Other Paternal Grandmother        bleeding ulcers  . Irritable bowel syndrome Paternal Grandmother   . Fibromyalgia Maternal Grandmother   . Cancer Maternal Grandfather   . Bipolar disorder Sister   . Bipolar disorder Sister     Past Surgical History:  Procedure Laterality Date  . ABDOMINAL HYSTERECTOMY    . CESAREAN SECTION    . HERNIA REPAIR Left    LIH  . LAPAROSCOPIC SALPINGO OOPHERECTOMY Left 08/10/2016   Procedure: LAPAROSCOPIC SALPINGO OOPHORECTOMY;  Surgeon: Jonnie Kind, MD;  Location: [facility];  Service: Gynecology;  Laterality: Left;  possible open procedure   Social History   Occupational History  . Not on file  Tobacco Use  . Smoking status: Current Every Day Smoker    Packs/day: 1.00    Years: 15.00    Pack years: 15.00    Types: Cigarettes  . Smokeless tobacco: Never Used  Substance and Sexual Activity  . Alcohol use: No  . Drug use: Yes    Types: Marijuana    Comment: hx  . Sexual activity: Not Currently    Partners: Male    Birth control/protection: Surgical    Comment: Hysterectomy

## 2017-12-28 NOTE — Telephone Encounter (Signed)
Refill on tramadol and flexeril to [facility] [place].

## 2018-01-02 MED ORDER — CYCLOBENZAPRINE HCL 10 MG PO TABS: 10.0000 mg | ORAL_TABLET | Freq: Three times a day (TID) | ORAL | 1 refills | Status: DC | PRN

## 2018-01-02 MED ORDER — TRAMADOL HCL 50 MG PO TABS: 50.0000 mg | ORAL_TABLET | Freq: Four times a day (QID) | ORAL | 0 refills | Status: DC | PRN

## 2018-01-02 NOTE — Telephone Encounter (Signed)
Last OV 08/22/2017 Last refill  Tramadol  12/12/2017 Flexeril 06/30/2017  Ok to refill?

## 2018-01-11 ENCOUNTER — Ambulatory Visit (INDEPENDENT_AMBULATORY_CARE_PROVIDER_SITE_OTHER): Payer: Medicaid Other | Admitting: Orthopaedic Surgery

## 2018-01-11 ENCOUNTER — Other Ambulatory Visit (INDEPENDENT_AMBULATORY_CARE_PROVIDER_SITE_OTHER): Payer: Self-pay

## 2018-01-11 ENCOUNTER — Encounter (INDEPENDENT_AMBULATORY_CARE_PROVIDER_SITE_OTHER): Payer: Self-pay | Admitting: Orthopaedic Surgery

## 2018-01-11 DIAGNOSIS — M545 Low back pain, unspecified: Secondary | ICD-10-CM | POA: Insufficient documentation

## 2018-01-11 DIAGNOSIS — G8929 Other chronic pain: Secondary | ICD-10-CM

## 2018-01-11 NOTE — Progress Notes (Signed)
Office Visit Note   Patient: Susan Gross           Date of Birth: [birth year]           MRN: [id number] Visit Date: 01/11/2018              Requested by: Orlena Sheldon, PA-C [address], [place] [address] PCP: Orlena Sheldon, PA-C   Assessment & Plan: Visit Diagnoses:  1. Lumbar pain     Plan: Impression is lumbar radiculopathy left lower extremity.  At this point, we will obtain an MRI as her symptoms have become more significant.  She will follow-up with Dr. Ernestina Patches once these are completed for a likely epidural steroid injection.  In the meantime, I have given her samples of Duexis.  Follow-Up Instructions: Return for MRI review and possible ESI with Dr. Ernestina Patches.   Orders:  No orders of the defined types were placed in this encounter.  No orders of the defined types were placed in this encounter.     Procedures: No procedures performed   Clinical Data: No additional findings.   Subjective: Chief Complaint  Patient presents with  . Left Hip - Pain  . Lower Back - Pain    HPI patient is a pleasant 37 year old female who presents to our clinic today for follow-up of her left leg pain and weakness. This has been ongoing for the past several years and has recently dramatically worsened.  He started her on a Sterapred taper as well as a muscle relaxer a few weeks back which was of slight relief.  She has recently started getting more weakness to the left lower extremity.  She also notes burning to the entire left leg.  No bowel or bladder change and no saddle paresthesias.  Review of Systems as detailed in HPI.  All others reviewed and are negative.   Objective: Vital Signs: There were no vitals taken for this visit.  Physical Exam well developed well-nourished female in no acute distress.  Alert and oriented x3.  Ortho Exam examination of her lumbar spine reveals increased pain with lumbar flexion.  She is unable to comfortably sit on her left  buttocks.  She has marked weakness to the left lower extremity with a positive straight leg raise.  Specialty Comments:  No specialty comments available.  Imaging: No new imaging   PMFS History: Patient Active Problem List   Diagnosis Date Noted  . Lumbar pain 01/11/2018  . Pain in left hip 12/28/2017  . Encounter for postoperative wound check 08/19/2016  . Postoperative pelvic peritoneal adhesions 08/10/2016  . Cyst of left ovary 07/12/2016  . Major depressive disorder, recurrent episode, moderate degree ([place]) 01/26/2016  . Major depressive disorder, recurrent episode, moderate ([place]) 01/26/2016  . Interstitial cystitis   . Chronic back pain   . Chronic pelvic pain in female   . ANXIETY 02/21/2007  . DEPRESSION 02/21/2007  . DYSLEXIA 02/21/2007   Past Medical History:  Diagnosis Date  . Anemia   . Anxiety   . Cancer (HCC)    cervical  . Chronic back pain   . Chronic pelvic pain in female   . Depression   . Endometriosis   . Fibromyalgia   . Headache(784.0)   . History of kidney stones   . Interstitial cystitis   . Ovarian cyst   . Suicide attempt [facility])     Family History  Problem Relation Age of Onset  . Hypertension Mother   .  Hypertension Father   . Heart attack Father   . Muscular dystrophy Brother   . Cancer Paternal Grandfather   . Other Paternal Grandmother        bleeding ulcers  . Irritable bowel syndrome Paternal Grandmother   . Fibromyalgia Maternal Grandmother   . Cancer Maternal Grandfather   . Bipolar disorder Sister   . Bipolar disorder Sister     Past Surgical History:  Procedure Laterality Date  . ABDOMINAL HYSTERECTOMY    . CESAREAN SECTION    . HERNIA REPAIR Left    LIH  . LAPAROSCOPIC SALPINGO OOPHERECTOMY Left 08/10/2016   Procedure: LAPAROSCOPIC SALPINGO OOPHORECTOMY;  Surgeon: Jonnie Kind, MD;  Location: [facility];  Service: Gynecology;  Laterality: Left;  possible open procedure   Social History   Occupational History  . Not  on file  Tobacco Use  . Smoking status: Current Every Day Smoker    Packs/day: 1.00    Years: 15.00    Pack years: 15.00    Types: Cigarettes  . Smokeless tobacco: Never Used  Substance and Sexual Activity  . Alcohol use: No  . Drug use: Yes    Types: Marijuana    Comment: hx  . Sexual activity: Not Currently    Partners: Male    Birth control/protection: Surgical    Comment: Hysterectomy

## 2018-08-24 IMAGING — CT CT RENAL STONE PROTOCOL
2 of 4 series · 15 of 46 positions shown, 17 images · non-contrast
Comparison: Contrast-enhanced CT 7327

CLINICAL DATA: Left flank pain, onset yesterday.

EXAM:
CT ABDOMEN AND PELVIS WITHOUT CONTRAST
TECHNIQUE: Multidetector CT imaging of the abdomen and pelvis was performed
following the standard protocol without IV contrast.

[Series 2: axial st · axial · 0.60mm/px · z∈[-392,-47]mm · 12 of 80 slices shown, 14 images]
[im 7/80  soft-tissue]
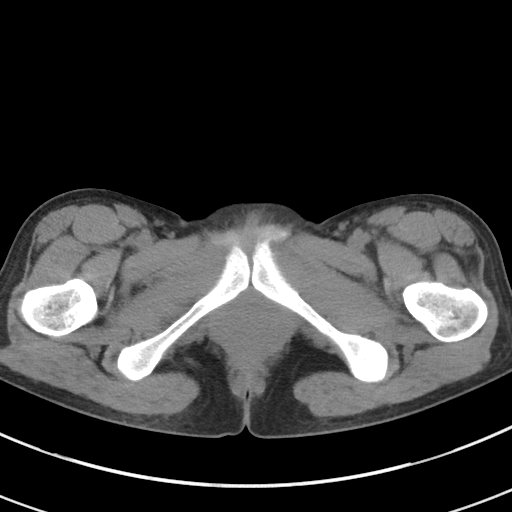
[im 7/80  bone]
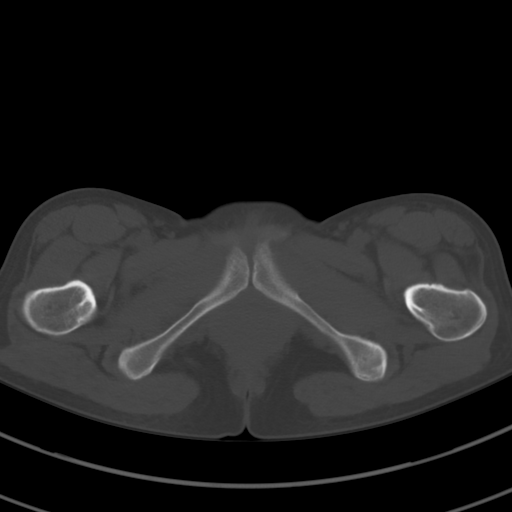
[im 13/80  soft-tissue]
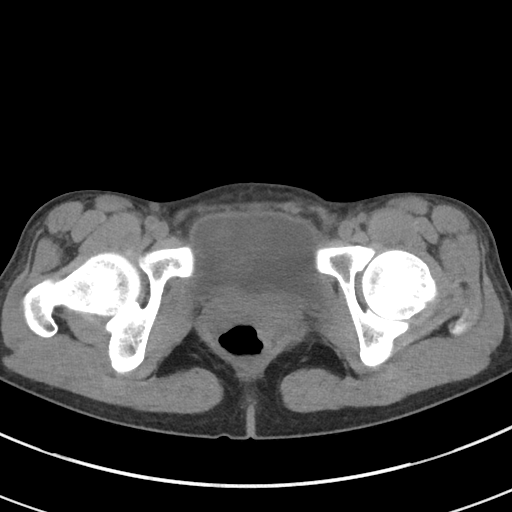
[im 19/80  soft-tissue]
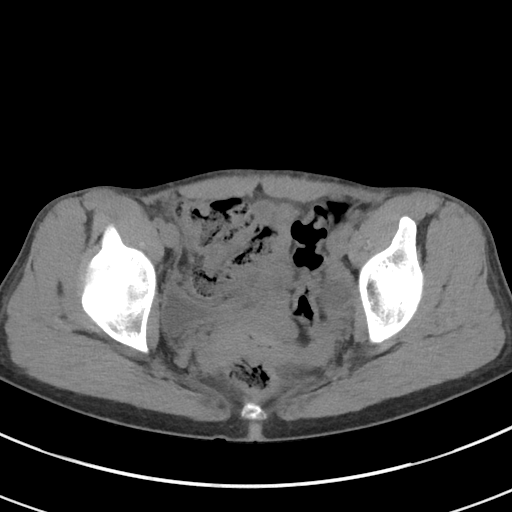
[im 26/80  soft-tissue]
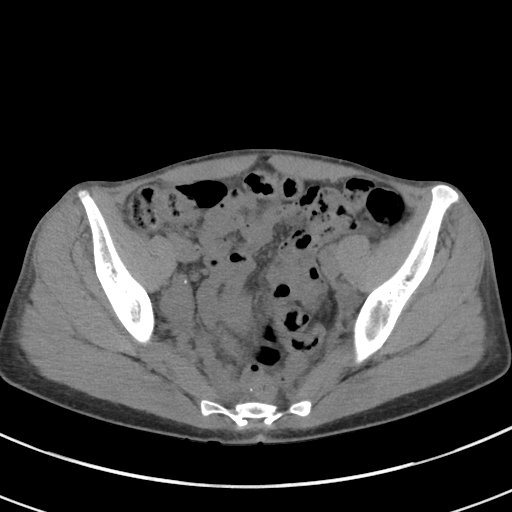
[im 32/80  soft-tissue]
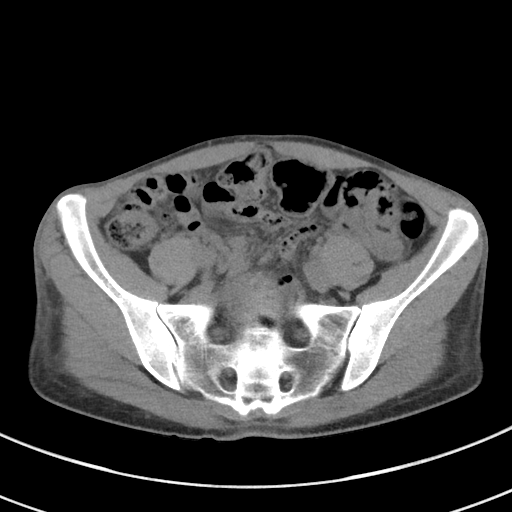
[im 38/80  soft-tissue]
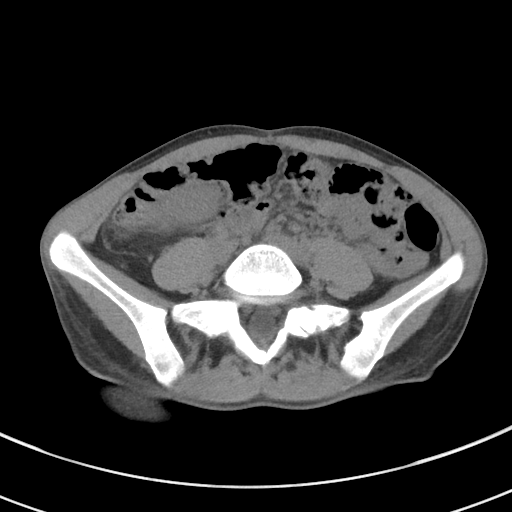
[im 45/80  soft-tissue]
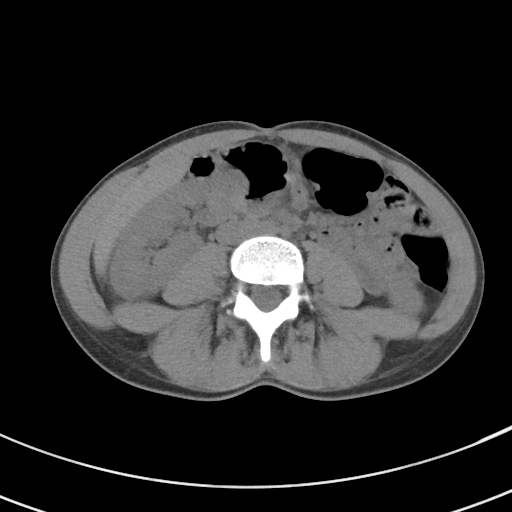
[im 51/80  soft-tissue]
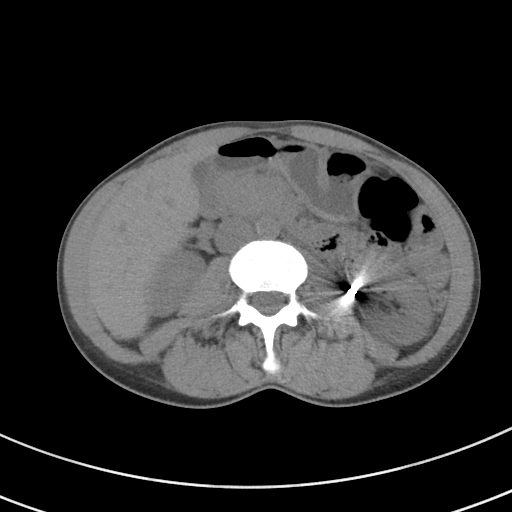
[im 57/80  soft-tissue]
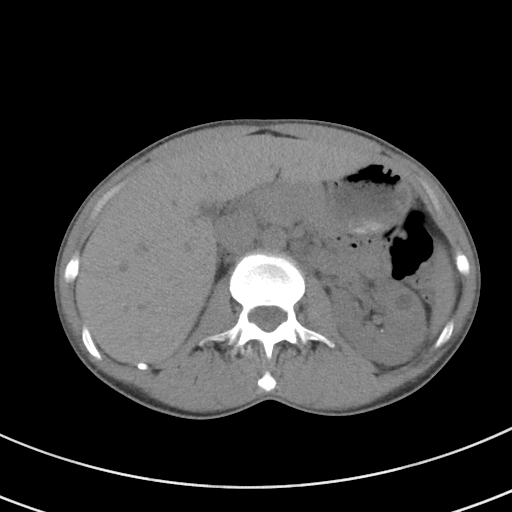
[im 57/80  bone]
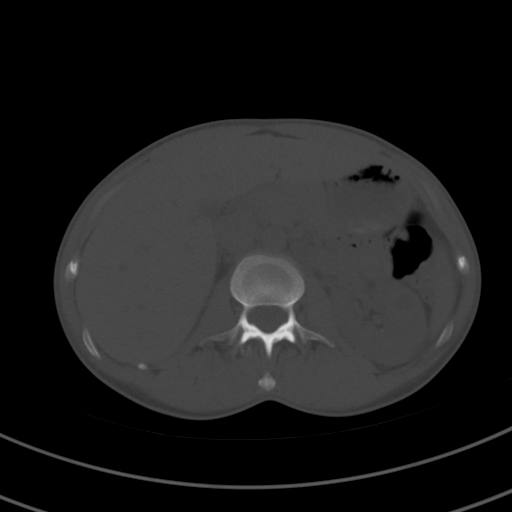
[im 64/80  soft-tissue]
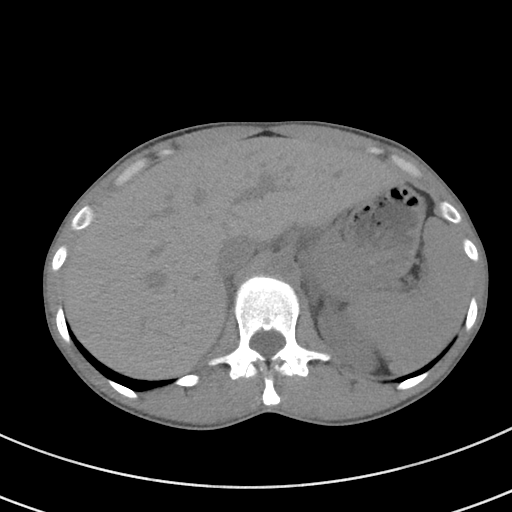
[im 70/80  soft-tissue]
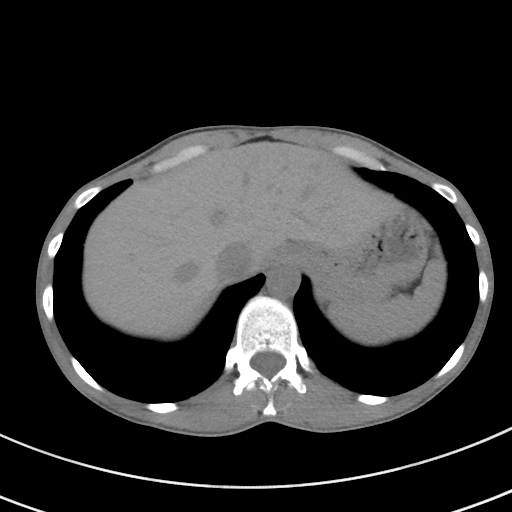
[im 76/80  soft-tissue]
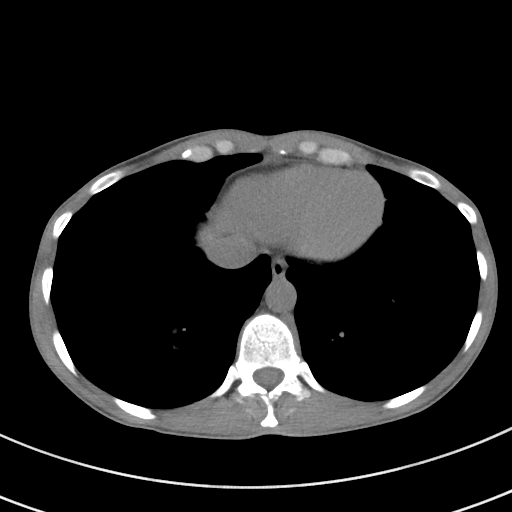

[Series 4: coronal st · coronal · 0.55mm/px · 3 of 59 slices shown]
[im 20/59  soft-tissue]
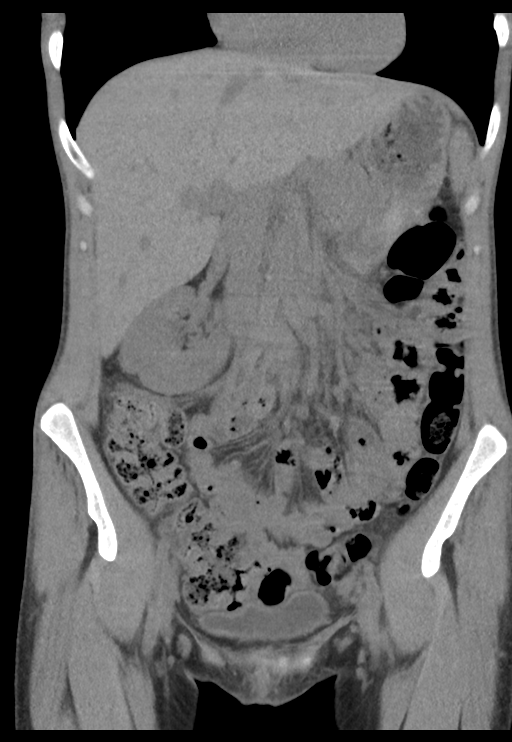
[im 26/59  soft-tissue]
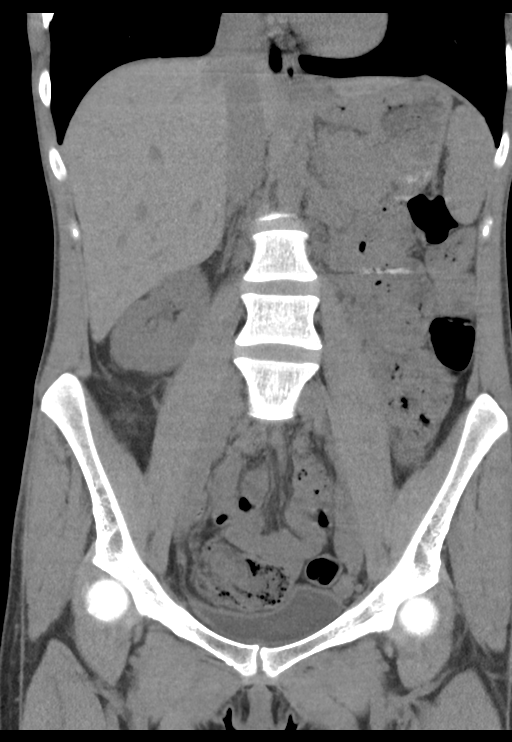
[im 33/59  soft-tissue]
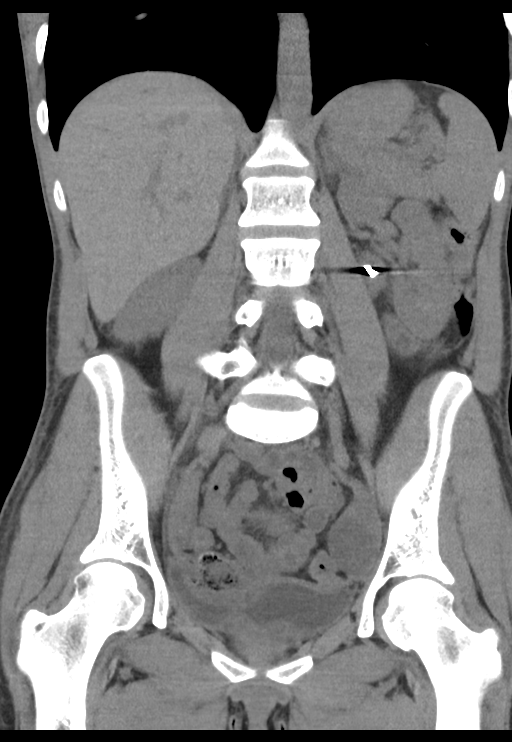

[15 of 46 positions shown; findings below may reference images not displayed]

FINDINGS: Lower chest: No lung bases are clear.

Hepatobiliary: No focal hepatic lesion allowing for noncontrast
exam. Gallbladder is decompressed without calcified stone. No
biliary dilatation.

Pancreas: Not well evaluated due to lack of contrast and paucity of
intra- abdominal fat. No ductal dilatation or inflammation.

Spleen: Normal in size without focal abnormality.

Adrenals/Urinary Tract: Punctate bilateral nonobstructing
nephrolithiasis. No hydronephrosis or perinephric edema. Simple
cysts in the mid left kidney, grossly unchanged from prior. Metallic
densities in the left retroperitoneum at the level of of the left
renal pelvis were not present on prior exam, and may be surgical
clips. No ureteral calculi, ureters not well-defined. Bilateral
pelvic phleboliths are again seen. Urinary bladder is minimally
distended, no bladder stone.

Stomach/Bowel: Stomach is physiologically distended. No evidence of
bowel inflammation or distention. Evaluation limited by lack of
contrast and paucity of intra-abdominal fat. Cecum low lying in the
pelvis, the appendix is tentatively but not confidently identified.
No pericecal inflammation.

Vascular/Lymphatic: No significant vascular findings are present. No
enlarged abdominal or pelvic lymph nodes.

Reproductive: Post hysterectomy. There is a 2.9 cm cyst in the left
ovary. Right ovary not confidently visualized.

Other: No abdominal wall hernia or abnormality. No abdominopelvic
ascites.

Musculoskeletal: There are no acute or suspicious osseous
abnormalities.
IMPRESSION: 1. Bilateral nonobstructing renal calculi. No hydronephrosis or
obstructive uropathy.
2. Left ovarian cyst measures 2.9 cm.

## 2018-08-30 ENCOUNTER — Ambulatory Visit (INDEPENDENT_AMBULATORY_CARE_PROVIDER_SITE_OTHER): Payer: Medicaid Other | Admitting: Adult Health

## 2018-08-30 ENCOUNTER — Encounter: Payer: Self-pay | Admitting: Adult Health

## 2018-08-30 VITALS — BP 140/92 | HR 117 | Ht 62.0 in | Wt 100.0 lb

## 2018-08-30 DIAGNOSIS — B9689 Other specified bacterial agents as the cause of diseases classified elsewhere: Secondary | ICD-10-CM | POA: Diagnosis not present

## 2018-08-30 DIAGNOSIS — R140 Abdominal distension (gaseous): Secondary | ICD-10-CM | POA: Insufficient documentation

## 2018-08-30 DIAGNOSIS — R1032 Left lower quadrant pain: Secondary | ICD-10-CM | POA: Diagnosis not present

## 2018-08-30 DIAGNOSIS — Z9889 Other specified postprocedural states: Secondary | ICD-10-CM

## 2018-08-30 DIAGNOSIS — N898 Other specified noninflammatory disorders of vagina: Secondary | ICD-10-CM | POA: Insufficient documentation

## 2018-08-30 DIAGNOSIS — N760 Acute vaginitis: Secondary | ICD-10-CM | POA: Diagnosis not present

## 2018-08-30 DIAGNOSIS — Z8742 Personal history of other diseases of the female genital tract: Secondary | ICD-10-CM | POA: Diagnosis not present

## 2018-08-30 DIAGNOSIS — Z8719 Personal history of other diseases of the digestive system: Secondary | ICD-10-CM | POA: Diagnosis not present

## 2018-08-30 LAB — POCT WET PREP (WET MOUNT)
Clue Cells Wet Prep Whiff POC: POSITIVE
WBC WET PREP: POSITIVE

## 2018-08-30 MED ORDER — KETOROLAC TROMETHAMINE 10 MG PO TABS: 10.0000 mg | ORAL_TABLET | Freq: Four times a day (QID) | ORAL | 0 refills | Status: DC | PRN

## 2018-08-30 MED ORDER — METRONIDAZOLE 500 MG PO TABS: 500.0000 mg | ORAL_TABLET | Freq: Three times a day (TID) | ORAL | 0 refills | Status: DC

## 2018-08-30 NOTE — Progress Notes (Signed)
Patient ID: Susan Gross, female   DOB: May 03, 1981, 38 y.o.   MRN: [id number] History of Present Illness:  Susan Gross is a 38 year old white female, divorced, sp hysterectomy in complaining of LLQ pain, that radiates down left inner leg and leg feels numb if sits too long, has constipation at times and stomach feels bloated. Feels like when had endometriosis in past.  PCP is D Pickard.   Current Medications, Allergies, Past Medical History, Past Surgical History, Family History and Social History were reviewed in [employer] record.     Review of Systems: Pain in LLQ, that radiates down inner left leg +constipation, unless takes something +bloating Left left feels numb if sits too long She says she can't always hold her urine  Last sex 2 weeks ago, does not feel like it now  Physical Exam:BP (!) 140/92 (BP Location: Left Arm, Patient Position: Sitting, Cuff Size: Normal)   Pulse (!) 117   Ht 5\' 2"  (1.575 m)   Wt 100 lb (45.4 kg)   BMI 18.29 kg/m  General:  Well developed, well nourished, no acute distress, but looks like she feels bad  Skin:  Warm and dry Pelvic:  External genitalia is normal in appearance, no lesions.  The vagina is normal in appearance, with white frothy discharge with odor, slightly tender. Urethra has no lesions or masses. The cervix and uterus are absent.  No adnexal masses, +LLQ  tenderness noted.Bladder is non tender, no masses felt.No lymph nodes felt, tender over hernia repair site.Wet prep was +WBC and clue cells, Nuswab obtained.  Spine it tender globally  Extremities/musculoskeletal:  No swelling or varicosities noted, no clubbing or cyanosis Psych:  No mood changes, alert and cooperative  Fall risk is low.  Will get [place] to assess LLQ, and if negative, may need MRI of spine.  Discussed with her could be ovary related or even her back. Examination chaperoned by Levy Pupa LPN.  She says she has had BV before.  Face time 15 minutes  with 50% counseling and coordinating care.   Impression: 1. LLQ pain   2. Vaginal discharge   3. BV (bacterial vaginosis)   4. Bloating   5. History of hernia repair   6. History of endometriosis       Plan: Nuswab sent Meds ordered this encounter  Medications  . ketorolac (TORADOL) 10 MG tablet    Sig: Take 1 tablet (10 mg total) by mouth every 6 (six) hours as needed.    Dispense:  20 tablet    Refill:  0    Order Specific Question:   Supervising Provider    Answer:   Elonda Husky, LUTHER H [[id number]]  . metroNIDAZOLE (FLAGYL) 500 MG tablet    Sig: Take 1 tablet (500 mg total) by mouth 3 (three) times daily.    Dispense:  21 tablet    Refill:  0    Order Specific Question:   Supervising Provider    Answer:   Tania Ade H [[id number]]   Return in 1 week for GYN [place], will talk when results back and proceed from there

## 2018-09-02 LAB — NUSWAB VAGINITIS PLUS (VG+)
Atopobium vaginae: HIGH Score — AB
BVAB 2: HIGH Score — AB
CANDIDA ALBICANS, NAA: NEGATIVE
CANDIDA GLABRATA, NAA: POSITIVE — AB
CHLAMYDIA TRACHOMATIS, NAA: NEGATIVE
MEGASPHAERA 1: HIGH {score} — AB
NEISSERIA GONORRHOEAE, NAA: NEGATIVE
TRICH VAG BY NAA: NEGATIVE

## 2018-09-06 ENCOUNTER — Ambulatory Visit: Payer: Medicaid Other

## 2018-09-06 ENCOUNTER — Other Ambulatory Visit: Payer: Self-pay

## 2018-09-06 DIAGNOSIS — R1032 Left lower quadrant pain: Secondary | ICD-10-CM

## 2018-09-06 DIAGNOSIS — R140 Abdominal distension (gaseous): Secondary | ICD-10-CM | POA: Diagnosis not present

## 2018-09-06 NOTE — Progress Notes (Signed)
PELVIC US TA/TV: normal vaginal cuff,normal right ovary,left oophorectomy,? remnant of left ovary vs bowel or scar tissue 1.2 x .5 x .8 cm,simple cul de sac fluid,ovaries appear mobile,left adnexal discomfort during ultrasound

## 2018-09-11 ENCOUNTER — Telehealth: Payer: Self-pay | Admitting: Adult Health

## 2018-09-11 NOTE — Telephone Encounter (Signed)
Left message that US showed normal vaginal cuf and right ovary and may have left ovary remnant or endometrioma, if still having pain, may want to make appt with MD to discuss options,

## 2019-01-04 ENCOUNTER — Ambulatory Visit (INDEPENDENT_AMBULATORY_CARE_PROVIDER_SITE_OTHER): Payer: Medicaid Other | Admitting: Family Medicine

## 2019-01-04 ENCOUNTER — Other Ambulatory Visit: Payer: Self-pay

## 2019-01-04 ENCOUNTER — Encounter: Payer: Self-pay | Admitting: Family Medicine

## 2019-01-04 VITALS — BP 138/82 | HR 100 | Temp 99.0°F | Resp 16 | Ht 62.0 in | Wt 98.0 lb

## 2019-01-04 DIAGNOSIS — G8929 Other chronic pain: Secondary | ICD-10-CM

## 2019-01-04 DIAGNOSIS — R1013 Epigastric pain: Secondary | ICD-10-CM

## 2019-01-04 DIAGNOSIS — N3010 Interstitial cystitis (chronic) without hematuria: Secondary | ICD-10-CM | POA: Diagnosis not present

## 2019-01-04 DIAGNOSIS — R300 Dysuria: Secondary | ICD-10-CM | POA: Diagnosis not present

## 2019-01-04 DIAGNOSIS — R110 Nausea: Secondary | ICD-10-CM | POA: Diagnosis not present

## 2019-01-04 DIAGNOSIS — M549 Dorsalgia, unspecified: Secondary | ICD-10-CM

## 2019-01-04 DIAGNOSIS — F1290 Cannabis use, unspecified, uncomplicated: Secondary | ICD-10-CM

## 2019-01-04 LAB — URINALYSIS, ROUTINE W REFLEX MICROSCOPIC
Bilirubin Urine: NEGATIVE
Glucose, UA: NEGATIVE
Hyaline Cast: NONE SEEN /LPF
Ketones, ur: NEGATIVE
Leukocytes,Ua: NEGATIVE
Nitrite: NEGATIVE
Protein, ur: NEGATIVE
Specific Gravity, Urine: 1.02 (ref 1.001–1.03)
pH: 7 (ref 5.0–8.0)

## 2019-01-04 LAB — MICROSCOPIC MESSAGE

## 2019-01-04 MED ORDER — ONDANSETRON 4 MG PO TBDP: 4.0000 mg | ORAL_TABLET | Freq: Three times a day (TID) | ORAL | 0 refills | Status: DC | PRN

## 2019-01-04 MED ORDER — CEPHALEXIN 500 MG PO CAPS: 500.0000 mg | ORAL_CAPSULE | Freq: Four times a day (QID) | ORAL | 0 refills | Status: AC

## 2019-01-04 MED ORDER — MIRABEGRON ER 25 MG PO TB24: 25.0000 mg | ORAL_TABLET | Freq: Every day | ORAL | 5 refills | Status: DC

## 2019-01-04 MED ORDER — CEFTRIAXONE SODIUM 1 G IJ SOLR
1.0000 g | Freq: Once | INTRAMUSCULAR | Status: AC
  Administered 2019-01-04: 1 g via INTRAMUSCULAR

## 2019-01-04 MED ORDER — CEFTRIAXONE SODIUM 1 G IJ SOLR: 1.0000 g | Freq: Once | INTRAMUSCULAR | Status: DC

## 2019-01-04 MED ORDER — CYCLOBENZAPRINE HCL 5 MG PO TABS: 5.0000 mg | ORAL_TABLET | Freq: Three times a day (TID) | ORAL | 2 refills | Status: DC | PRN

## 2019-01-04 MED ORDER — PHENAZOPYRIDINE HCL 200 MG PO TABS
200.0000 mg | ORAL_TABLET | Freq: Three times a day (TID) | ORAL | 0 refills | Status: DC | PRN
Start: 2019-01-04 — End: 2020-05-05

## 2019-01-04 NOTE — Progress Notes (Signed)
Patient ID: Susan Gross, female    DOB: 1981/02/23, 38 y.o.   MRN: [id number]  PCP: Susy Frizzle, MD  Chief Complaint  Patient presents with  . Dysuria    x10 days- burning with urintation- has hx of kidney stones    Subjective:   Susan Gross is a 38 y.o. female, presents to clinic with CC of dysuria and back pain x several months but much more severe in the past 10 d. Dysuria, urinary frequency, urgency, decreased urine.  Also has back pain that radiates down into both legs and is worse with movement.  She has some associated nausea and loss of appetite and few episodes of vomiting over the past 1-2 weeks.  None today, but she's not eaten much, and is not drinking very much liquids at all.  Urinating frequently, small amounts dark, n hematuria, nothing that looks like coca-cola.   Her back pain is fairly constant, located across the mid to low back, worse with movement.  Shes tx with marijuana and taking ibuprofen at least 3x a day for several weeks.  She has developed some upper stomach discomfort.  Denies melena, hematochezia or pain with eating.   Hx of interstitial cystitis, pelvic pain, s/p partial hysterectomy, kidney stones.  She has stopped all her meds and stopped going to urology.  Has been very depressed and dealing with the loss of her grandmother who raised her.  Smoking marijuana more to deal with her chronic sx, pain, and moods.   Urinary sx have been worse for the past 10 days, she stopped going to urology more than a year ago and feels ready to get back on bladder meds again, she can't remember what she's taken in the past.  Denies fever, chills, sweats, vaginal discharge, genital rash or lesions.     Patient Active Problem List   Diagnosis Date Noted  . LLQ pain 08/30/2018  . Vaginal discharge 08/30/2018  . BV (bacterial vaginosis) 08/30/2018  . Bloating 08/30/2018  . History of hernia repair 08/30/2018  . History of endometriosis 08/30/2018  . Lumbar  pain 01/11/2018  . Pain in left hip 12/28/2017  . Encounter for postoperative wound check 08/19/2016  . Postoperative pelvic peritoneal adhesions 08/10/2016  . Cyst of left ovary 07/12/2016  . Major depressive disorder, recurrent episode, moderate degree ([place]) 01/26/2016  . Major depressive disorder, recurrent episode, moderate ([place]) 01/26/2016  . Interstitial cystitis   . Chronic back pain   . Chronic pelvic pain in female   . ANXIETY 02/21/2007  . DEPRESSION 02/21/2007  . DYSLEXIA 02/21/2007     Prior to Admission medications   Medication Sig Start Date End Date Taking? Authorizing Provider  Carboxymethylcellulose Sodium (MOISTURIZING LUBRICANT EYE OP) Place 1-2 drops into both eyes 3 (three) times daily as needed (for dry eyes).   Yes [provider]  ibuprofen (ADVIL,MOTRIN) 200 MG tablet Take 800 mg by mouth every 4 (four) hours as needed.   Yes [provider]  ketorolac (TORADOL) 10 MG tablet Take 1 tablet (10 mg total) by mouth every 6 (six) hours as needed. Patient not taking: Reported on 01/04/2019 08/30/18   Estill Dooms, NP     Allergies  Allergen Reactions  . Ciprofloxacin Nausea And Vomiting  . Hydrocodone Hives     Family History  Problem Relation Age of Onset  . Hypertension Mother   . Hypertension Father   . Heart attack Father   . Muscular dystrophy Brother   .  Cancer Paternal Grandfather   . Other Paternal Grandmother        bleeding ulcers  . Irritable bowel syndrome Paternal Grandmother   . Fibromyalgia Maternal Grandmother   . Cancer Maternal Grandfather   . Bipolar disorder Sister   . Bipolar disorder Sister      Social History   Socioeconomic History  . Marital status: Divorced    Spouse name: Not on file  . Number of children: Not on file  . Years of education: Not on file  . Highest education level: Not on file  Occupational History  . Not on file  Social Needs  . Financial resource strain: Not on file  . Food  insecurity    Worry: Not on file    Inability: Not on file  . Transportation needs    Medical: Not on file    Non-medical: Not on file  Tobacco Use  . Smoking status: Current Every Day Smoker    Packs/day: 1.00    Years: 15.00    Pack years: 15.00    Types: Cigarettes  . Smokeless tobacco: Never Used  Substance and Sexual Activity  . Alcohol use: No  . Drug use: Yes    Types: Marijuana    Comment: once a day  . Sexual activity: Yes    Partners: Male    Birth control/protection: Surgical    Comment: Hysterectomy  Lifestyle  . Physical activity    Days per week: Not on file    Minutes per session: Not on file  . Stress: Not on file  Relationships  . Social Herbalist on phone: Not on file    Gets together: Not on file    Attends religious service: Not on file    Active member of club or organization: Not on file    Attends meetings of clubs or organizations: Not on file    Relationship status: Not on file  . Intimate partner violence    Fear of current or ex partner: Not on file    Emotionally abused: Not on file    Physically abused: Not on file    Forced sexual activity: Not on file  Other Topics Concern  . Not on file  Social History Narrative  . Not on file     Review of Systems  Constitutional: Negative.   HENT: Negative.   Eyes: Negative.   Respiratory: Negative.   Cardiovascular: Negative.   Gastrointestinal: Negative.   Endocrine: Negative.   Genitourinary: Negative.   Musculoskeletal: Negative.   Skin: Negative.   Allergic/Immunologic: Negative.   Neurological: Negative.   Hematological: Negative.   Psychiatric/Behavioral: Negative.   All other systems reviewed and are negative.      Objective:    Vitals:   01/04/19 1159  BP: 138/82  Pulse: 100  Resp: 16  Temp: 99 F (37.2 C)  TempSrc: Oral  SpO2: 97%  Weight: 98 lb (44.5 kg)  Height: 5\' 2"  (1.575 m)      Physical Exam Vitals signs and nursing note reviewed.   Constitutional:      General: She is not in acute distress.    Appearance: She is well-developed and underweight. She is not toxic-appearing or diaphoretic.     Comments: Appears   HENT:     Head: Normocephalic and atraumatic.     Right Ear: External ear normal.     Left Ear: External ear normal.     Nose: Nose normal. No congestion or rhinorrhea.  Mouth/Throat:     Mouth: Mucous membranes are dry.     Pharynx: Oropharynx is clear. No oropharyngeal exudate or posterior oropharyngeal erythema.  Eyes:     General:        Right eye: No discharge.        Left eye: No discharge.     Conjunctiva/sclera: Conjunctivae normal.  Neck:     Musculoskeletal: Normal range of motion.     Trachea: No tracheal deviation.  Cardiovascular:     Rate and Rhythm: Normal rate and regular rhythm.     Pulses: Normal pulses.     Heart sounds: No murmur. No friction rub. No gallop.   Pulmonary:     Effort: Pulmonary effort is normal. No respiratory distress.     Breath sounds: Normal breath sounds. No stridor. No wheezing, rhonchi or rales.  Abdominal:     General: Bowel sounds are normal. There is no distension.     Palpations: Abdomen is soft. There is no mass.     Tenderness: There is no abdominal tenderness. There is no right CVA tenderness, left CVA tenderness, guarding or rebound. Negative signs include Murphy's sign.     Hernia: No hernia is present.  Musculoskeletal: Normal range of motion.  Skin:    General: Skin is warm and dry.     Capillary Refill: Capillary refill takes less than 2 seconds.     Coloration: Skin is not jaundiced or pale.     Findings: No bruising or rash.  Neurological:     Mental Status: She is alert.     Motor: No abnormal muscle tone.     Coordination: Coordination normal.  Psychiatric:        Mood and Affect: Mood normal.        Behavior: Behavior normal. Behavior is cooperative.           Assessment & Plan:      ICD-10-CM   1. Dysuria  R30.0  Urinalysis, Routine w reflex microscopic    Urine Culture    Microscopic Message    cefTRIAXone (ROCEPHIN) injection 1 g    mirabegron ER (MYRBETRIQ) 25 MG TB24 tablet    cephALEXin (KEFLEX) 500 MG capsule    phenazopyridine (PYRIDIUM) 200 MG tablet    cefTRIAXone (ROCEPHIN) injection 1 g   UTI? vs OAB/interstitial cystitis or other bladder complications s/p pelvic surgeries - referred to uro/gyn or urology  2. Nausea  R11.0 ondansetron (ZOFRAN-ODT) 4 MG disintegrating tablet   may be secondary to UTI? or marijuana use?  zofran given so pt could take meds, would need to f/up immediately if not tolerating POs  3. Chronic back pain, unspecified back location, unspecified back pain laterality  M54.9 cyclobenzaprine (FLEXERIL) 5 MG tablet   G89.29    suspect MSK, taking too much NSAIDs, decrease amount, start PPI, rx for muscle relaxers, no red flags, f/up as needed  4. Interstitial cystitis  N30.10 mirabegron ER (MYRBETRIQ) 25 MG TB24 tablet    cefTRIAXone (ROCEPHIN) injection 1 g   f/up with urology  5. Epigastric pain  R10.13    decrease NSAID use, add PPI  6. Marijuana use  F12.90    almost daily, warned of hyperemesis syndrome, with abd pain, N, V - needs to d/c marijuana use if having sx    Dysuria with nausea some vomiting - will cover for UTI right now, but may be her interstitial cystitis since not currently treated for it.  Rocephin 1 g IM given in  clinic due to constitutional sx, complaint of abd pain, N, V.  Can start oral abx tonight, with antiemetic, encouraged to f/up if she cannot tolerate PO's.  Urinary analgesics and myrbetriq (if able to get) for dysuria and chronic urinary sx.  Back pain is w/o CVA tenderness b/l and abd exam benign so I doubt pyelonephritis or nephrolithiasis, suspect is muscle skeletal but she also has a history of kidney stones.  She reports taking a "bottle of ibuprofen a week" and having epigastric abdominal pain.  He was encouraged to decrease NSAID  use, use a PPI in the morning, lower dose Flexeril to use as needed, and will need to seek follow-up for her acute on chronic back pain.  Today no red flags, normal neuro exam, but evidence of slow careful gait.  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  Encouraged scheduling follow-up today so that she can continue to get back on her medications and address her chronic conditions.  She previously was seen by Karis Juba who left this practice last year but she still needs to establish and continue care with a different provider.   Delsa Grana, PA-C 01/04/19 12:18 PM

## 2019-01-04 NOTE — Patient Instructions (Signed)
Follow up if not improving. Go to the ER if not able to keep down meds or fluids Go to the ER if you have any severe sudden worsening of pain with fever, vomiting,   I will call you with the results of your urine culture.  It is very possible that your symptoms are from interstitial cystitis and not from infection and if that is the case the urine culture will be negative and you can stop antibiotics.  Take muscle relaxer and pyridium as needed.  I prescribed a bladder medicine - discuss with your pharmacy to find out coverage/cost etc, and call us if there is any problems getting.

## 2019-01-05 ENCOUNTER — Other Ambulatory Visit: Payer: Self-pay | Admitting: Family Medicine

## 2019-01-05 ENCOUNTER — Telehealth: Payer: Self-pay | Admitting: *Deleted

## 2019-01-05 MED ORDER — OXYBUTYNIN CHLORIDE ER 5 MG PO TB24: 5.0000 mg | ORAL_TABLET | Freq: Every day | ORAL | 1 refills | Status: DC

## 2019-01-05 NOTE — Telephone Encounter (Signed)
Received fax requesting alternative to Myrbetriq.   Insurance prefers Oxybutynin, [place], [place].   Please advise.

## 2019-01-05 NOTE — Telephone Encounter (Signed)
Oxybutynin sent in, but she did not tolerate the anticholinergics before, may need PA to get myrbetriq if still unable to tolerate

## 2019-01-06 LAB — URINE CULTURE
MICRO NUMBER:: 650297
SPECIMEN QUALITY:: ADEQUATE

## 2019-01-09 ENCOUNTER — Other Ambulatory Visit: Payer: Self-pay

## 2019-01-09 ENCOUNTER — Encounter: Payer: Self-pay | Admitting: Family Medicine

## 2019-01-09 ENCOUNTER — Ambulatory Visit: Payer: Medicaid Other | Admitting: Family Medicine

## 2019-01-09 VITALS — BP 128/84 | HR 100 | Temp 99.0°F | Resp 14 | Ht 61.5 in | Wt 99.0 lb

## 2019-01-09 DIAGNOSIS — N9489 Other specified conditions associated with female genital organs and menstrual cycle: Secondary | ICD-10-CM | POA: Diagnosis not present

## 2019-01-09 DIAGNOSIS — G5710 Meralgia paresthetica, unspecified lower limb: Secondary | ICD-10-CM

## 2019-01-09 MED ORDER — TRAMADOL HCL 50 MG PO TABS: 50.0000 mg | ORAL_TABLET | Freq: Three times a day (TID) | ORAL | 0 refills | Status: AC | PRN

## 2019-01-09 NOTE — Progress Notes (Signed)
Subjective:    Patient ID: Susan Gross, female    DOB: March 30, 1981, 38 y.o.   MRN: [id number]  HPI  Patient presents today after being referred from her gynecologist.  She went to her gynecologist due to left pelvic pain.  They performed an ultrasound which revealed a 1.2 x 0.8 cm left adnexal mass.  Differential diagnosis included bowel versus residual ovarian tissue.  They said that she needed to "see a surgeon to have this removed".  The patient complains of burning severe pain in her left adnexa.  It radiates down either side of her left leg into her left foot.  Her left foot will go numb including her toes.  She has had this pain ever since she had surgery to repair a left inguinal hernia.  The surgery was performed per her report urgently at [facility].  She has a vertical incision over the left inguinal canal.  She is extremely tender to palpation in that area.  Pain is out of proportion to exam.  There is no palpable mass or abnormality in that area however this elicits the pain and it radiates into the anterior upper portion of her left leg.  The pain is neuropathic in nature.  She denies any back pain.  However she is unable to work at the present time per her report because of the pain.  She has been taking 4-5 tramadol per day for pain along with Toradol. Past Medical History:  Diagnosis Date  . Anemia   . Anxiety   . Cancer (HCC)    cervical  . Chronic back pain   . Chronic pelvic pain in female   . Depression   . Endometriosis   . Fibromyalgia   . Headache(784.0)   . History of kidney stones   . Interstitial cystitis   . Ovarian cyst   . Suicide attempt [facility])    Past Surgical History:  Procedure Laterality Date  . ABDOMINAL HYSTERECTOMY    . CESAREAN SECTION    . HERNIA REPAIR Left    [facility]  . LAPAROSCOPIC SALPINGO OOPHERECTOMY Left 08/10/2016   Procedure: LAPAROSCOPIC SALPINGO OOPHORECTOMY;  Surgeon: Jonnie Kind, MD;  Location: [facility];  Service: Gynecology;   Laterality: Left;  possible open procedure   Current Outpatient Medications on File Prior to Visit  Medication Sig Dispense Refill  . Carboxymethylcellulose Sodium (MOISTURIZING LUBRICANT EYE OP) Place 1-2 drops into both eyes 3 (three) times daily as needed (for dry eyes).    . cephALEXin (KEFLEX) 500 MG capsule Take 1 capsule (500 mg total) by mouth 4 (four) times daily for 5 days. 20 capsule 0  . cyclobenzaprine (FLEXERIL) 5 MG tablet Take 1 tablet (5 mg total) by mouth 3 (three) times daily as needed for muscle spasms. 30 tablet 2  . ibuprofen (ADVIL,MOTRIN) 200 MG tablet Take 800 mg by mouth every 4 (four) hours as needed.    Marland Kitchen ketorolac (TORADOL) 10 MG tablet Take 1 tablet (10 mg total) by mouth every 6 (six) hours as needed. (Patient not taking: Reported on 01/04/2019) 20 tablet 0  . mirabegron ER (MYRBETRIQ) 25 MG TB24 tablet Take 1 tablet (25 mg total) by mouth daily. 30 tablet 5  . ondansetron (ZOFRAN-ODT) 4 MG disintegrating tablet Take 1 tablet (4 mg total) by mouth every 8 (eight) hours as needed for nausea or vomiting. 20 tablet 0  . oxybutynin (DITROPAN-XL) 5 MG 24 hr tablet Take 1-2 tablets (5-10 mg total) by mouth daily.  Titrate dose up by 5 mg each week for sx improvement with toleration of SE 90 tablet 1  . phenazopyridine (PYRIDIUM) 200 MG tablet Take 1 tablet (200 mg total) by mouth 3 (three) times daily as needed for pain (painful urination). 10 tablet 0   Current Facility-Administered Medications on File Prior to Visit  Medication Dose Route Frequency Provider Last Rate Last Dose  . cefTRIAXone (ROCEPHIN) injection 1 g  1 g Intramuscular Once Delsa Grana, PA-C       Allergies  Allergen Reactions  . Ciprofloxacin Nausea And Vomiting  . Hydrocodone Hives   Social History   Socioeconomic History  . Marital status: Divorced    Spouse name: Not on file  . Number of children: Not on file  . Years of education: Not on file  . Highest education level: Not on file   Occupational History  . Not on file  Social Needs  . Financial resource strain: Not on file  . Food insecurity    Worry: Not on file    Inability: Not on file  . Transportation needs    Medical: Not on file    Non-medical: Not on file  Tobacco Use  . Smoking status: Current Every Day Smoker    Packs/day: 1.00    Years: 15.00    Pack years: 15.00    Types: Cigarettes  . Smokeless tobacco: Never Used  Substance and Sexual Activity  . Alcohol use: No  . Drug use: Yes    Types: Marijuana    Comment: once a day  . Sexual activity: Yes    Partners: Male    Birth control/protection: Surgical    Comment: Hysterectomy  Lifestyle  . Physical activity    Days per week: Not on file    Minutes per session: Not on file  . Stress: Not on file  Relationships  . Social Herbalist on phone: Not on file    Gets together: Not on file    Attends religious service: Not on file    Active member of club or organization: Not on file    Attends meetings of clubs or organizations: Not on file    Relationship status: Not on file  . Intimate partner violence    Fear of current or ex partner: Not on file    Emotionally abused: Not on file    Physically abused: Not on file    Forced sexual activity: Not on file  Other Topics Concern  . Not on file  Social History Narrative  . Not on file     Review of Systems     Objective:   Physical Exam Vitals signs reviewed.  Cardiovascular:     Rate and Rhythm: Normal rate and regular rhythm.     Heart sounds: Normal heart sounds.  Pulmonary:     Effort: Pulmonary effort is normal.     Breath sounds: Normal breath sounds.  Abdominal:     General: Abdomen is flat. Bowel sounds are normal.     Palpations: Abdomen is soft.     Tenderness: There is abdominal tenderness.             Assessment & Plan:  The primary encounter diagnosis was Adnexal mass. 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 I feel that a simple referral to a general surgeon is inappropriate at this point without further work-up.  The patient does have  a small 1 cm left adnexal mass however I am not certain that this is causing the severe neuropathic pain radiating down her left leg.  I believe we need to begin by getting nerve conduction studies of the left leg to evaluate for any potential nerve damage.  I am not sure if the patient has scar tissue from her left inguinal hernia surgery that could be impinging upon the femoral nerve or if this could be endometriosis growing along the femoral nerve however I believe nerve conduction studies would document the presence of some type of neuropathy causing her pain particular given the severity of the symptoms.  I would also get a CT scan of the abdomen and pelvis to characterize the left adnexal mass further and determine if further work-up is necessary prior to a consultation with surgery to remove the mass

## 2019-01-16 ENCOUNTER — Ambulatory Visit: Payer: Self-pay | Admitting: Family Medicine

## 2019-01-18 ENCOUNTER — Ambulatory Visit: Payer: Medicaid Other | Admitting: Family Medicine

## 2019-01-22 ENCOUNTER — Other Ambulatory Visit: Payer: Self-pay | Admitting: Family Medicine

## 2019-01-22 DIAGNOSIS — G5710 Meralgia paresthetica, unspecified lower limb: Secondary | ICD-10-CM

## 2019-01-23 ENCOUNTER — Encounter: Payer: Self-pay | Admitting: Neurology

## 2019-01-29 ENCOUNTER — Other Ambulatory Visit: Payer: Self-pay

## 2019-01-29 ENCOUNTER — Ambulatory Visit (HOSPITAL_COMMUNITY)
Admission: RE | Admit: 2019-01-29 | Discharge: 2019-01-29 | Disposition: A | Discharge status: Home / Self Care | Payer: Medicaid Other | Source: Ambulatory Visit | Attending: Family Medicine | Admitting: Family Medicine

## 2019-01-29 DIAGNOSIS — N9489 Other specified conditions associated with female genital organs and menstrual cycle: Secondary | ICD-10-CM | POA: Diagnosis not present

## 2019-01-29 DIAGNOSIS — G5710 Meralgia paresthetica, unspecified lower limb: Secondary | ICD-10-CM

## 2019-01-29 DIAGNOSIS — N200 Calculus of kidney: Secondary | ICD-10-CM | POA: Diagnosis not present

## 2019-01-29 MED ORDER — IOHEXOL 300 MG/ML  SOLN
75.0000 mL | Freq: Once | INTRAMUSCULAR | Status: AC | PRN
  Administered 2019-01-29: 75 mL via INTRAVENOUS

## 2019-03-14 ENCOUNTER — Encounter: Payer: Self-pay | Admitting: Neurology

## 2019-03-19 ENCOUNTER — Ambulatory Visit: Payer: Medicaid Other | Admitting: Neurology

## 2019-03-19 ENCOUNTER — Encounter: Payer: Self-pay | Admitting: Neurology

## 2019-03-19 ENCOUNTER — Other Ambulatory Visit: Payer: Self-pay

## 2019-03-19 VITALS — BP 102/58 | HR 88 | Ht 61.0 in | Wt 96.0 lb

## 2019-03-19 DIAGNOSIS — M79605 Pain in left leg: Secondary | ICD-10-CM

## 2019-03-19 NOTE — Patient Instructions (Signed)
Nerve testing of the left leg.  Do not apply any lotion or oil on the leg.  ELECTROMYOGRAM AND NERVE CONDUCTION STUDIES (EMG/NCS) INSTRUCTIONS  How to Prepare The neurologist conducting the EMG will need to know if you have certain medical conditions. Tell the neurologist and other EMG lab personnel if you: . Have a pacemaker or any other electrical medical device . Take blood-thinning medications . Have hemophilia, a blood-clotting disorder that causes prolonged bleeding Bathing Take a shower or bath shortly before your exam in order to remove oils from your skin. Don't apply lotions or creams before the exam.  What to Expect You'll likely be asked to change into a hospital gown for the procedure and lie down on an examination table. The following explanations can help you understand what will happen during the exam.  . Electrodes. The neurologist or a technician places surface electrodes at various locations on your skin depending on where you're experiencing symptoms. Or the neurologist may insert needle electrodes at different sites depending on your symptoms.  . Sensations. The electrodes will at times transmit a tiny electrical current that you may feel as a twinge or spasm. The needle electrode may cause discomfort or pain that usually ends shortly after the needle is removed. If you are concerned about discomfort or pain, you may want to talk to the neurologist about taking a short break during the exam.  . Instructions. During the needle EMG, the neurologist will assess whether there is any spontaneous electrical activity when the muscle is at rest - activity that isn't present in healthy muscle tissue - and the degree of activity when you slightly contract the muscle.  He or she will give you instructions on resting and contracting a muscle at appropriate times. Depending on what muscles and nerves the neurologist is examining, he or she may ask you to change positions during the exam.   After your EMG You may experience some temporary, minor bruising where the needle electrode was inserted into your muscle. This bruising should fade within several days. If it persists, contact your primary care doctor.

## 2019-03-19 NOTE — Progress Notes (Signed)
[facility] Clinic Note - Initial Visit   Date: 03/19/19  [name] MRN: [id number] DOB: 09-24-1980   Dear Dr. Dennard Schaumann:  Thank you for your kind referral of [name] for consultation of left leg pain. Although her history is well known to you, please allow Susan Gross to reiterate it for the purpose of our medical record. The patient was accompanied to the clinic by self.    History of Present Illness: Susan Gross is a 38 y.o. right-handed female with interstitial cystitis, fibromyalgia, and endometriosis presenting for evaluation of left leg pain.   Starting around March 2020, she began having burning, sharp pain over the left inguinal region, medial thigh, and into the back of the leg into her toes.  Pain is diffuse, mostly in the upper thigh/inguinal region, but then also involves posterior thigh into the foot.  Symptoms are improved by hot showers. It is worse with overexertion.  She complains of left leg weakness.  She has not suffered any falls and walks unassisted.  She denies similar symptoms in the right leg.  No low back pain.    There was initial concern whether she had left adenexal mass causing her pain, however CT abdomen/pelvis did not show pelvic mass.   She works part-time as a Chief Operating Officer.   Past Medical History:  Diagnosis Date  . Anemia   . Anxiety   . Cancer (HCC)    cervical  . Chronic back pain   . Chronic pelvic pain in female   . Depression   . Endometriosis   . Fibromyalgia   . Headache(784.0)   . History of kidney stones   . Interstitial cystitis   . Ovarian cyst   . Suicide attempt [facility])     Past Surgical History:  Procedure Laterality Date  . ABDOMINAL HYSTERECTOMY    . CESAREAN SECTION    . HERNIA REPAIR Left    LIH  . LAPAROSCOPIC SALPINGO OOPHERECTOMY Left 08/10/2016   Procedure: LAPAROSCOPIC SALPINGO OOPHORECTOMY;  Surgeon: Jonnie Kind, MD;  Location: [facility];  Service: Gynecology;  Laterality:  Left;  possible open procedure     Medications:  Outpatient Encounter Medications as of 03/19/2019  Medication Sig  . Carboxymethylcellulose Sodium (MOISTURIZING LUBRICANT EYE OP) Place 1-2 drops into both eyes 3 (three) times daily as needed (for dry eyes).  . cyclobenzaprine (FLEXERIL) 5 MG tablet Take 1 tablet (5 mg total) by mouth 3 (three) times daily as needed for muscle spasms.  Marland Kitchen ibuprofen (ADVIL,MOTRIN) 200 MG tablet Take 800 mg by mouth every 4 (four) hours as needed.  . mirabegron ER (MYRBETRIQ) 25 MG TB24 tablet Take 1 tablet (25 mg total) by mouth daily.  . ondansetron (ZOFRAN-ODT) 4 MG disintegrating tablet Take 1 tablet (4 mg total) by mouth every 8 (eight) hours as needed for nausea or vomiting.  Marland Kitchen oxybutynin (DITROPAN-XL) 5 MG 24 hr tablet Take 1-2 tablets (5-10 mg total) by mouth daily. Titrate dose up by 5 mg each week for sx improvement with toleration of SE  . phenazopyridine (PYRIDIUM) 200 MG tablet Take 1 tablet (200 mg total) by mouth 3 (three) times daily as needed for pain (painful urination).  Marland Kitchen ketorolac (TORADOL) 10 MG tablet Take 1 tablet (10 mg total) by mouth every 6 (six) hours as needed. (Patient not taking: Reported on 03/19/2019)   Facility-Administered Encounter Medications as of 03/19/2019  Medication  . cefTRIAXone (ROCEPHIN) injection 1 g    Allergies:  Allergies  Allergen Reactions  . Ciprofloxacin Nausea And Vomiting  . Hydrocodone Hives    Family History: Family History  Problem Relation Age of Onset  . Hypertension Mother   . Hypertension Father   . Heart attack Father   . Muscular dystrophy Brother   . Cancer Paternal Grandfather   . Other Paternal Grandmother        bleeding ulcers  . Irritable bowel syndrome Paternal Grandmother   . Fibromyalgia Maternal Grandmother   . Cancer Maternal Grandfather   . Bipolar disorder Sister   . Bipolar disorder Sister     Social History: Social History   Tobacco Use  . Smoking status:  Current Every Day Smoker    Packs/day: 1.00    Years: 15.00    Pack years: 15.00    Types: Cigarettes  . Smokeless tobacco: Never Used  Substance Use Topics  . Alcohol use: No  . Drug use: Yes    Types: Marijuana    Comment: once a day   Social History   Social History Narrative   2 children   One story home   Right handed     Review of Systems:  CONSTITUTIONAL: No fevers, chills, night sweats, or weight loss.   EYES: No visual changes or eye pain ENT: No hearing changes.  No history of nose bleeds.   RESPIRATORY: No cough, wheezing and shortness of breath.   CARDIOVASCULAR: Negative for chest pain, and palpitations.   GI: Negative for abdominal discomfort, blood in stools or black stools.  No recent change in bowel habits.   GU:  No history of incontinence.   MUSCLOSKELETAL: +history of joint pain or swelling.  +myalgias.   SKIN: Negative for lesions, rash, and itching.   HEMATOLOGY/ONCOLOGY: Negative for prolonged bleeding, bruising easily, and swollen nodes.  No history of cancer.   ENDOCRINE: Negative for cold or heat intolerance, polydipsia or goiter.   PSYCH:  +depression or anxiety symptoms.   NEURO: As Above.   Vital Signs:  BP (!) 102/58   Pulse 88   Ht 5\' 1"  (1.549 m)   Wt 96 lb (43.5 kg)   SpO2 99%   BMI 18.14 kg/m    General Medical Exam:   General:  Thin-appearing, comfortable.   Eyes/ENT: see cranial nerve examination.   Neck:   No carotid bruits. Respiratory:  Clear to auscultation, good air entry bilaterally.   Cardiac:  Regular rate and rhythm, no murmur.   Extremities:  No deformities, edema, or skin discoloration.  Skin:  No rashes or lesions.  Neurological Exam: MENTAL STATUS including orientation to time, place, person, recent and remote memory, attention span and concentration, language, and fund of knowledge is normal.  Speech is not dysarthric.  CRANIAL NERVES: II:  No visual field defects.  III-IV-VI: Pupils equal round and reactive  to light.  Normal conjugate, extra-ocular eye movements in all directions of gaze.  No nystagmus.  No ptosis.   V:  Normal facial sensation.    VII:  Normal facial symmetry and movements.   VIII:  Normal hearing and vestibular function.   IX-X:  Normal palatal movement.   XI:  Normal shoulder shrug and head rotation.   XII:  Normal tongue strength and range of motion, no deviation or fasciculation. Tongue piercing.  MOTOR:  No atrophy, fasciculations or abnormal movements.  No pronator drift.   Upper Extremity:  Right  Left  Deltoid  5/5   5/5   Biceps  5/5   5/5  Triceps  5/5   5/5   Infraspinatus 5/5  5/5  Medial pectoralis 5/5  5/5  Wrist extensors  5/5   5/5   Wrist flexors  5/5   5/5   Finger extensors  5/5   5/5   Finger flexors  5/5   5/5   Dorsal interossei  5/5   5/5   Abductor pollicis  5/5   5/5   Tone (Ashworth scale)  0  0   Lower Extremity:  Right  Left  Hip flexors  5/5   5/5   Hip extensors  5/5   5/5   Adductor 5/5  5/5  Abductor 5/5  5/5  Knee flexors  5/5   5/5   Knee extensors  5/5   5/5   Dorsiflexors  5/5   5/5   Plantarflexors  5/5   5/5   Toe extensors  5/5   5/5   Toe flexors  5/5   5/5   Tone (Ashworth scale)  0  0   MSRs:  Right        Left                  brachioradialis 2+  2+  biceps 2+  2+  triceps 2+  2+  patellar 2+  2+  ankle jerk 2+  2+  Hoffman no  no  plantar response down  down   SENSORY:  Normal and symmetric perception of light touch, pinprick, vibration, and proprioception.  Romberg's sign absent.   COORDINATION/GAIT: Normal finger-to- nose-finger.  Intact rapid alternating movements bilaterally. Gait slow, antalgic, and stable. Tandem and stressed gait intact.    IMPRESSION: Diffuse left leg pain involving the inguinal region, medial thigh, and posterior leg into the foot.  Neurological exam does not disclose any abnormalities.  It is unlikely that her pain is stemming from a primary nerve-related pathology as her  symptoms do not conform to a nerve distribution.  To be complete, NCS/EMG of the left leg will be ordered.  Further recommendations pending results.  Thank you for allowing me to participate in patient's care.  If I can answer any additional questions, I would be pleased to do so.    Sincerely,     K. Posey Pronto, DO

## 2019-04-03 ENCOUNTER — Other Ambulatory Visit: Payer: Self-pay

## 2019-04-03 ENCOUNTER — Ambulatory Visit (INDEPENDENT_AMBULATORY_CARE_PROVIDER_SITE_OTHER): Payer: Medicaid Other | Admitting: Neurology

## 2019-04-03 DIAGNOSIS — M79605 Pain in left leg: Secondary | ICD-10-CM | POA: Diagnosis not present

## 2019-04-03 NOTE — Procedures (Signed)
[facility]  [address], [address]  [place], [place] [address] Tel: [phone] Fax:  [phone] Test Date:  04/03/2019  Patient: Susan Gross DOB: 01/31/1981 Physician: Narda Amber, DO  Sex: Female Height: 5\' 1"  Ref Phys: Narda Amber, DO  ID#: [id number] Temp: 33.0C Technician:    Patient Complaints: This is a 38 year old female referred for evaluation of left leg pain and numbness.  NCV & EMG Findings: Extensive electrodiagnostic testing of the left lower extremity shows:  1. Left sural and superficial peroneal sensory responses are within normal limits. 2. Left peroneal and tibial motor responses are within normal limits. 3. Left tibial H reflex study is within normal limits. 4. There is no evidence of active or chronic motor axonal loss changes affecting any of the tested muscles.  Motor unit configuration and recruitment pattern is within normal limits.  Impression: This is a normal study of the left lower extremity.  In particular, there is no evidence of a sensorimotor polyneuropathy or lumbosacral radiculopathy   ___________________________ Narda Amber, DO    Nerve Conduction Studies Anti Sensory Summary Table   Site NR Peak (ms) Norm Peak (ms) P-T Amp (V) Norm P-T Amp  Left Sup Peroneal Anti Sensory (Ant Lat Mall)  33C  12 cm    2.3 <4.5 12.3 >5  Left Sural Anti Sensory (Lat Mall)  33C  Calf    2.7 <4.5 23.3 >5   Motor Summary Table   Site NR Onset (ms) Norm Onset (ms) O-P Amp (mV) Norm O-P Amp Site1 Site2 Delta-0 (ms) Dist (cm) Vel (m/s) Norm Vel (m/s)  Left Peroneal Motor (Ext Dig Brev)  33C  Ankle    3.5 <5.5 5.8 >3 B Fib Ankle 8.5 34.0 40 >40  B Fib    12.0  5.6  Poplt B Fib 1.6 7.0 44 >40  Poplt    13.6  5.3         Left Peroneal TA Motor (Tib Ant)  33C  Fib Head    3.7 <4.0 4.3 >4 Poplit Fib Head 1.7 7.0 41 >40  Poplit    5.4  3.6         Left Tibial Motor (Abd Hall Brev)  33C  Ankle    3.2 <6.0 14.7 >8 Knee Ankle 8.8 35.0  40 >40  Knee    12.0  10.9          H Reflex Studies   NR H-Lat (ms) Lat Norm (ms) L-R H-Lat (ms)  Left Tibial (Gastroc)  33C     34.15 <35    EMG   Side Muscle Ins Act Fibs Psw Fasc Number Recrt Dur Dur. Amp Amp. Poly Poly. Comment  Left AntTibialis Nml Nml Nml Nml Nml Nml Nml Nml Nml Nml Nml Nml N/A  Left Gastroc Nml Nml Nml Nml Nml Nml Nml Nml Nml Nml Nml Nml N/A  Left Flex Dig Long Nml Nml Nml Nml Nml Nml Nml Nml Nml Nml Nml Nml N/A  Left RectFemoris Nml Nml Nml Nml Nml Nml Nml Nml Nml Nml Nml Nml N/A  Left GluteusMed Nml Nml Nml Nml Nml Nml Nml Nml Nml Nml Nml Nml N/A  Left Lumbo Parasp Low Nml Nml Nml Nml NE - - - - - - - N/A  Left AdductorLong Nml Nml Nml Nml Nml Nml Nml Nml Nml Nml Nml Nml N/A      Waveforms:

## 2019-04-05 NOTE — Progress Notes (Signed)
Nerve conduction tests were totally normal.  Next step would be a pain clinic referral for chronic pelvic pain.

## 2019-04-06 ENCOUNTER — Telehealth: Payer: Self-pay

## 2019-04-06 NOTE — Progress Notes (Signed)
Patient aware of results and of providers recommendations via mychart

## 2019-04-06 NOTE — Telephone Encounter (Signed)
No answer at 113

## 2019-04-06 NOTE — Telephone Encounter (Signed)
-----   Message from Alda Berthold, DO sent at 04/06/2019 11:30 AM EDT ----- Please inform patient that her nerve testing is normal.  I would recommend trying physical therapy for leg and back stretching and strengthening to see if that helps.  We can order MRI lumbar spine to look into her symptoms further, but insurance typically needs to see that physical therapy has been done first.  If she is agreeable, please send referral for PT.  Thanks.

## 2019-04-09 NOTE — Progress Notes (Signed)
Letter created and mailed to patient today, Tried calling multiple attempts, no return call.

## 2019-04-17 ENCOUNTER — Other Ambulatory Visit: Payer: Self-pay

## 2019-04-17 ENCOUNTER — Ambulatory Visit (INDEPENDENT_AMBULATORY_CARE_PROVIDER_SITE_OTHER): Payer: Medicaid Other | Admitting: Family Medicine

## 2019-04-17 ENCOUNTER — Encounter: Payer: Self-pay | Admitting: Family Medicine

## 2019-04-17 VITALS — BP 132/76 | HR 100 | Temp 98.1°F | Resp 16 | Ht 61.0 in | Wt 97.4 lb

## 2019-04-17 DIAGNOSIS — N3010 Interstitial cystitis (chronic) without hematuria: Secondary | ICD-10-CM

## 2019-04-17 DIAGNOSIS — K047 Periapical abscess without sinus: Secondary | ICD-10-CM

## 2019-04-17 DIAGNOSIS — J019 Acute sinusitis, unspecified: Secondary | ICD-10-CM

## 2019-04-17 LAB — URINALYSIS, ROUTINE W REFLEX MICROSCOPIC
Bilirubin Urine: NEGATIVE
Glucose, UA: NEGATIVE
Hyaline Cast: NONE SEEN /LPF
Ketones, ur: NEGATIVE
Leukocytes,Ua: NEGATIVE
Nitrite: NEGATIVE
Protein, ur: NEGATIVE
Specific Gravity, Urine: 1.02 (ref 1.001–1.03)
WBC, UA: NONE SEEN /HPF (ref 0–5)
pH: 7 (ref 5.0–8.0)

## 2019-04-17 LAB — MICROSCOPIC MESSAGE

## 2019-04-17 MED ORDER — FLUCONAZOLE 150 MG PO TABS: 150.0000 mg | ORAL_TABLET | Freq: Once | ORAL | 1 refills | Status: AC

## 2019-04-17 MED ORDER — IBUPROFEN 600 MG PO TABS: 600.0000 mg | ORAL_TABLET | Freq: Three times a day (TID) | ORAL | 1 refills | Status: DC | PRN

## 2019-04-17 MED ORDER — AMOXICILLIN 875 MG PO TABS: 875.0000 mg | ORAL_TABLET | Freq: Two times a day (BID) | ORAL | 0 refills | Status: DC

## 2019-04-17 NOTE — Progress Notes (Signed)
   Subjective:    Patient ID: Susan Gross, female    DOB: 28-Sep-1980, 38 y.o.   MRN: [id number]  Patient presents for Dysuria (x1 week- burning with urination, frequency and urgency, hematuria, back/ flank pain radiaiting down legs), Dental Abscess (x2 days- states that she has bad tooth that is causing her pain- reports that she can taste infection), and Sinus Issues (nasal drainage, productive cough with green colored mucus)    Pt here with dysuria, uregency , has had some hematuria, only small amounts, has some back pain as well states radiates down her legs but this was being worked up by neurology.  She was advised to proceed with physical therapy they cannot get an MRI covered.  She was following with urology but admits that she is not followed up since they gave her some type of pill to change her urine a different color.  She takes mybetriq to stop the OAB   history of hysterectomy     Sinus pressure and drainage, has two bad teeth as well , she is suppose to have teeth removed but the oral surgeon is behind   she has been gargling salt water, ibuprofen  no allergy med       Review Of Systems:  GEN- denies fatigue, fever, weight loss,weakness, recent illness HEENT- denies eye drainage, change in vision, nasal discharge, CVS- denies chest pain, palpitations RESP- denies SOB, cough, wheeze ABD- denies N/V, change in stools, abd pain GU- +dysuria, +hematuria, dribbling, incontinence MSK- denies joint pain, muscle aches, injury Neuro- denies headache, dizziness, syncope, seizure activity       Objective:    BP 132/76   Pulse 100   Temp 98.1 F (36.7 C) (Oral)   Resp 16   Ht 5\' 1"  (1.549 m)   Wt 97 lb 6.4 oz (44.2 kg)   SpO2 97%   BMI 18.40 kg/m  GEN- NAD, alert and oriented x3 HEENT- PERRL, EOMI, non injected sclera, pink conjunctiva, MMM, oropharynx clear, poor dentition abscess right lower tooth.  Boggy gums.  Mild sinus drainage in nares mild tenderness to  palpation right maxillary sinus region Neck- Supple, shotty lymphadenopathy  CVS- RRR, no murmur RESP-CTAB ABD-NABS,soft, to palpation suprapubic region tender to palpation bilateral flank region EXT- No edema Pulses- Radial2+        Assessment & Plan:    During her back pain with radiculopathy I gave her the number for her neurologist they were trying to set up physical therapy and then an MRI would be the next step. Problem List Items Addressed This Visit    None    Visit Diagnoses    IC (interstitial cystitis)    -  Primary   History of known interstitial cystitis recommend that she follow-up with urology we will start amoxicillin which also cover the dental abscess    Relevant Orders   Urinalysis, Routine w reflex microscopic   Urine Culture   Tooth abscess       Ibuprofen salt water gargle.  For the sinuses also given a sample of Nasacort   Acute sinusitis, recurrence not specified, unspecified location       Relevant Medications   amoxicillin (AMOXIL) 875 MG tablet   fluconazole (DIFLUCAN) 150 MG tablet      Note: This dictation was prepared with Dragon dictation along with smaller phrase technology. Any transcriptional errors that result from this process are unintentional.

## 2019-04-17 NOTE — Patient Instructions (Addendum)
Follow up with urology Take the antibiotics Yeast pill- Dilfucan sent if you get yeast infection Ibuprofen for pain Call the neurologist for your back  [phone] ( Dr. Posey Pronto) Use allergy med for sinuses  F/U as needed

## 2019-04-19 LAB — URINE CULTURE
MICRO NUMBER:: 1009046
SPECIMEN QUALITY:: ADEQUATE

## 2019-08-11 ENCOUNTER — Other Ambulatory Visit: Payer: Self-pay | Admitting: Family Medicine

## 2020-02-12 ENCOUNTER — Emergency Department (HOSPITAL_COMMUNITY)
Admission: EM | Admit: 2020-02-12 | Discharge: 2020-02-12 | Disposition: A | Discharge status: Home / Self Care | Payer: Medicaid Other | Attending: Emergency Medicine | Admitting: Emergency Medicine

## 2020-02-12 ENCOUNTER — Other Ambulatory Visit: Payer: Self-pay

## 2020-02-12 ENCOUNTER — Emergency Department (HOSPITAL_COMMUNITY): Payer: Medicaid Other

## 2020-02-12 ENCOUNTER — Encounter (HOSPITAL_COMMUNITY): Payer: Self-pay | Admitting: *Deleted

## 2020-02-12 DIAGNOSIS — R0602 Shortness of breath: Secondary | ICD-10-CM | POA: Diagnosis not present

## 2020-02-12 DIAGNOSIS — R110 Nausea: Secondary | ICD-10-CM | POA: Insufficient documentation

## 2020-02-12 DIAGNOSIS — R079 Chest pain, unspecified: Secondary | ICD-10-CM | POA: Diagnosis not present

## 2020-02-12 DIAGNOSIS — R0789 Other chest pain: Secondary | ICD-10-CM | POA: Diagnosis not present

## 2020-02-12 DIAGNOSIS — F17210 Nicotine dependence, cigarettes, uncomplicated: Secondary | ICD-10-CM | POA: Insufficient documentation

## 2020-02-12 DIAGNOSIS — Z8541 Personal history of malignant neoplasm of cervix uteri: Secondary | ICD-10-CM | POA: Insufficient documentation

## 2020-02-12 LAB — BASIC METABOLIC PANEL
Anion gap: 11 (ref 5–15)
BUN: 15 mg/dL (ref 6–20)
CO2: 24 mmol/L (ref 22–32)
Calcium: 9.1 mg/dL (ref 8.9–10.3)
Chloride: 102 mmol/L (ref 98–111)
Creatinine, Ser: 0.63 mg/dL (ref 0.44–1.00)
GFR calc Af Amer: >60 mL/min (ref >60)
GFR calc non Af Amer: >60 mL/min (ref >60)
Glucose, Bld: 121 mg/dL — ABNORMAL HIGH (ref 70–99)
Potassium: 3.8 mmol/L (ref 3.5–5.1)
Sodium: 137 mmol/L (ref 135–145)

## 2020-02-12 LAB — URINALYSIS, ROUTINE W REFLEX MICROSCOPIC
Bilirubin Urine: NEGATIVE
Glucose, UA: NEGATIVE mg/dL
Hgb urine dipstick: NEGATIVE
Ketones, ur: 80 mg/dL — AB
Leukocytes,Ua: NEGATIVE
Nitrite: NEGATIVE
Protein, ur: NEGATIVE mg/dL
Specific Gravity, Urine: 1.026 (ref 1.005–1.030)
pH: 5 (ref 5.0–8.0)

## 2020-02-12 LAB — HEPATIC FUNCTION PANEL
ALT: 18 U/L (ref 0–44)
AST: 22 U/L (ref 15–41)
Albumin: 4.4 g/dL (ref 3.5–5.0)
Alkaline Phosphatase: 60 U/L (ref 38–126)
Bilirubin, Direct: 0.1 mg/dL (ref 0.0–0.2)
Indirect Bilirubin: 0.4 mg/dL (ref 0.3–0.9)
Total Bilirubin: 0.5 mg/dL (ref 0.3–1.2)
Total Protein: 7.3 g/dL (ref 6.5–8.1)

## 2020-02-12 LAB — LIPASE, BLOOD: Lipase: 27 U/L (ref 11–51)

## 2020-02-12 LAB — CBC
HCT: 49.1 % — ABNORMAL HIGH (ref 36.0–46.0)
Hemoglobin: 16.5 g/dL — ABNORMAL HIGH (ref 12.0–15.0)
MCH: 32.2 pg (ref 26.0–34.0)
MCHC: 33.6 g/dL (ref 30.0–36.0)
MCV: 95.7 fL (ref 80.0–100.0)
Platelets: 222 10*3/uL (ref 150–400)
RBC: 5.13 MIL/uL — ABNORMAL HIGH (ref 3.87–5.11)
RDW: 13.2 % (ref 11.5–15.5)
WBC: 10.4 10*3/uL (ref 4.0–10.5)
nRBC: 0 % (ref 0.0–0.2)

## 2020-02-12 LAB — D-DIMER, QUANTITATIVE: D-Dimer, Quant: 0.33 ug/mL-FEU (ref 0.00–0.50)

## 2020-02-12 LAB — POCT PREGNANCY, URINE: Preg Test, Ur: NEGATIVE

## 2020-02-12 LAB — TROPONIN I (HIGH SENSITIVITY)
Troponin I (High Sensitivity): <2 ng/L (ref <18)
Troponin I (High Sensitivity): <2 ng/L (ref <18)

## 2020-02-12 MED ORDER — IBUPROFEN 600 MG PO TABS
600.0000 mg | ORAL_TABLET | Freq: Four times a day (QID) | ORAL | 0 refills | Status: DC | PRN
Start: 2020-02-12 — End: 2020-05-05

## 2020-02-12 MED ORDER — KETOROLAC TROMETHAMINE 30 MG/ML IJ SOLN
15.0000 mg | Freq: Once | INTRAMUSCULAR | Status: AC
  Administered 2020-02-12: 15 mg via INTRAVENOUS
  Filled 2020-02-12: qty 1

## 2020-02-12 MED ORDER — ASPIRIN 81 MG PO CHEW
324.0000 mg | CHEWABLE_TABLET | Freq: Once | ORAL | Status: AC
  Administered 2020-02-12: 324 mg via ORAL
  Filled 2020-02-12: qty 4

## 2020-02-12 MED ORDER — SODIUM CHLORIDE 0.9 % IV BOLUS
1000.0000 mL | Freq: Once | INTRAVENOUS | Status: AC
  Administered 2020-02-12: 1000 mL via INTRAVENOUS

## 2020-02-12 NOTE — ED Triage Notes (Signed)
C/o pain in right side of chest radiating down right arm, denies fever. States pain woke her up from sleep last night

## 2020-02-12 NOTE — Discharge Instructions (Addendum)
Your lab tests, EKG and chest x-ray are all reassuring today with no evidence of your pain be related to your heart, a lung infection such as pneumonia or blood clot.  Your exam suggest a chest wall strain such as muscle or cartilage strain.  Use the medication prescribed for pain relief, you might also benefit from a heating pad applied directly to the site.  Plan to see your primary doctor for recheck if your symptoms are not resolved over the next week with this treatment plan.

## 2020-02-12 NOTE — ED Provider Notes (Signed)
[facility] EMERGENCY DEPARTMENT Provider Note   CSN: [id number] Arrival date & time: 02/12/20  1419     History Chief Complaint  Patient presents with  . Chest Pain    Susan Gross is a 39 y.o. female with a history of anemia, anxiety fibromyalgia, interstitial cystitis, distant history of cervical cancer with complete hysterectomy presenting for evaluation of right-sided chest pain along with palpitations, shortness of breath.  Pain radiates into her right upper arm and also directly into her mid right back.  This woke her from sleep last night, she denies fevers or chills, cough.  Her pain is pleuritic in character also worsened with certain positions and improved with direct pressure applied to her right chest wall.  She denies any injuries or falls.  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  She also reports having nausea earlier today which has resolved.  She denies abdominal pain, flank pain, dysuria or hematuria, but does endorse reduced appetite, stating her last meal was consumed yesterday afternoon..  She has had no alleviators for symptoms.  HPI  HPI: A 39 year old patient presents for evaluation of chest pain. Initial onset of pain was more than 6 hours ago. The patient's chest pain is sharp and is not worse with exertion. The patient's chest pain is not middle- or left-sided, is not well-localized, is not described as heaviness/pressure/tightness and does radiate to the arms/jaw/neck. The patient does not complain of nausea and denies diaphoresis. The patient has smoked in the past 90 days and has a family history of coronary artery disease in a first-degree relative with onset less than age 23. The patient has no history of stroke, has no history of peripheral artery disease, denies any history of treated diabetes, is not hypertensive, has no history of hypercholesterolemia and does not have an elevated BMI (>=30).   Past Medical History:  Diagnosis Date  . Anemia     . Anxiety   . Cancer (HCC)    cervical  . Chronic back pain   . Chronic pelvic pain in female   . Depression   . Endometriosis   . Fibromyalgia   . Headache(784.0)   . History of kidney stones   . Interstitial cystitis   . Ovarian cyst   . Suicide attempt [facility])     Patient Active Problem List   Diagnosis Date Noted  . LLQ pain 08/30/2018  . Vaginal discharge 08/30/2018  . BV (bacterial vaginosis) 08/30/2018  . Bloating 08/30/2018  . History of hernia repair 08/30/2018  . History of endometriosis 08/30/2018  . Lumbar pain 01/11/2018  . Pain in left hip 12/28/2017  . Encounter for postoperative wound check 08/19/2016  . Postoperative pelvic peritoneal adhesions 08/10/2016  . Cyst of left ovary 07/12/2016  . Major depressive disorder, recurrent episode, moderate degree ([place]) 01/26/2016  . Major depressive disorder, recurrent episode, moderate ([place]) 01/26/2016  . Interstitial cystitis   . Chronic back pain   . Chronic pelvic pain in female   . ANXIETY 02/21/2007  . DEPRESSION 02/21/2007  . DYSLEXIA 02/21/2007    Past Surgical History:  Procedure Laterality Date  . ABDOMINAL HYSTERECTOMY    . CESAREAN SECTION    . HERNIA REPAIR Left    LIH  . LAPAROSCOPIC SALPINGO OOPHERECTOMY Left 08/10/2016   Procedure: LAPAROSCOPIC SALPINGO OOPHORECTOMY;  Surgeon: Jonnie Kind, MD;  Location: [facility] ORS;  Service: Gynecology;  Laterality: Left;  possible open procedure     OB History  Gravida  3   Para  2   Term  2   Preterm      AB  1   Living  2     SAB  1   TAB      Ectopic      Multiple      Live Births              Family History  Problem Relation Age of Onset  . Hypertension Mother   . Hypertension Father   . Heart attack Father   . Muscular dystrophy Brother   . Cancer Paternal Grandfather   . Other Paternal Grandmother        bleeding ulcers  . Irritable bowel syndrome Paternal Grandmother   . Fibromyalgia Maternal Grandmother   . Cancer  Maternal Grandfather   . Bipolar disorder Sister   . Bipolar disorder Sister     Social History   Tobacco Use  . Smoking status: Current Every Day Smoker    Packs/day: 1.00    Years: 15.00    Pack years: 15.00    Types: Cigarettes  . Smokeless tobacco: Never Used  Vaping Use  . Vaping Use: Never used  Substance Use Topics  . Alcohol use: No  . Drug use: Yes    Types: Marijuana    Comment: once a day    Home Medications Prior to Admission medications   Medication Sig Start Date End Date Taking? Authorizing Provider  amoxicillin (AMOXIL) 875 MG tablet Take 1 tablet (875 mg total) by mouth 2 (two) times daily. 04/17/19   [place], Modena Nunnery, MD  Carboxymethylcellulose Sodium (MOISTURIZING LUBRICANT EYE OP) Place 1-2 drops into both eyes 3 (three) times daily as needed (for dry eyes).    [provider]  cyclobenzaprine (FLEXERIL) 5 MG tablet Take 1 tablet (5 mg total) by mouth 3 (three) times daily as needed for muscle spasms. 01/04/19   Delsa Grana, PA-C  ibuprofen (ADVIL) 600 MG tablet Take 1 tablet (600 mg total) by mouth every 6 (six) hours as needed. 02/12/20   Evalee Jefferson, PA-C  ketorolac (TORADOL) 10 MG tablet Take 1 tablet (10 mg total) by mouth every 6 (six) hours as needed. Patient not taking: Reported on 04/17/2019 08/30/18   Estill Dooms, NP  mirabegron ER (MYRBETRIQ) 25 MG TB24 tablet Take 1 tablet (25 mg total) by mouth daily. 01/04/19   Delsa Grana, PA-C  ondansetron (ZOFRAN-ODT) 4 MG disintegrating tablet Take 1 tablet (4 mg total) by mouth every 8 (eight) hours as needed for nausea or vomiting. 01/04/19   Delsa Grana, PA-C  phenazopyridine (PYRIDIUM) 200 MG tablet Take 1 tablet (200 mg total) by mouth 3 (three) times daily as needed for pain (painful urination). 01/04/19   Delsa Grana, PA-C    Allergies    Ciprofloxacin and Hydrocodone  Review of Systems   Review of Systems  Constitutional: Positive for appetite change. Negative for chills, diaphoresis  and fever.  HENT: Negative.   Respiratory: Positive for shortness of breath. Negative for choking and wheezing.   Cardiovascular: Positive for chest pain. Negative for palpitations and leg swelling.  Gastrointestinal: Positive for nausea. Negative for vomiting.  Musculoskeletal: Negative.   Skin: Negative.     Physical Exam Updated Vital Signs BP (!) 164/91   Pulse 79   Temp 98.3 F (36.8 C) (Oral)   Resp (!) 21   Ht 5\' 1"  (1.549 m)   Wt 44 kg   SpO2 100%  BMI 18.33 kg/m   Physical Exam Vitals and nursing note reviewed.  Constitutional:      Appearance: She is well-developed.  HENT:     Head: Normocephalic and atraumatic.  Eyes:     Conjunctiva/sclera: Conjunctivae normal.  Cardiovascular:     Rate and Rhythm: Normal rate and regular rhythm.     Heart sounds: Normal heart sounds.  Pulmonary:     Effort: Pulmonary effort is normal. No tachypnea or respiratory distress.     Breath sounds: Normal breath sounds. No decreased breath sounds, wheezing, rhonchi or rales.  Chest:     Chest wall: Tenderness present.     Comments: ttp right mid anterior chest.  No deformity.   Abdominal:     General: Bowel sounds are normal.     Palpations: Abdomen is soft.     Tenderness: There is abdominal tenderness in the right upper quadrant. There is no guarding. Negative signs include Murphy's sign.     Hernia: No hernia is present.  Musculoskeletal:        General: Normal range of motion.     Cervical back: Normal range of motion.  Skin:    General: Skin is warm and dry.  Neurological:     Mental Status: She is alert.     ED Results / Procedures / Treatments   Labs (all labs ordered are listed, but only abnormal results are displayed) Labs Reviewed  BASIC METABOLIC PANEL - Abnormal; Notable for the following components:      Result Value   Glucose, Bld 121 (*)    All other components within normal limits  CBC - Abnormal; Notable for the following components:   RBC 5.13 (*)     Hemoglobin 16.5 (*)    HCT 49.1 (*)    All other components within normal limits  URINALYSIS, ROUTINE W REFLEX MICROSCOPIC - Abnormal; Notable for the following components:   APPearance CLOUDY (*)    Ketones, ur 80 (*)    All other components within normal limits  D-DIMER, QUANTITATIVE (NOT AT [facility])  HEPATIC FUNCTION PANEL  LIPASE, BLOOD  POC URINE PREG, ED  POCT PREGNANCY, URINE  TROPONIN I (HIGH SENSITIVITY)  TROPONIN I (HIGH SENSITIVITY)    EKG EKG Interpretation  Date/Time:  Tuesday February 12 2020 16:55:27 EDT Ventricular Rate:  78 PR Interval:  148 QRS Duration: 100 QT Interval:  398 QTC Calculation: 454 R Axis:   85 Text Interpretation: Sinus rhythm Consider left atrial enlargement Probable left ventricular hypertrophy Similar to prior No STEMI Confirmed by Nanda Quinton [phone]) on 02/12/2020 5:08:48 PM   Radiology DG Chest 2 View  Result Date: 02/12/2020 CLINICAL DATA:  Chest pain. Additional provided: Right-sided chest pain, dizziness, shortness of breath. EXAM: CHEST - 2 VIEW COMPARISON:  Prior chest radiographs 01/04/2017 and earlier FINDINGS: Heart size within normal limits. No appreciable airspace consolidation or pulmonary edema. No evidence of pleural effusion or pneumothorax. No acute bony abnormality identified. IMPRESSION: No evidence of active cardiopulmonary disease. Electronically Signed   By: Kellie Simmering DO   On: 02/12/2020 15:29    Procedures Procedures (including critical care time)  Medications Ordered in ED Medications  sodium chloride 0.9 % bolus 1,000 mL (0 mLs Intravenous Stopped 02/12/20 1806)  aspirin chewable tablet 324 mg (324 mg Oral Given 02/12/20 1717)  ketorolac (TORADOL) 30 MG/ML injection 15 mg (15 mg Intravenous Given 02/12/20 1800)    ED Course  I have reviewed the triage vital signs and the nursing notes.  Pertinent labs & imaging results that were available during my care of the patient were reviewed by me and considered in my  medical decision making (see chart for details).    MDM Rules/Calculators/A&P HEAR Score: 1                        Pt with heart score 1.  Delta troponins negative, doubt ACS,  D dimer low in a low risk pt per Wells criteria, doubt PE.  Exam and hx favors chest wall/ pleuritic pain.  She was placed on nsaids, advised heat tx, plan f/u with pcp  Final Clinical Impression(s) / ED Diagnoses Final diagnoses:  Atypical chest pain    Rx / DC Orders ED Discharge Orders         Ordered    ibuprofen (ADVIL) 600 MG tablet  Every 6 hours PRN     Discontinue  Reprint     02/12/20 Midge Aver, PA-C 02/12/20 1915    Long, Wonda Olds, MD 02/18/20 [phone]

## 2020-02-13 ENCOUNTER — Telehealth: Payer: Self-pay | Admitting: *Deleted

## 2020-02-13 NOTE — Telephone Encounter (Signed)
Contacted patient to complete transition of care assessment:  Transition Care Management Follow-up Telephone Call  . Medicaid Managed Care Transition Call Status:MM [facility] Call Made  . Date of discharge and from where: [facility], 02/12/20  . How have you been since you were released from the hospital? "ok"  . Any questions or concerns? No  Items Reviewed: Marland Kitchen Did the pt receive and understand the discharge instructions provided? Yes  . Medications obtained and verified? Yes Patient states she is on the way to pick up prescription . Any new allergies since your discharge? No  . Dietary orders reviewed? No  Do you have support at home?  Yes, family  Functional Questionnaire: (I = Independent and D = Dependent)  ADLs: Independent Bathing/Dressing:Independent Meal Prep: Independent Eating: Independent Maintaining continence: Independent Transferring/Ambulation: Independent Managing Meds: Independent   Follow up appointments reviewed:   PCP Hospital f/u appt confirmed? No, Patient states she has called Dr Glennon Hamilton office on 02/13/20, and is awaiting a call back to schedule appt   Specialist Hospital f/u appt confirmed? n/a  Are transportation arrangements needed? No   If their condition worsens, is the pt aware to call PCP or go to the EmergencyDept.? Yes  Was the patient provided with contact information for the PCP's office or ED?  Yes  Was to pt encouraged to call back with questions or concerns? Yes  Lenor Coffin, RN, BSN, [place] Patient [place] [phone]

## 2020-05-05 ENCOUNTER — Encounter: Payer: Self-pay | Admitting: Family Medicine

## 2020-05-05 ENCOUNTER — Other Ambulatory Visit: Payer: Self-pay

## 2020-05-05 ENCOUNTER — Ambulatory Visit: Payer: Medicaid Other | Admitting: Family Medicine

## 2020-05-05 VITALS — BP 102/60 | HR 100 | Temp 98.6°F | Resp 14 | Ht 61.0 in | Wt 100.6 lb

## 2020-05-05 DIAGNOSIS — A5901 Trichomonal vulvovaginitis: Secondary | ICD-10-CM

## 2020-05-05 DIAGNOSIS — R300 Dysuria: Secondary | ICD-10-CM | POA: Diagnosis not present

## 2020-05-05 DIAGNOSIS — N3001 Acute cystitis with hematuria: Secondary | ICD-10-CM

## 2020-05-05 DIAGNOSIS — Z113 Encounter for screening for infections with a predominantly sexual mode of transmission: Secondary | ICD-10-CM | POA: Diagnosis not present

## 2020-05-05 DIAGNOSIS — N760 Acute vaginitis: Secondary | ICD-10-CM | POA: Diagnosis not present

## 2020-05-05 LAB — URINALYSIS, ROUTINE W REFLEX MICROSCOPIC
Bilirubin Urine: NEGATIVE
Glucose, UA: NEGATIVE
Hyaline Cast: NONE SEEN /LPF
Ketones, ur: NEGATIVE
Nitrite: NEGATIVE
Protein, ur: NEGATIVE
Specific Gravity, Urine: 1.025 (ref 1.001–1.03)
pH: 6 (ref 5.0–8.0)

## 2020-05-05 LAB — WET PREP FOR TRICH, YEAST, CLUE

## 2020-05-05 LAB — MICROSCOPIC MESSAGE

## 2020-05-05 MED ORDER — MIRABEGRON ER 25 MG PO TB24: 25.0000 mg | ORAL_TABLET | Freq: Every day | ORAL | 2 refills | Status: DC

## 2020-05-05 MED ORDER — NITROFURANTOIN MONOHYD MACRO 100 MG PO CAPS
100.0000 mg | ORAL_CAPSULE | Freq: Two times a day (BID) | ORAL | 0 refills | Status: DC
Start: 2020-05-05 — End: 2022-03-23

## 2020-05-05 MED ORDER — METRONIDAZOLE 500 MG PO TABS: 500.0000 mg | ORAL_TABLET | Freq: Two times a day (BID) | ORAL | 0 refills | Status: DC

## 2020-05-05 MED ORDER — FLUCONAZOLE 150 MG PO TABS
150.0000 mg | ORAL_TABLET | Freq: Once | ORAL | 0 refills | Status: AC
Start: 2020-05-05 — End: 2020-05-05

## 2020-05-05 NOTE — Patient Instructions (Signed)
Take both antibiotics as prescribed Take Diflucan at the end of the antibiotic regimen We will call with results

## 2020-05-05 NOTE — Progress Notes (Signed)
   Subjective:    Patient ID: Susan Gross, female    DOB: 03-10-1981, 39 y.o.   MRN: [id number]  Patient presents for Dysuria (burning with urination) and Vaginal Discharge (STD check)  Patient here with vaginal discharge irritation and discomfort for the past week.  She did have unprotected sex 3 weeks ago with a new partner.  She also has burning with urination and urgency but no frequency.  She has noted an odor.  She does history of underlying interstitial cystitis and has been all of her regular medications for quite some time due to some financial changes.  She would like to restart the Myrbetriq.  She denies any nausea vomiting diarrhea fever.  She does not have a menstrual cycle as she had a hysterectomy in her 73s.   Review Of Systems:  GEN- denies fatigue, fever, weight loss,weakness, recent illness HEENT- denies eye drainage, change in vision, nasal discharge, CVS- denies chest pain, palpitations RESP- denies SOB, cough, wheeze ABD- denies N/V, change in stools, abd pain GU- denies dysuria, hematuria, dribbling, incontinence MSK- denies joint pain, muscle aches, injury Neuro- denies headache, dizziness, syncope, seizure activity       Objective:    BP 102/60   Pulse 100   Temp 98.6 F (37 C) (Temporal)   Resp 14   Ht 5\' 1"  (1.549 m)   Wt 100 lb 9.6 oz (45.6 kg)   SpO2 99%   BMI 19.01 kg/m  GEN- NAD, alert and oriented x3 CVS- RRR, no murmur RESP-CTAB ABD-NABS,soft,TTP  Lower abdomen, no rebound, no gaurding, no CVA tenderness GU- normal external genitalia, vaginal mucosa pink and moist,cervix absent  + copious thick  discharge, no CMT, no ovarian masses, erythema introitus, pain with speculum exam, ovaries no mass palpated  EXT- No edema Pulses- Radial  2+        Assessment & Plan:      Problem List Items Addressed This Visit    None    Visit Diagnoses    Acute cystitis with hematuria    -  Primary   send culture, start macrobid, will plan to  restart myrbetriq which worked well in past for her IC symptoms    Relevant Orders   Urinalysis, Routine w reflex microscopic (Completed)   Urine Culture   CBC with Differential/Platelet   Comprehensive metabolic panel   Screen for STD (sexually transmitted disease)       Relevant Orders   HIV Antibody (routine testing w rflx)   RPR   C. trachomatis/N. gonorrhoeae RNA   Hepatitis C antibody   WET PREP FOR [place], YEAST, CLUE (Completed)   Trichomonas vaginitis       Treat with flagyl, discuss partner needs to be treated, full STD screen done, recommend recheck STD sreen in 2-3 months    Relevant Medications   metroNIDAZOLE (FLAGYL) 500 MG tablet   nitrofurantoin, macrocrystal-monohydrate, (MACROBID) 100 MG capsule   fluconazole (DIFLUCAN) 150 MG tablet   Other Relevant Orders   C. trachomatis/N. gonorrhoeae RNA   WET PREP FOR [place], YEAST, CLUE (Completed)      Note: This dictation was prepared with Dragon dictation along with smaller Company secretary. Any transcriptional errors that result from this process are unintentional.

## 2020-05-06 ENCOUNTER — Telehealth: Payer: Self-pay | Admitting: *Deleted

## 2020-05-06 LAB — COMPREHENSIVE METABOLIC PANEL
AG Ratio: 2 (calc) (ref 1.0–2.5)
ALT: 12 U/L (ref 6–29)
AST: 16 U/L (ref 10–30)
Albumin: 4.2 g/dL (ref 3.6–5.1)
Alkaline phosphatase (APISO): 49 U/L (ref 31–125)
BUN: 9 mg/dL (ref 7–25)
CO2: 23 mmol/L (ref 20–32)
Calcium: 9.1 mg/dL (ref 8.6–10.2)
Chloride: 103 mmol/L (ref 98–110)
Creat: 0.65 mg/dL (ref 0.50–1.10)
Globulin: 2.1 g/dL (calc) (ref 1.9–3.7)
Glucose, Bld: 74 mg/dL (ref 65–99)
Potassium: 4.2 mmol/L (ref 3.5–5.3)
Sodium: 138 mmol/L (ref 135–146)
Total Bilirubin: 0.5 mg/dL (ref 0.2–1.2)
Total Protein: 6.3 g/dL (ref 6.1–8.1)

## 2020-05-06 LAB — CBC WITH DIFFERENTIAL/PLATELET
Absolute Monocytes: 489 cells/uL (ref 200–950)
Basophils Absolute: 58 cells/uL (ref 0–200)
Basophils Relative: 0.8 %
Eosinophils Absolute: 51 cells/uL (ref 15–500)
Eosinophils Relative: 0.7 %
HCT: 43.5 % (ref 35.0–45.0)
Hemoglobin: 15.3 g/dL (ref 11.7–15.5)
Lymphs Abs: 1869 cells/uL (ref 850–3900)
MCH: 32.7 pg (ref 27.0–33.0)
MCHC: 35.2 g/dL (ref 32.0–36.0)
MCV: 92.9 fL (ref 80.0–100.0)
MPV: 10.9 fL (ref 7.5–12.5)
Monocytes Relative: 6.7 %
Neutro Abs: 4833 cells/uL (ref 1500–7800)
Neutrophils Relative %: 66.2 %
Platelets: 210 10*3/uL (ref 140–400)
RBC: 4.68 10*6/uL (ref 3.80–5.10)
RDW: 12.9 % (ref 11.0–15.0)
Total Lymphocyte: 25.6 %
WBC: 7.3 10*3/uL (ref 3.8–10.8)

## 2020-05-06 LAB — HEPATITIS C ANTIBODY
Hepatitis C Ab: NONREACTIVE
SIGNAL TO CUT-OFF: 0.01 (ref <1.00)

## 2020-05-06 LAB — RPR: RPR Ser Ql: NONREACTIVE

## 2020-05-06 LAB — C. TRACHOMATIS/N. GONORRHOEAE RNA
C. trachomatis RNA, TMA: NOT DETECTED
N. gonorrhoeae RNA, TMA: NOT DETECTED

## 2020-05-06 LAB — HIV ANTIBODY (ROUTINE TESTING W REFLEX): HIV 1&2 Ab, 4th Generation: NONREACTIVE

## 2020-05-06 MED ORDER — MIRABEGRON ER 25 MG PO TB24: 25.0000 mg | ORAL_TABLET | Freq: Every day | ORAL | 2 refills | Status: DC

## 2020-05-06 NOTE — Telephone Encounter (Signed)
Received request from pharmacy for PA on Myrbetriq.  PA submitted.   Dx: N30.10- Interstitial Cystitis.   PA [id number] approved 05/06/2020- 05/06/2021.

## 2020-05-07 LAB — URINE CULTURE
MICRO NUMBER:: 11173224
SPECIMEN QUALITY:: ADEQUATE

## 2020-08-11 ENCOUNTER — Other Ambulatory Visit: Payer: Self-pay

## 2020-08-11 ENCOUNTER — Ambulatory Visit (HOSPITAL_COMMUNITY)
Admission: EM | Admit: 2020-08-11 | Discharge: 2020-08-11 | Disposition: A | Discharge status: Home / Self Care | Payer: Medicaid Other | Attending: Internal Medicine | Admitting: Internal Medicine

## 2020-08-11 DIAGNOSIS — B354 Tinea corporis: Secondary | ICD-10-CM | POA: Insufficient documentation

## 2020-08-11 DIAGNOSIS — G8929 Other chronic pain: Secondary | ICD-10-CM | POA: Insufficient documentation

## 2020-08-11 DIAGNOSIS — M62830 Muscle spasm of back: Secondary | ICD-10-CM | POA: Diagnosis not present

## 2020-08-11 DIAGNOSIS — E860 Dehydration: Secondary | ICD-10-CM | POA: Diagnosis not present

## 2020-08-11 DIAGNOSIS — R350 Frequency of micturition: Secondary | ICD-10-CM | POA: Insufficient documentation

## 2020-08-11 DIAGNOSIS — M545 Low back pain, unspecified: Secondary | ICD-10-CM | POA: Diagnosis not present

## 2020-08-11 DIAGNOSIS — R300 Dysuria: Secondary | ICD-10-CM | POA: Insufficient documentation

## 2020-08-11 DIAGNOSIS — R109 Unspecified abdominal pain: Secondary | ICD-10-CM | POA: Insufficient documentation

## 2020-08-11 LAB — POCT URINALYSIS DIPSTICK, ED / UC
Glucose, UA: NEGATIVE mg/dL
Hgb urine dipstick: NEGATIVE
Ketones, ur: NEGATIVE mg/dL
Leukocytes,Ua: NEGATIVE
Nitrite: NEGATIVE
Protein, ur: NEGATIVE mg/dL
Specific Gravity, Urine: >=1.03 (ref 1.005–1.030)
Urobilinogen, UA: 1 mg/dL (ref 0.0–1.0)
pH: 6 (ref 5.0–8.0)

## 2020-08-11 MED ORDER — KETOCONAZOLE 2 % EX CREA: 1.0000 "application " | TOPICAL_CREAM | Freq: Every day | CUTANEOUS | 0 refills | Status: DC

## 2020-08-11 MED ORDER — PHENAZOPYRIDINE HCL 200 MG PO TABS
200.0000 mg | ORAL_TABLET | Freq: Three times a day (TID) | ORAL | 0 refills | Status: DC | PRN
Start: 2020-08-11 — End: 2023-01-12

## 2020-08-11 MED ORDER — HYDROCORTISONE 1 % EX CREA
TOPICAL_CREAM | CUTANEOUS | 0 refills | Status: DC
Start: 2020-08-11 — End: 2023-01-12

## 2020-08-11 NOTE — ED Triage Notes (Signed)
Pt reports recent treatment for UTI and now has Sx's of the same . Pt also reports a rash on Rt cheek of buttocks . Pt has back and ABd pain.

## 2020-08-11 NOTE — Discharge Instructions (Addendum)
I will manage her rash for a fungal infection with the ketoconazole cream.  Apply this once a day for the next couple of weeks.  You can use hydrocortisone cream applying light layers twice a day as needed only.  This would be for itching and if you do not have any itching then you do not have to use the hydrocortisone cream.  Make sure you hydrate very well with plain water and a quantity of 64 ounces of water a day.  Please limit drinks that are considered urinary irritants such as soda, sweet tea, coffee, energy drinks, alcohol.  These can worsen your urinary and genital symptoms but also be the source of them.  I will let you know about your urine culture results through MyChart to see if we need to prescribe or change your antibiotics based off of those results. Do not use any nonsteroidal anti-inflammatories (NSAIDs) like ibuprofen, Motrin, naproxen, Aleve, etc. which are all available over-the-counter.  Please just use Tylenol at a dose of 500mg -650mg  once every 6 hours as needed for your aches, pains, fevers.

## 2020-08-11 NOTE — ED Provider Notes (Signed)
[place]   MRN: [id number] DOB: [birth year]  Subjective:   Susan Gross is a 40 y.o. female presenting for acute onset recurrent dysuria, urinary frequency and urinary urgency.  Symptoms are very uncomfortable and is worried about a urinary tract infection.  She has also had acute on chronic abdominal pain and acute on chronic back pain.  Denies falls, trauma, weakness, numbness or tingling, radicular symptoms.  Patient has a history of UTIs and interstitial cystitis, last episode was in November.  She is had imaging done for her back and has been negative.  She also has a history of endometriosis and fibromyalgia.  Has a history of an abdominal hysterectomy.  Admits that she does not hydrate very well with plain water, drinks coffee and energy drinks.  No current facility-administered medications for this encounter.  Current Outpatient Medications:  .  Carboxymethylcellulose Sodium (MOISTURIZING LUBRICANT EYE OP), Place 1-2 drops into both eyes 3 (three) times daily as needed (for dry eyes)., Disp: , Rfl:  .  metroNIDAZOLE (FLAGYL) 500 MG tablet, Take 1 tablet (500 mg total) by mouth 2 (two) times daily., Disp: 14 tablet, Rfl: 0 .  mirabegron ER (MYRBETRIQ) 25 MG TB24 tablet, Take 1 tablet (25 mg total) by mouth daily., Disp: 30 tablet, Rfl: 2 .  nitrofurantoin, macrocrystal-monohydrate, (MACROBID) 100 MG capsule, Take 1 capsule (100 mg total) by mouth 2 (two) times daily., Disp: 14 capsule, Rfl: 0   Allergies  Allergen Reactions  . Ciprofloxacin Nausea And Vomiting  . Hydrocodone Hives    Past Medical History:  Diagnosis Date  . Anemia   . Anxiety   . Cancer (HCC)    cervical  . Chronic back pain   . Chronic pelvic pain in female   . Depression   . Endometriosis   . Fibromyalgia   . Headache(784.0)   . History of kidney stones   . Interstitial cystitis   . Ovarian cyst   . Suicide attempt [facility])      Past Surgical History:  Procedure Laterality Date   . ABDOMINAL HYSTERECTOMY    . CESAREAN SECTION    . HERNIA REPAIR Left    LIH  . LAPAROSCOPIC SALPINGO OOPHERECTOMY Left 08/10/2016   Procedure: LAPAROSCOPIC SALPINGO OOPHORECTOMY;  Surgeon: Jonnie Kind, MD;  Location: [facility] ORS;  Service: Gynecology;  Laterality: Left;  possible open procedure    Family History  Problem Relation Age of Onset  . Hypertension Mother   . Hypertension Father   . Heart attack Father   . Muscular dystrophy Brother   . Cancer Paternal Grandfather   . Other Paternal Grandmother        bleeding ulcers  . Irritable bowel syndrome Paternal Grandmother   . Fibromyalgia Maternal Grandmother   . Cancer Maternal Grandfather   . Bipolar disorder Sister   . Bipolar disorder Sister     Social History   Tobacco Use  . Smoking status: Current Every Day Smoker    Packs/day: 1.00    Years: 15.00    Pack years: 15.00    Types: Cigarettes  . Smokeless tobacco: Never Used  Vaping Use  . Vaping Use: Never used  Substance Use Topics  . Alcohol use: No  . Drug use: Yes    Types: Marijuana    Comment: once a day    ROS   Objective:   Vitals: BP (!) 159/104 (BP Location: Right Arm)   Pulse 88   Temp 99 F (37.2  C) (Oral)   Resp 18   SpO2 98%   Physical Exam Constitutional:      General: She is not in acute distress.    Appearance: Normal appearance. She is well-developed and normal weight. She is not ill-appearing, toxic-appearing or diaphoretic.  HENT:     Head: Normocephalic and atraumatic.     Right Ear: External ear normal.     Left Ear: External ear normal.     Nose: Nose normal.     Mouth/Throat:     Mouth: Mucous membranes are moist.     Pharynx: Oropharynx is clear.  Eyes:     General: No scleral icterus.    Extraocular Movements: Extraocular movements intact.     Pupils: Pupils are equal, round, and reactive to light.  Cardiovascular:     Rate and Rhythm: Normal rate and regular rhythm.     Heart sounds: Normal heart sounds. No  murmur heard. No friction rub. No gallop.   Pulmonary:     Effort: Pulmonary effort is normal. No respiratory distress.     Breath sounds: Normal breath sounds. No stridor. No wheezing, rhonchi or rales.  Abdominal:     General: Bowel sounds are normal. There is no distension.     Palpations: Abdomen is soft. There is no mass.     Tenderness: There is no abdominal tenderness. There is no right CVA tenderness, left CVA tenderness, guarding or rebound.  Musculoskeletal:     Lumbar back: Spasms (along paraspinal muscles of lumbar region) and tenderness (along paraspinal muscles of lumbar region) present. No swelling, edema, deformity, signs of trauma, lacerations or bony tenderness. Normal range of motion. Negative right straight leg raise test and negative left straight leg raise test. No scoliosis.  Skin:    General: Skin is warm and dry.     Coloration: Skin is not pale.     Findings: No rash.          Comments: RN Luellen Pucker assisted as a chaperone.  Neurological:     General: No focal deficit present.     Mental Status: She is alert and oriented to person, place, and time.  Psychiatric:        Mood and Affect: Mood normal.        Behavior: Behavior normal.        Thought Content: Thought content normal.        Judgment: Judgment normal.     Results for orders placed or performed during the hospital encounter of 08/11/20 (from the past 24 hour(s))  POCT Urinalysis Dipstick (ED/UC)     Status: Abnormal   Collection Time: 08/11/20  3:44 PM  Result Value Ref Range   Glucose, UA NEGATIVE NEGATIVE mg/dL   Bilirubin Urine SMALL (A) NEGATIVE   Ketones, ur NEGATIVE NEGATIVE mg/dL   Specific Gravity, Urine >=1.030 1.005 - 1.030   Hgb urine dipstick NEGATIVE NEGATIVE   pH 6.0 5.0 - 8.0   Protein, ur NEGATIVE NEGATIVE mg/dL   Urobilinogen, UA 1.0 0.0 - 1.0 mg/dL   Nitrite NEGATIVE NEGATIVE   Leukocytes,Ua NEGATIVE NEGATIVE    Assessment and Plan :   PDMP not reviewed this  encounter.  1. Dehydration   2. Dysuria   3. Urinary frequency   4. Chronic bilateral low back pain without sciatica   5. Muscle spasm of back   6. Chronic abdominal pain   7. Tinea corporis     We will use supportive care for her urinary symptoms as  I suspect this is dehydration interstitial cystitis as supported by her urinalysis.  Urine culture is pending.  Emphasized need to cut back on urinary irritants including her coffee and energy drinks.  Suspect chronic abdominal and back pain.  No signs of acute abdomen on exam.  Patient had paraspinal muscle tenderness but also reported relief with applying pressure.  Recommended conservative management with Tylenol and muscle relaxants.  Counseled on appropriate back care.  For her tinea will use ketoconazole cream for 2 weeks, hydrocortisone as needed for the itching. Counseled patient on potential for adverse effects with medications prescribed/recommended today, ER and return-to-clinic precautions discussed, patient verbalized understanding.    Jaynee Eagles, [place] 08/11/20 1623

## 2020-08-13 LAB — URINE CULTURE: Culture: 20000 — AB

## 2021-03-26 ENCOUNTER — Emergency Department (HOSPITAL_COMMUNITY): Payer: Medicaid Other

## 2021-03-26 ENCOUNTER — Other Ambulatory Visit: Payer: Self-pay

## 2021-03-26 ENCOUNTER — Emergency Department (HOSPITAL_COMMUNITY)
Admission: EM | Admit: 2021-03-26 | Discharge: 2021-03-27 | Disposition: A | Discharge status: Home / Self Care | Payer: Medicaid Other | Attending: Student | Admitting: Student

## 2021-03-26 ENCOUNTER — Encounter (HOSPITAL_COMMUNITY): Payer: Self-pay

## 2021-03-26 DIAGNOSIS — M25512 Pain in left shoulder: Secondary | ICD-10-CM | POA: Diagnosis not present

## 2021-03-26 DIAGNOSIS — S299XXA Unspecified injury of thorax, initial encounter: Secondary | ICD-10-CM | POA: Diagnosis present

## 2021-03-26 DIAGNOSIS — E876 Hypokalemia: Secondary | ICD-10-CM | POA: Diagnosis not present

## 2021-03-26 DIAGNOSIS — S29011A Strain of muscle and tendon of front wall of thorax, initial encounter: Secondary | ICD-10-CM | POA: Diagnosis not present

## 2021-03-26 DIAGNOSIS — Z8541 Personal history of malignant neoplasm of cervix uteri: Secondary | ICD-10-CM | POA: Insufficient documentation

## 2021-03-26 DIAGNOSIS — R42 Dizziness and giddiness: Secondary | ICD-10-CM | POA: Diagnosis not present

## 2021-03-26 DIAGNOSIS — Z859 Personal history of malignant neoplasm, unspecified: Secondary | ICD-10-CM | POA: Diagnosis not present

## 2021-03-26 DIAGNOSIS — R0789 Other chest pain: Secondary | ICD-10-CM | POA: Insufficient documentation

## 2021-03-26 DIAGNOSIS — M25511 Pain in right shoulder: Secondary | ICD-10-CM | POA: Diagnosis not present

## 2021-03-26 DIAGNOSIS — S46911A Strain of unspecified muscle, fascia and tendon at shoulder and upper arm level, right arm, initial encounter: Secondary | ICD-10-CM | POA: Diagnosis not present

## 2021-03-26 DIAGNOSIS — X58XXXA Exposure to other specified factors, initial encounter: Secondary | ICD-10-CM | POA: Diagnosis not present

## 2021-03-26 DIAGNOSIS — I2699 Other pulmonary embolism without acute cor pulmonale: Secondary | ICD-10-CM | POA: Diagnosis not present

## 2021-03-26 DIAGNOSIS — F17210 Nicotine dependence, cigarettes, uncomplicated: Secondary | ICD-10-CM | POA: Insufficient documentation

## 2021-03-26 DIAGNOSIS — R079 Chest pain, unspecified: Secondary | ICD-10-CM | POA: Diagnosis not present

## 2021-03-26 DIAGNOSIS — Y9341 Activity, dancing: Secondary | ICD-10-CM | POA: Diagnosis not present

## 2021-03-26 LAB — BASIC METABOLIC PANEL
Anion gap: 8 (ref 5–15)
BUN: 8 mg/dL (ref 6–20)
CO2: 24 mmol/L (ref 22–32)
Calcium: 8.5 mg/dL — ABNORMAL LOW (ref 8.9–10.3)
Chloride: 103 mmol/L (ref 98–111)
Creatinine, Ser: 0.59 mg/dL (ref 0.44–1.00)
GFR, Estimated: >60 mL/min (ref >60)
Glucose, Bld: 93 mg/dL (ref 70–99)
Potassium: 3.1 mmol/L — ABNORMAL LOW (ref 3.5–5.1)
Sodium: 135 mmol/L (ref 135–145)

## 2021-03-26 LAB — CBC
HCT: 41.9 % (ref 36.0–46.0)
Hemoglobin: 14.4 g/dL (ref 12.0–15.0)
MCH: 32 pg (ref 26.0–34.0)
MCHC: 34.4 g/dL (ref 30.0–36.0)
MCV: 93.1 fL (ref 80.0–100.0)
Platelets: 219 10*3/uL (ref 150–400)
RBC: 4.5 MIL/uL (ref 3.87–5.11)
RDW: 13.2 % (ref 11.5–15.5)
WBC: 9.8 10*3/uL (ref 4.0–10.5)
nRBC: 0 % (ref 0.0–0.2)

## 2021-03-26 LAB — I-STAT CHEM 8, ED
BUN: 6 mg/dL (ref 6–20)
Calcium, Ion: 1.12 mmol/L — ABNORMAL LOW (ref 1.15–1.40)
Chloride: 104 mmol/L (ref 98–111)
Creatinine, Ser: 0.5 mg/dL (ref 0.44–1.00)
Glucose, Bld: 93 mg/dL (ref 70–99)
HCT: 41 % (ref 36.0–46.0)
Hemoglobin: 13.9 g/dL (ref 12.0–15.0)
Potassium: 3 mmol/L — ABNORMAL LOW (ref 3.5–5.1)
Sodium: 139 mmol/L (ref 135–145)
TCO2: 24 mmol/L (ref 22–32)

## 2021-03-26 LAB — POC URINE PREG, ED: Preg Test, Ur: NEGATIVE

## 2021-03-26 LAB — TROPONIN I (HIGH SENSITIVITY)
Troponin I (High Sensitivity): 3 ng/L (ref <18)
Troponin I (High Sensitivity): 3 ng/L (ref <18)

## 2021-03-26 MED ORDER — ACETAMINOPHEN 500 MG PO TABS
1000.0000 mg | ORAL_TABLET | Freq: Once | ORAL | Status: AC
  Administered 2021-03-26: 1000 mg via ORAL
  Filled 2021-03-26: qty 2

## 2021-03-26 MED ORDER — KETOROLAC TROMETHAMINE 30 MG/ML IJ SOLN
15.0000 mg | Freq: Once | INTRAMUSCULAR | Status: AC
  Administered 2021-03-26: 15 mg via INTRAVENOUS
  Filled 2021-03-26: qty 1

## 2021-03-26 MED ORDER — IOHEXOL 350 MG/ML SOLN
100.0000 mL | Freq: Once | INTRAVENOUS | Status: AC | PRN
  Administered 2021-03-26: 100 mL via INTRAVENOUS

## 2021-03-26 MED ORDER — LACTATED RINGERS IV BOLUS
1000.0000 mL | Freq: Once | INTRAVENOUS | Status: AC
  Administered 2021-03-26: 1000 mL via INTRAVENOUS

## 2021-03-26 NOTE — ED Triage Notes (Signed)
Pt presents to ED with c/o dizziness, chest pain, left shoulder/arm pain and back pain- all of these sx started at 5:30 per pt. Pt says she is disoriented, but is alert and oriented x 4.

## 2021-03-26 NOTE — ED Notes (Signed)
Patient transported to X-ray 

## 2021-03-26 NOTE — ED Notes (Signed)
Patient transported to CT 

## 2021-03-26 NOTE — ED Provider Notes (Addendum)
[facility] EMERGENCY DEPARTMENT Provider Note   CSN: [id number] Arrival date & time: 03/26/21  1940     History Chief Complaint  Patient presents with   Dizziness    Chest pain, back pain, left shoulder/arm pain    Susan Gross is a 40 y.o. female with PMH anxiety, cervical cancer status post hysterectomy, endometriosis who presents emergency department for evaluation of chest pain and back pain.  Patient states that the symptoms started at approximately 5:30 PM and have been getting progressively worse.  She states that the pain is a 10 out of 10 and shoots down the left arm.  Associated with nausea but no shortness of breath, no diaphoresis.  She states the pain is exertional, but mostly worse when she moves the left arm and shoulder.  The patient is a Tourist information centre manager, and frequently dances on a pole, catching herself while sliding down the pole.  She also endorses lightheadedness and dizziness that is worse when the pain intensifies.  Denies abdominal pain, vomiting, fever, cough or other systemic symptoms.   Dizziness Associated symptoms: chest pain   Associated symptoms: no palpitations, no shortness of breath and no vomiting       Past Medical History:  Diagnosis Date   Anemia    Anxiety    Cancer (HCC)    cervical   Chronic back pain    Chronic pelvic pain in female    Depression    Endometriosis    Fibromyalgia    Headache(784.0)    History of kidney stones    Interstitial cystitis    Ovarian cyst    Suicide attempt [facility])     Patient Active Problem List   Diagnosis Date Noted   LLQ pain 08/30/2018   Vaginal discharge 08/30/2018   BV (bacterial vaginosis) 08/30/2018   Bloating 08/30/2018   History of hernia repair 08/30/2018   History of endometriosis 08/30/2018   Lumbar pain 01/11/2018   Pain in left hip 12/28/2017   Encounter for postoperative wound check 08/19/2016   Postoperative pelvic peritoneal adhesions 08/10/2016   Cyst of left ovary 07/12/2016    Major depressive disorder, recurrent episode, moderate degree ([place]) 01/26/2016   Major depressive disorder, recurrent episode, moderate ([place]) 01/26/2016   Interstitial cystitis    Chronic back pain    Chronic pelvic pain in female    ANXIETY 02/21/2007   DEPRESSION 02/21/2007   DYSLEXIA 02/21/2007    Past Surgical History:  Procedure Laterality Date   ABDOMINAL HYSTERECTOMY     CESAREAN SECTION     HERNIA REPAIR Left    LIH   LAPAROSCOPIC SALPINGO OOPHERECTOMY Left 08/10/2016   Procedure: LAPAROSCOPIC SALPINGO OOPHORECTOMY;  Surgeon: Jonnie Kind, MD;  Location: [facility];  Service: Gynecology;  Laterality: Left;  possible open procedure     OB History     Gravida  3   Para  2   Term  2   Preterm      AB  1   Living  2      SAB  1   IAB      Ectopic      Multiple      Live Births              Family History  Problem Relation Age of Onset   Hypertension Mother    Hypertension Father    Heart attack Father    Muscular dystrophy Brother    Cancer Paternal Grandfather    Other Paternal  Grandmother        bleeding ulcers   Irritable bowel syndrome Paternal Grandmother    Fibromyalgia Maternal Grandmother    Cancer Maternal Grandfather    Bipolar disorder Sister    Bipolar disorder Sister     Social History   Tobacco Use   Smoking status: Every Day    Packs/day: 1.00    Years: 15.00    Pack years: 15.00    Types: Cigarettes   Smokeless tobacco: Never  Vaping Use   Vaping Use: Never used  Substance Use Topics   Alcohol use: No   Drug use: Yes    Types: Marijuana    Comment: once a day    Home Medications Prior to Admission medications   Medication Sig Start Date End Date Taking? Authorizing Provider  Carboxymethylcellulose Sodium (MOISTURIZING LUBRICANT EYE OP) Place 1-2 drops into both eyes 3 (three) times daily as needed (for dry eyes).    [provider]  hydrocortisone cream 1 % Apply to affected area 2 times daily 08/11/20    Jaynee Eagles, PA-C  ketoconazole (NIZORAL) 2 % cream Apply 1 application topically daily. 08/11/20   Jaynee Eagles, PA-C  metroNIDAZOLE (FLAGYL) 500 MG tablet Take 1 tablet (500 mg total) by mouth 2 (two) times daily. 05/05/20   [place], Modena Nunnery, MD  mirabegron ER (MYRBETRIQ) 25 MG TB24 tablet Take 1 tablet (25 mg total) by mouth daily. 05/06/20   [place], Modena Nunnery, MD  nitrofurantoin, macrocrystal-monohydrate, (MACROBID) 100 MG capsule Take 1 capsule (100 mg total) by mouth 2 (two) times daily. 05/05/20   [place], Modena Nunnery, MD  phenazopyridine (PYRIDIUM) 200 MG tablet Take 1 tablet (200 mg total) by mouth 3 (three) times daily as needed for pain. 08/11/20   Jaynee Eagles, PA-C    Allergies    Ciprofloxacin and Hydrocodone  Review of Systems   Review of Systems  Constitutional:  Negative for chills and fever.  HENT:  Negative for ear pain and sore throat.   Eyes:  Negative for pain and visual disturbance.  Respiratory:  Negative for cough and shortness of breath.   Cardiovascular:  Positive for chest pain. Negative for palpitations.  Gastrointestinal:  Negative for abdominal pain and vomiting.  Genitourinary:  Negative for dysuria and hematuria.  Musculoskeletal:  Negative for arthralgias and back pain.  Skin:  Negative for color change and rash.  Neurological:  Positive for dizziness. Negative for seizures and syncope.  All other systems reviewed and are negative.  Physical Exam Updated Vital Signs BP 140/89   Pulse 78   Temp 98.8 F (37.1 C)   Resp 15   Ht 5\' 1"  (1.549 m)   Wt 45.6 kg   SpO2 98%   BMI 18.99 kg/m   Physical Exam Vitals and nursing note reviewed.  Constitutional:      General: She is not in acute distress.    Appearance: She is well-developed.  HENT:     Head: Normocephalic and atraumatic.  Eyes:     Conjunctiva/sclera: Conjunctivae normal.  Cardiovascular:     Rate and Rhythm: Normal rate and regular rhythm.     Heart sounds: No murmur heard. Pulmonary:      Effort: Pulmonary effort is normal. No respiratory distress.     Breath sounds: Normal breath sounds.  Abdominal:     Palpations: Abdomen is soft.     Tenderness: There is no abdominal tenderness.  Musculoskeletal:        General: Tenderness (Left pectoral muscle,  left shoulder) present.     Cervical back: Neck supple.  Skin:    General: Skin is warm and dry.  Neurological:     Mental Status: She is alert.    ED Results / Procedures / Treatments   Labs (all labs ordered are listed, but only abnormal results are displayed) Labs Reviewed  BASIC METABOLIC PANEL - Abnormal; Notable for the following components:      Result Value   Potassium 3.1 (*)    Calcium 8.5 (*)    All other components within normal limits  I-STAT CHEM 8, ED - Abnormal; Notable for the following components:   Potassium 3.0 (*)    Calcium, Ion 1.12 (*)    All other components within normal limits  CBC  POC URINE PREG, ED  I-STAT BETA HCG BLOOD, ED ([facility], [facility], [facility] ONLY)  TROPONIN I (HIGH SENSITIVITY)  TROPONIN I (HIGH SENSITIVITY)    EKG None  Radiology DG Chest 2 View  Result Date: 03/26/2021 CLINICAL DATA:  Chest pain EXAM: CHEST - 2 VIEW COMPARISON:  02/12/2020 FINDINGS: The heart size and mediastinal contours are within normal limits. Both lungs are clear. The visualized skeletal structures are unremarkable. IMPRESSION: No active cardiopulmonary disease. Electronically Signed   By: Donavan Foil M.D.   On: 03/26/2021 20:36   CT Angio Chest PE W and/or Wo Contrast  Result Date: 03/26/2021 CLINICAL DATA:  Dizziness and chest pain. EXAM: CT ANGIOGRAPHY CHEST WITH CONTRAST TECHNIQUE: Multidetector CT imaging of the chest was performed using the standard protocol during bolus administration of intravenous contrast. Multiplanar CT image reconstructions and MIPs were obtained to evaluate the vascular anatomy. CONTRAST:  166mL OMNIPAQUE IOHEXOL 350 MG/ML SOLN COMPARISON:  None. FINDINGS: Cardiovascular:  Satisfactory opacification of the pulmonary arteries to the segmental level. No evidence of pulmonary embolism. Normal heart size. No pericardial effusion. Mediastinum/Nodes: No enlarged mediastinal, hilar, or axillary lymph nodes. Thyroid gland, trachea, and esophagus demonstrate no significant findings. Lungs/Pleura: Lungs are clear. No pleural effusion or pneumothorax. Upper Abdomen: No acute abnormality. Musculoskeletal: No chest wall abnormality. No acute or significant osseous findings. Review of the MIP images confirms the above findings. IMPRESSION: Negative examination for pulmonary embolism or acute cardiopulmonary disease. Electronically Signed   By: Virgina Norfolk M.D.   On: 03/26/2021 22:46   DG Shoulder Left  Result Date: 03/26/2021 CLINICAL DATA:  Left shoulder pain. EXAM: LEFT SHOULDER - 2+ VIEW COMPARISON:  None. FINDINGS: There is no evidence of fracture or dislocation. There is no evidence of arthropathy or other focal bone abnormality. Soft tissues are unremarkable. IMPRESSION: Negative. Electronically Signed   By: Virgina Norfolk M.D.   On: 03/26/2021 23:50    Procedures Procedures   Medications Ordered in ED Medications  lactated ringers bolus 1,000 mL (0 mLs Intravenous Stopped 03/26/21 2353)  acetaminophen (TYLENOL) tablet 1,000 mg (1,000 mg Oral Given 03/26/21 2206)  iohexol (OMNIPAQUE) 350 MG/ML injection 100 mL (100 mLs Intravenous Contrast Given 03/26/21 2236)  ketorolac (TORADOL) 30 MG/ML injection 15 mg (15 mg Intravenous Given 03/26/21 2304)    ED Course  I have reviewed the triage vital signs and the nursing notes.  Pertinent labs & imaging results that were available during my care of the patient were reviewed by me and considered in my medical decision making (see chart for details).    MDM Rules/Calculators/A&P  Patient seen emergency department for evaluation of chest pain and shoulder pain.  Physical exam reveals tenderness  over the left pectoral muscle is reproducible and pain in the shoulder and chest that worsens with range of motion of the shoulder.  Hawkins test positive.  Laboratory evaluation including high-sensitivity troponin unremarkable outside of mild hypokalemia.  A CT angio PE study was performed due to the patient's history of cervical cancer which was reassuringly negative for intrathoracic pathology or pulmonary embolism.  X-ray of the shoulder negative.  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  Toradol given and pain improved dramatically on reevaluation.  She was placed in a sling and was discharged with orthopedic follow-up.  She was given very strict return precautions of which she voiced understanding and she was discharged. Final Clinical Impression(s) / ED Diagnoses Final diagnoses:  Pectoralis muscle strain, initial encounter  Acute pain of right shoulder    Rx / DC Orders ED Discharge Orders     None        Roye Gustafson, Debe Coder, MD 03/27/21 0013    Teressa Lower, MD 03/27/21 [phone]

## 2021-03-30 ENCOUNTER — Telehealth: Payer: Self-pay

## 2021-03-30 NOTE — Telephone Encounter (Signed)
Transition Care Management Unsuccessful Follow-up Telephone Call  Date of discharge and from where:  03/27/2021-[place]  Attempts:  1st Attempt  Reason for unsuccessful TCM follow-up call:  Voice mail full

## 2021-03-31 NOTE — Telephone Encounter (Signed)
Transition Care Management Unsuccessful Follow-up Telephone Call  Date of discharge and from where:  09/30/202 form [facility]  Attempts:  2nd Attempt  Reason for unsuccessful TCM follow-up call:  Voice mail full

## 2021-04-01 NOTE — Telephone Encounter (Signed)
Transition Care Management Unsuccessful Follow-up Telephone Call  Date of discharge and from where:  03/27/2021 from [facility]  Attempts:  3rd Attempt  Reason for unsuccessful TCM follow-up call:  Unable to reach patient

## 2021-10-12 DIAGNOSIS — N200 Calculus of kidney: Secondary | ICD-10-CM | POA: Diagnosis not present

## 2021-10-12 DIAGNOSIS — N390 Urinary tract infection, site not specified: Secondary | ICD-10-CM | POA: Diagnosis not present

## 2021-10-12 DIAGNOSIS — Z8541 Personal history of malignant neoplasm of cervix uteri: Secondary | ICD-10-CM | POA: Diagnosis not present

## 2022-03-22 ENCOUNTER — Encounter (HOSPITAL_COMMUNITY): Payer: Self-pay | Admitting: Emergency Medicine

## 2022-03-22 ENCOUNTER — Emergency Department (HOSPITAL_COMMUNITY): Payer: Medicaid Other

## 2022-03-22 ENCOUNTER — Other Ambulatory Visit: Payer: Self-pay

## 2022-03-22 DIAGNOSIS — F17210 Nicotine dependence, cigarettes, uncomplicated: Secondary | ICD-10-CM | POA: Diagnosis not present

## 2022-03-22 DIAGNOSIS — R509 Fever, unspecified: Secondary | ICD-10-CM | POA: Diagnosis present

## 2022-03-22 DIAGNOSIS — J209 Acute bronchitis, unspecified: Secondary | ICD-10-CM | POA: Insufficient documentation

## 2022-03-22 LAB — CBC
HCT: 47.7 % — ABNORMAL HIGH (ref 36.0–46.0)
Hemoglobin: 16.6 g/dL — ABNORMAL HIGH (ref 12.0–15.0)
MCH: 32.4 pg (ref 26.0–34.0)
MCHC: 34.8 g/dL (ref 30.0–36.0)
MCV: 93.2 fL (ref 80.0–100.0)
Platelets: 227 10*3/uL (ref 150–400)
RBC: 5.12 MIL/uL — ABNORMAL HIGH (ref 3.87–5.11)
RDW: 12.5 % (ref 11.5–15.5)
WBC: 8.4 10*3/uL (ref 4.0–10.5)
nRBC: 0 % (ref 0.0–0.2)

## 2022-03-22 LAB — BASIC METABOLIC PANEL
Anion gap: 5 (ref 5–15)
BUN: 11 mg/dL (ref 6–20)
CO2: 27 mmol/L (ref 22–32)
Calcium: 9 mg/dL (ref 8.9–10.3)
Chloride: 105 mmol/L (ref 98–111)
Creatinine, Ser: 0.59 mg/dL (ref 0.44–1.00)
GFR, Estimated: >60 mL/min (ref >60)
Glucose, Bld: 98 mg/dL (ref 70–99)
Potassium: 3.6 mmol/L (ref 3.5–5.1)
Sodium: 137 mmol/L (ref 135–145)

## 2022-03-22 LAB — TROPONIN I (HIGH SENSITIVITY)
Troponin I (High Sensitivity): <2 ng/L (ref <18)
Troponin I (High Sensitivity): 2 ng/L (ref <18)

## 2022-03-22 NOTE — ED Triage Notes (Signed)
Pt to the ED with panic attacks and chest pain that radiates through her back.  The chest pain began last night and states she is coughing up green mucous.  Pt states she lost consciousness for a few seconds earlier at work.

## 2022-03-23 ENCOUNTER — Emergency Department (HOSPITAL_COMMUNITY): Payer: Medicaid Other

## 2022-03-23 ENCOUNTER — Emergency Department (HOSPITAL_COMMUNITY)
Admission: EM | Admit: 2022-03-23 | Discharge: 2022-03-23 | Disposition: A | Discharge status: Home / Self Care | Payer: Medicaid Other | Attending: Emergency Medicine | Admitting: Emergency Medicine

## 2022-03-23 DIAGNOSIS — J209 Acute bronchitis, unspecified: Secondary | ICD-10-CM

## 2022-03-23 LAB — I-STAT BETA HCG BLOOD, ED (MC, WL, AP ONLY): I-stat hCG, quantitative: <5 m[IU]/mL (ref <5)

## 2022-03-23 MED ORDER — AZITHROMYCIN 250 MG PO TABS
500.0000 mg | ORAL_TABLET | Freq: Once | ORAL | Status: AC
  Administered 2022-03-23: 500 mg via ORAL
  Filled 2022-03-23: qty 2

## 2022-03-23 MED ORDER — KETOROLAC TROMETHAMINE 15 MG/ML IJ SOLN
15.0000 mg | Freq: Once | INTRAMUSCULAR | Status: AC
  Administered 2022-03-23: 15 mg via INTRAVENOUS
  Filled 2022-03-23: qty 1

## 2022-03-23 MED ORDER — AMOXICILLIN 250 MG PO CAPS
500.0000 mg | ORAL_CAPSULE | Freq: Once | ORAL | Status: AC
  Administered 2022-03-23: 500 mg via ORAL
  Filled 2022-03-23: qty 2

## 2022-03-23 MED ORDER — IOHEXOL 350 MG/ML SOLN
100.0000 mL | Freq: Once | INTRAVENOUS | Status: AC | PRN
  Administered 2022-03-23: 100 mL via INTRAVENOUS

## 2022-03-23 MED ORDER — AZITHROMYCIN 250 MG PO TABS: ORAL_TABLET | ORAL | 0 refills | Status: DC

## 2022-03-23 MED ORDER — LACTATED RINGERS IV BOLUS
1000.0000 mL | Freq: Once | INTRAVENOUS | Status: AC
  Administered 2022-03-23: 1000 mL via INTRAVENOUS

## 2022-03-23 MED ORDER — AMOXICILLIN 500 MG PO CAPS: 500.0000 mg | ORAL_CAPSULE | Freq: Three times a day (TID) | ORAL | 0 refills | Status: AC

## 2022-03-23 NOTE — Discharge Instructions (Signed)
You were evaluated in the Emergency Department and after careful evaluation, we did not find any emergent condition requiring admission or further testing in the hospital.  Your exam/testing today is overall reassuring.  Symptoms seem to be due to bronchitis or pneumonia, suspect due to a bacterial infection.  Please take the amoxicillin and azithromycin antibiotics as directed starting on the morning of 9-27.  Please return to the Emergency Department if you experience any worsening of your condition.   Thank you for allowing [place] to be a part of your care.

## 2022-03-23 NOTE — ED Provider Notes (Signed)
[facility] Emergency Department Provider Note MRN:  [id number]  Arrival date & time: 03/23/22     Chief Complaint   Chest pain History of Present Illness   Susan Gross is a 41 y.o. year-old female with a history of cervical cancer presenting to the ED with chief complaint of chest pain.  Patient explains that she was diagnosed with the flu about 2 weeks ago.  Felt better briefly but now feels sick again.  Subjective fever, chest pain, shortness of breath.  Pain worse with deep breaths.  Denies leg pain or swelling.  Recent diagnosis of cervical cancer following with oncology through [facility] long.  Review of Systems  A thorough review of systems was obtained and all systems are negative except as noted in the HPI and PMH.   Patient's Health History    Past Medical History:  Diagnosis Date   Anemia    Anxiety    Cancer ([place])    cervical   Chronic back pain    Chronic pelvic pain in female    Depression    Endometriosis    Fibromyalgia    Headache(784.0)    History of kidney stones    Interstitial cystitis    Ovarian cyst    Suicide attempt [facility])     Past Surgical History:  Procedure Laterality Date   ABDOMINAL HYSTERECTOMY     CESAREAN SECTION     HERNIA REPAIR Left    LIH   LAPAROSCOPIC SALPINGO OOPHERECTOMY Left 08/10/2016   Procedure: LAPAROSCOPIC SALPINGO OOPHORECTOMY;  Surgeon: Jonnie Kind, MD;  Location: [facility];  Service: Gynecology;  Laterality: Left;  possible open procedure    Family History  Problem Relation Age of Onset   Hypertension Mother    Hypertension Father    Heart attack Father    Muscular dystrophy Brother    Cancer Paternal Grandfather    Other Paternal Grandmother        bleeding ulcers   Irritable bowel syndrome Paternal Grandmother    Fibromyalgia Maternal Grandmother    Cancer Maternal Grandfather    Bipolar disorder Sister    Bipolar disorder Sister     Social History   Socioeconomic History    Marital status: Divorced    Spouse name: Not on file   Number of children: 2   Years of education: 9   Highest education level: Not on file  Occupational History   Occupation: bartender  Tobacco Use   Smoking status: Every Day    Packs/day: 1.00    Years: 15.00    Total pack years: 15.00    Types: Cigarettes   Smokeless tobacco: Never  Vaping Use   Vaping Use: Never used  Substance and Sexual Activity   Alcohol use: No   Drug use: Yes    Types: Marijuana    Comment: once a day   Sexual activity: Yes    Partners: Male    Birth control/protection: Surgical    Comment: Hysterectomy  Other Topics Concern   Not on file  Social History Narrative   2 children   One story home   Right handed    Social Determinants of Health   Financial Resource Strain: Not on file  Food Insecurity: Not on file  Transportation Needs: Not on file  Physical Activity: Not on file  Stress: Not on file  Social Connections: Not on file  Intimate Partner Violence: Not on file     Physical Exam   Vitals:  03/23/22 0049 03/23/22 0130  BP: (!) 149/91 (!) 150/88  Pulse: 70 65  Resp: 14 12  Temp:    SpO2: 93% 95%    CONSTITUTIONAL: Well-appearing, NAD feels warm NEURO/PSYCH:  Alert and oriented x 3, no focal deficits EYES:  eyes equal and reactive ENT/NECK:  no LAD, no JVD CARDIO: Tachycardic rate, well-perfused, normal S1 and S2 PULM:  CTAB no wheezing or rhonchi GI/GU:  non-distended, non-tender MSK/SPINE:  No gross deformities, no edema SKIN:  no rash, atraumatic   *Additional and/or pertinent findings included in MDM below  Diagnostic and Interventional Summary    EKG Interpretation  Date/Time:  Monday March 22 2022 19:29:55 EDT Ventricular Rate:  100 PR Interval:  134 QRS Duration: 84 QT Interval:  342 QTC Calculation: 441 R Axis:   82 Text Interpretation: Normal sinus rhythm Normal ECG When compared with ECG of 26-Mar-2021 20:08, No significant change was found  Confirmed by Gerlene Fee [phone]) on 03/22/2022 11:37:08 PM       Labs Reviewed  CBC - Abnormal; Notable for the following components:      Result Value   RBC 5.12 (*)    Hemoglobin 16.6 (*)    HCT 47.7 (*)    All other components within normal limits  BASIC METABOLIC PANEL  I-STAT BETA HCG BLOOD, ED ([facility], [facility], [facility] ONLY)  TROPONIN I (HIGH SENSITIVITY)  TROPONIN I (HIGH SENSITIVITY)    CT Angio Chest PE W and/or Wo Contrast  Final Result    DG Chest 2 View  Final Result      Medications  amoxicillin (AMOXIL) capsule 500 mg (has no administration in time range)  azithromycin (ZITHROMAX) tablet 500 mg (has no administration in time range)  lactated ringers bolus 1,000 mL (1,000 mLs Intravenous New Bag/Given 03/23/22 0048)  ketorolac (TORADOL) 15 MG/ML injection 15 mg (15 mg Intravenous Given 03/23/22 0046)  iohexol (OMNIPAQUE) 350 MG/ML injection 100 mL (100 mLs Intravenous Contrast Given 03/23/22 0034)     Procedures  /  Critical Care Procedures  ED Course and Medical Decision Making  Initial Impression and Ddx Pleuritic chest pain, fever, shortness of breath.  Currently being worked up for cancer.  Differential diagnosis includes PE, secondary bacterial pneumonia.  Awaiting CTA chest.  Past medical/surgical history that increases complexity of ED encounter: History of cervical cancer  Interpretation of Diagnostics I personally reviewed the Chest Xray and my interpretation is as follows: No obvious opacity or pneumothorax  Labs reassuring with no significant blood count or electrolyte disturbance.  CT is without evidence of PE, there is evidence of bronchitis, suspect bacterial given the history and duration of illness.  Patient Reassessment and Ultimate Disposition/Management     Patient is resting comfortably in no acute distress, reassuring vital signs, appropriate for discharge.  Patient management required discussion with the following services or consulting groups:   None  Complexity of Problems Addressed Acute illness or injury that poses threat of life of bodily function  Additional Data Reviewed and Analyzed Further history obtained from: None  Additional Factors Impacting ED Encounter Risk Prescriptions and Consideration of hospitalization  Barth Kirks. Sedonia Small, MD [place] [email or web address]  Final Clinical Impressions(s) / ED Diagnoses     ICD-10-CM   1. Acute bronchitis, unspecified organism  J20.9       ED Discharge Orders          Ordered    amoxicillin (AMOXIL) 500 MG capsule  3 times daily  03/23/22 0211    azithromycin (ZITHROMAX) 250 MG tablet        03/23/22 0211             Discharge Instructions Discussed with and Provided to Patient:     Discharge Instructions      You were evaluated in the Emergency Department and after careful evaluation, we did not find any emergent condition requiring admission or further testing in the hospital.  Your exam/testing today is overall reassuring.  Symptoms seem to be due to bronchitis or pneumonia, suspect due to a bacterial infection.  Please take the amoxicillin and azithromycin antibiotics as directed starting on the morning of 9-27.  Please return to the Emergency Department if you experience any worsening of your condition.   Thank you for allowing [place] to be a part of your care.       Maudie Flakes, MD 03/23/22 [phone]

## 2023-01-12 ENCOUNTER — Encounter: Payer: Self-pay | Admitting: Family Medicine

## 2023-01-12 ENCOUNTER — Ambulatory Visit: Payer: Medicaid Other | Admitting: Family Medicine

## 2023-01-12 VITALS — BP 140/82 | HR 80 | Temp 97.8°F | Ht 61.0 in | Wt 104.0 lb

## 2023-01-12 DIAGNOSIS — R300 Dysuria: Secondary | ICD-10-CM | POA: Diagnosis not present

## 2023-01-12 DIAGNOSIS — R002 Palpitations: Secondary | ICD-10-CM | POA: Insufficient documentation

## 2023-01-12 DIAGNOSIS — R030 Elevated blood-pressure reading, without diagnosis of hypertension: Secondary | ICD-10-CM | POA: Diagnosis not present

## 2023-01-12 DIAGNOSIS — F331 Major depressive disorder, recurrent, moderate: Secondary | ICD-10-CM

## 2023-01-12 DIAGNOSIS — N12 Tubulo-interstitial nephritis, not specified as acute or chronic: Secondary | ICD-10-CM | POA: Insufficient documentation

## 2023-01-12 DIAGNOSIS — Z13220 Encounter for screening for lipoid disorders: Secondary | ICD-10-CM

## 2023-01-12 DIAGNOSIS — Z1159 Encounter for screening for other viral diseases: Secondary | ICD-10-CM

## 2023-01-12 DIAGNOSIS — N10 Acute tubulo-interstitial nephritis: Secondary | ICD-10-CM

## 2023-01-12 DIAGNOSIS — Z1329 Encounter for screening for other suspected endocrine disorder: Secondary | ICD-10-CM

## 2023-01-12 DIAGNOSIS — L989 Disorder of the skin and subcutaneous tissue, unspecified: Secondary | ICD-10-CM | POA: Diagnosis not present

## 2023-01-12 DIAGNOSIS — F411 Generalized anxiety disorder: Secondary | ICD-10-CM

## 2023-01-12 DIAGNOSIS — Z1231 Encounter for screening mammogram for malignant neoplasm of breast: Secondary | ICD-10-CM

## 2023-01-12 DIAGNOSIS — Z114 Encounter for screening for human immunodeficiency virus [HIV]: Secondary | ICD-10-CM

## 2023-01-12 LAB — URINALYSIS, ROUTINE W REFLEX MICROSCOPIC
Bilirubin Urine: NEGATIVE
Casts: NONE SEEN /LPF
Crystals: NONE SEEN /HPF
Glucose, UA: NEGATIVE
Hgb urine dipstick: NEGATIVE
Ketones, ur: NEGATIVE
Nitrite: NEGATIVE
Protein, ur: NEGATIVE
RBC / HPF: NONE SEEN /HPF (ref 0–2)
Specific Gravity, Urine: 1.02 (ref 1.001–1.035)
Yeast: NONE SEEN /HPF
pH: 6.5 (ref 5.0–8.0)

## 2023-01-12 LAB — MICROSCOPIC MESSAGE

## 2023-01-12 MED ORDER — SULFAMETHOXAZOLE-TRIMETHOPRIM 800-160 MG PO TABS: 1.0000 | ORAL_TABLET | Freq: Two times a day (BID) | ORAL | 0 refills | Status: DC

## 2023-01-12 MED ORDER — METRONIDAZOLE 500 MG PO TABS: 500.0000 mg | ORAL_TABLET | Freq: Two times a day (BID) | ORAL | 0 refills | Status: AC

## 2023-01-12 MED ORDER — NITROFURANTOIN MONOHYD MACRO 100 MG PO CAPS: 100.0000 mg | ORAL_CAPSULE | Freq: Two times a day (BID) | ORAL | 0 refills | Status: DC

## 2023-01-12 MED ORDER — ESCITALOPRAM OXALATE 10 MG PO TABS: 10.0000 mg | ORAL_TABLET | Freq: Every day | ORAL | 1 refills | Status: AC

## 2023-01-12 MED ORDER — VARENICLINE TARTRATE 0.5 MG PO TABS: ORAL_TABLET | ORAL | 0 refills | Status: AC

## 2023-01-12 NOTE — Assessment & Plan Note (Signed)
Start Lexapro.      08/22/2017    2:03 PM  Depression screen PHQ 2/9  Decreased Interest 2  Down, Depressed, Hopeless 1  PHQ - 2 Score 3  Altered sleeping 1  Tired, decreased energy 1  Change in appetite 1  Feeling bad or failure about yourself  1  Trouble concentrating 1  Moving slowly or fidgety/restless 0  Suicidal thoughts 0  PHQ-9 Score 8  Difficult doing work/chores Extremely dIfficult

## 2023-01-12 NOTE — Progress Notes (Signed)
New Patient Office Visit  Subjective    Patient ID: Susan Gross, female    DOB: 1981-05-20  Age: 42 y.o. MRN: [id number]  CC:  Chief Complaint  Patient presents with   Establish Care    Lower back pain internally, per pt believes it may be kidney infection Within the last 3wks per pt notice that she gets palpations and her bp goes up    HPI Susan Gross presents to establish care. Oriented to practice routines and expectations. She has not seen a PCP in 3 years. PMH as below. She has several concerns today to discuss including a possible kidney infection or stones. She reports 3-4 days of right sided low back pain that radiates to both legs, chills, body aches, dysuria, lower abdominal pain. She denies recent fall or injury, hematuria, vaginal discharge or bleeding  She also reports palpitations with shortness of breath, chest pressure, lightheadedness, nausea, blurred vision, and elevations in her BP for 3 weeks. Her BP readings have been 187/90s. This started 1.5 months and has happened 3 times a week. 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   Outpatient Encounter Medications as of 01/12/2023  Medication Sig   escitalopram (LEXAPRO) 10 MG tablet Take 1 tablet (10 mg total) by mouth daily.   metroNIDAZOLE (FLAGYL) 500 MG tablet Take 1 tablet (500 mg total) by mouth 2 (two) times daily for 7 days.   nitrofurantoin, macrocrystal-monohydrate, (MACROBID) 100 MG capsule Take 1 capsule (100 mg total) by mouth 2 (two) times daily.   varenicline (CHANTIX) 0.5 MG tablet Take 1 tablet (0.5 mg total) by mouth daily for 3 days, THEN 1 tablet (0.5 mg total) 2 (two) times daily for 4 days, THEN 2 tablets (1 mg total) 2 (two) times daily.   [DISCONTINUED] sulfamethoxazole-trimethoprim (BACTRIM DS) 800-160 MG tablet Take 1 tablet by mouth 2  (two) times daily for 14 days.   [DISCONTINUED] azithromycin (ZITHROMAX) 250 MG tablet Take 1 tablet once daily for 4 days. (Patient not taking: Reported on 01/12/2023)   [DISCONTINUED] Carboxymethylcellulose Sodium (MOISTURIZING LUBRICANT EYE OP) Place 1-2 drops into both eyes 3 (three) times daily as needed (for dry eyes). (Patient not taking: Reported on 01/12/2023)   [DISCONTINUED] hydrocortisone cream 1 % Apply to affected area 2 times daily (Patient not taking: Reported on 01/12/2023)   [DISCONTINUED] ketoconazole (NIZORAL) 2 % cream Apply 1 application topically daily. (Patient not taking: Reported on 01/12/2023)   [DISCONTINUED] mirabegron ER (MYRBETRIQ) 25 MG TB24 tablet Take 1 tablet (25 mg total) by mouth daily. (Patient not taking: Reported on 01/12/2023)   [DISCONTINUED] phenazopyridine (PYRIDIUM) 200 MG tablet Take 1 tablet (200 mg total) by mouth 3 (three) times daily as needed for pain. (Patient not taking: Reported on 01/12/2023)   No facility-administered encounter medications on file as of 01/12/2023.    Past Medical History:  Diagnosis Date   Anemia    Anxiety    Cancer (HCC)    cervical   Chronic back pain    Chronic pelvic pain in female    Depression    Endometriosis    Fibromyalgia    Headache(784.0)    History of kidney stones    Interstitial cystitis    Ovarian cyst    Suicide attempt [facility])     Past Surgical History:  Procedure Laterality Date   ABDOMINAL  HYSTERECTOMY     CESAREAN SECTION     HERNIA REPAIR Left    LIH   LAPAROSCOPIC SALPINGO OOPHERECTOMY Left 08/10/2016   Procedure: LAPAROSCOPIC SALPINGO OOPHORECTOMY;  Surgeon: Tilda Burrow, MD;  Location: [facility];  Service: Gynecology;  Laterality: Left;  possible open procedure    Family History  Problem Relation Age of Onset   Hypertension Mother    Hypertension Father    Heart attack Father    Muscular dystrophy Brother    Cancer Paternal Grandfather    Other Paternal Grandmother        bleeding  ulcers   Irritable bowel syndrome Paternal Grandmother    Fibromyalgia Maternal Grandmother    Cancer Maternal Grandfather    Bipolar disorder Sister    Bipolar disorder Sister     Social History   Socioeconomic History   Marital status: Divorced    Spouse name: Not on file   Number of children: 2   Years of education: 9   Highest education level: Not on file  Occupational History   Occupation: bartender  Tobacco Use   Smoking status: Every Day    Current packs/day: 1.00    Average packs/day: 1 pack/day for 15.0 years (15.0 ttl pk-yrs)    Types: Cigarettes   Smokeless tobacco: Never  Vaping Use   Vaping status: Never Used  Substance and Sexual Activity   Alcohol use: No   Drug use: Yes    Types: Marijuana    Comment: once a day   Sexual activity: Yes    Partners: Male    Birth control/protection: Surgical    Comment: Hysterectomy  Other Topics Concern   Not on file  Social History Narrative   2 children   One story home   Right handed    Social Determinants of Health   Financial Resource Strain: Not on file  Food Insecurity: Not on file  Transportation Needs: Not on file  Physical Activity: Not on file  Stress: Not on file  Social Connections: Not on file  Intimate Partner Violence: Not on file    Review of Systems  All other systems reviewed and are negative.       Objective    BP (!) 140/82   Pulse 80   Temp 97.8 F (36.6 C) (Oral)   Ht 5\' 1"  (1.549 m)   Wt 104 lb (47.2 kg)   SpO2 97%   BMI 19.65 kg/m     01/12/2023   10:45 AM 01/12/2023   10:42 AM 03/23/2022    2:00 AM  Vitals with BMI  Height  5\' 1"    Weight  104 lbs   BMI  19.66   Systolic 140 150 540  Diastolic 82 84 88  Pulse  80 65     Physical Exam Vitals and nursing note reviewed.  Constitutional:      Appearance: Normal appearance. She is normal weight.  HENT:     Head: Normocephalic and atraumatic.  Cardiovascular:     Rate and Rhythm: Normal rate and regular  rhythm.     Pulses: Normal pulses.     Heart sounds: Normal heart sounds.  Pulmonary:     Effort: Pulmonary effort is normal.     Breath sounds: Normal breath sounds.  Abdominal:     Tenderness: There is right CVA tenderness.  Skin:    General: Skin is warm and dry.  Neurological:     General: No focal deficit present.  Mental Status: She is alert and oriented to person, place, and time. Mental status is at baseline.  Psychiatric:        Mood and Affect: Mood normal.        Behavior: Behavior normal.        Thought Content: Thought content normal.        Judgment: Judgment normal.         Assessment & Plan:   Problem List Items Addressed This Visit     Generalized anxiety disorder    Patient reports uncontrolled anxiety today. Denies SI/HI. Discussed treatment options and she would like to start Lexapro 10mg  daily. Return to office in 4 weeks.       No data to display                Relevant Medications   escitalopram (LEXAPRO) 10 MG tablet   Major depressive disorder, recurrent episode, moderate (HCC)    Start Lexapro.      08/22/2017    2:03 PM  Depression screen PHQ 2/9  Decreased Interest 2  Down, Depressed, Hopeless 1  PHQ - 2 Score 3  Altered sleeping 1  Tired, decreased energy 1  Change in appetite 1  Feeling bad or failure about yourself  1  Trouble concentrating 1  Moving slowly or fidgety/restless 0  Suicidal thoughts 0  PHQ-9 Score 8  Difficult doing work/chores Extremely dIfficult         Relevant Medications   escitalopram (LEXAPRO) 10 MG tablet   Palpitations - Primary    She does endorse symptoms at the time she got an elevated blood pressure reading including palpitations, shortness of breath and lightheadedness. Will order zio patch and place cardiology referral Seek immediate medical care for chest pain, palpitations, shortness of breath, recurrent headaches, vision changes. CBC, CMP, TSH done today. EKG showed NSR. No symptoms  in office today.      Relevant Orders   EKG 12-Lead (Completed)   LONG TERM MONITOR (3-14 DAYS)   Ambulatory referral to Cardiology   Elevated blood pressure reading without diagnosis of hypertension    BP 140/82 today in office. She reports elevated readings at home on her watch. She does endorse symptoms at the time she got an elevated reading including palpitations, shortness of breath and lightheadedness. Will order zio patch and place cardiology referral given frequency of these episodes. I instructed her to monitor her BP in AM at home and bring those readings and her monitor to office at her next visit. If readings sustain >140/90 report to office sooner. Seek immediate medical care for chest pain, palpitations, shortness of breath, recurrent headaches, vision changes.      Relevant Orders   CBC with Differential/Platelet   COMPLETE METABOLIC PANEL WITH GFR   Lipid panel   TSH   Acute pyelonephritis    Urine dipstick shows positive for WBC's, positive for nitrates, and positive for leukocytes.  Micro exam: 10-20 WBC's per HPF and few+ bacteria. Trichomoniasis also observed on UA. Will treat with Flagyl 500mg  BID for 7 days and Macrobid 100mg  BID for 5 days. Instructed to return to office if symptoms persist or worsen.       Relevant Medications   metroNIDAZOLE (FLAGYL) 500 MG tablet   nitrofurantoin, macrocrystal-monohydrate, (MACROBID) 100 MG capsule   Lesion of skin of nose    She reports a lesion that has been present for many years that occasionally drains clear fluid. She mentioned this on her way out  of the office. Will refer to dermatology.      Relevant Orders   Ambulatory referral to Dermatology   Other Visit Diagnoses     Screening for hyperlipidemia       Relevant Orders   Lipid panel   Dysuria       Relevant Orders   Urinalysis, Routine w reflex microscopic (Completed)   Microscopic Message (Completed)   Screening for thyroid disorder       Relevant Orders    TSH   Screening for HIV (human immunodeficiency virus)       Relevant Orders   HIV Antibody (routine testing w rflx)   Need for hepatitis C screening test       Relevant Orders   Hepatitis C antibody   Encounter for screening mammogram for malignant neoplasm of breast       Relevant Orders   MM DIGITAL SCREENING BILATERAL       Return in about 4 weeks (around 02/09/2023) for anxiety/depression.   Park Meo, FNP

## 2023-01-12 NOTE — Patient Instructions (Signed)
 It was great to meet you today and I'm excited to have you join the [facility] practice. I hope you had a positive experience today! If you feel so inclined, please feel free to recommend our practice to friends and family. Mila Merry, FNP-C

## 2023-01-12 NOTE — Assessment & Plan Note (Signed)
She reports a lesion that has been present for many years that occasionally drains clear fluid. She mentioned this on her way out of the office. Will refer to dermatology.

## 2023-01-12 NOTE — Assessment & Plan Note (Signed)
She does endorse symptoms at the time she got an elevated blood pressure reading including palpitations, shortness of breath and lightheadedness. Will order zio patch and place cardiology referral Seek immediate medical care for chest pain, palpitations, shortness of breath, recurrent headaches, vision changes. CBC, CMP, TSH done today. EKG showed NSR. No symptoms in office today.

## 2023-01-12 NOTE — Assessment & Plan Note (Signed)
Patient reports uncontrolled anxiety today. Denies SI/HI. Discussed treatment options and she would like to start Lexapro 10mg  daily. Return to office in 4 weeks.       No data to display

## 2023-01-12 NOTE — Assessment & Plan Note (Signed)
Urine dipstick shows positive for WBC's, positive for nitrates, and positive for leukocytes.  Micro exam: 10-20 WBC's per HPF and few+ bacteria. Trichomoniasis also observed on UA. Will treat with Flagyl 500mg  BID for 7 days and Macrobid 100mg  BID for 5 days. Instructed to return to office if symptoms persist or worsen.

## 2023-01-12 NOTE — Assessment & Plan Note (Signed)
BP 140/82 today in office. She reports elevated readings at home on her watch. She does endorse symptoms at the time she got an elevated reading including palpitations, shortness of breath and lightheadedness. Will order zio patch and place cardiology referral given frequency of these episodes. I instructed her to monitor her BP in AM at home and bring those readings and her monitor to office at her next visit. If readings sustain >140/90 report to office sooner. Seek immediate medical care for chest pain, palpitations, shortness of breath, recurrent headaches, vision changes.

## 2023-01-13 LAB — COMPLETE METABOLIC PANEL WITH GFR
AG Ratio: 1.9 (calc) (ref 1.0–2.5)
ALT: 10 U/L (ref 6–29)
AST: 15 U/L (ref 10–30)
Albumin: 4.4 g/dL (ref 3.6–5.1)
Alkaline phosphatase (APISO): 64 U/L (ref 31–125)
BUN: 8 mg/dL (ref 7–25)
CO2: 24 mmol/L (ref 20–32)
Calcium: 9.3 mg/dL (ref 8.6–10.2)
Chloride: 104 mmol/L (ref 98–110)
Creat: 0.66 mg/dL (ref 0.50–0.99)
Globulin: 2.3 g/dL (calc) (ref 1.9–3.7)
Glucose, Bld: 82 mg/dL (ref 65–99)
Potassium: 3.7 mmol/L (ref 3.5–5.3)
Sodium: 141 mmol/L (ref 135–146)
Total Bilirubin: 0.3 mg/dL (ref 0.2–1.2)
Total Protein: 6.7 g/dL (ref 6.1–8.1)
eGFR: 113 mL/min/{1.73_m2} (ref >=60)

## 2023-01-13 LAB — TSH: TSH: 1.06 mIU/L

## 2023-01-13 LAB — CBC WITH DIFFERENTIAL/PLATELET
Absolute Monocytes: 432 cells/uL (ref 200–950)
Basophils Absolute: 63 cells/uL (ref 0–200)
Basophils Relative: 0.7 %
Eosinophils Absolute: 36 cells/uL (ref 15–500)
Eosinophils Relative: 0.4 %
HCT: 46.5 % — ABNORMAL HIGH (ref 35.0–45.0)
Hemoglobin: 16 g/dL — ABNORMAL HIGH (ref 11.7–15.5)
Lymphs Abs: 2142 cells/uL (ref 850–3900)
MCH: 31.8 pg (ref 27.0–33.0)
MCHC: 34.4 g/dL (ref 32.0–36.0)
MCV: 92.4 fL (ref 80.0–100.0)
MPV: 11 fL (ref 7.5–12.5)
Monocytes Relative: 4.8 %
Neutro Abs: 6327 cells/uL (ref 1500–7800)
Neutrophils Relative %: 70.3 %
Platelets: 255 10*3/uL (ref 140–400)
RBC: 5.03 10*6/uL (ref 3.80–5.10)
RDW: 12.5 % (ref 11.0–15.0)
Total Lymphocyte: 23.8 %
WBC: 9 10*3/uL (ref 3.8–10.8)

## 2023-01-13 LAB — LIPID PANEL
Cholesterol: 148 mg/dL (ref <200)
HDL: 51 mg/dL (ref >=50)
LDL Cholesterol (Calc): 81 mg/dL (calc)
Non-HDL Cholesterol (Calc): 97 mg/dL (calc) (ref <130)
Total CHOL/HDL Ratio: 2.9 (calc) (ref <5.0)
Triglycerides: 79 mg/dL (ref <150)

## 2023-01-13 LAB — HIV ANTIBODY (ROUTINE TESTING W REFLEX): HIV 1&2 Ab, 4th Generation: NONREACTIVE

## 2023-01-13 LAB — HEPATITIS C ANTIBODY: Hepatitis C Ab: NONREACTIVE

## 2023-01-18 ENCOUNTER — Ambulatory Visit
Admission: RE | Admit: 2023-01-18 | Discharge: 2023-01-18 | Disposition: A | Discharge status: Home / Self Care | Payer: Medicaid Other | Source: Ambulatory Visit | Attending: Family Medicine | Admitting: Family Medicine

## 2023-01-18 DIAGNOSIS — Z1231 Encounter for screening mammogram for malignant neoplasm of breast: Secondary | ICD-10-CM

## 2023-02-09 ENCOUNTER — Other Ambulatory Visit (INDEPENDENT_AMBULATORY_CARE_PROVIDER_SITE_OTHER): Payer: Medicaid Other

## 2023-02-09 ENCOUNTER — Ambulatory Visit: Payer: Medicaid Other | Admitting: Family Medicine

## 2023-02-09 ENCOUNTER — Encounter: Payer: Self-pay | Admitting: Family Medicine

## 2023-02-09 VITALS — BP 115/70 | HR 84 | Temp 97.8°F | Ht 61.0 in | Wt 102.0 lb

## 2023-02-09 DIAGNOSIS — F411 Generalized anxiety disorder: Secondary | ICD-10-CM | POA: Diagnosis not present

## 2023-02-09 DIAGNOSIS — R002 Palpitations: Secondary | ICD-10-CM

## 2023-02-09 DIAGNOSIS — Z23 Encounter for immunization: Secondary | ICD-10-CM | POA: Diagnosis not present

## 2023-02-09 DIAGNOSIS — R030 Elevated blood-pressure reading, without diagnosis of hypertension: Secondary | ICD-10-CM

## 2023-02-09 MED ORDER — BUSPIRONE HCL 7.5 MG PO TABS: 7.5000 mg | ORAL_TABLET | Freq: Two times a day (BID) | ORAL | 1 refills | Status: DC

## 2023-02-09 NOTE — Progress Notes (Signed)
Subjective:  HPI: Susan Gross is a 42 y.o. female presenting on 02/09/2023 for Follow-up (for anxiety/depression/)   HPI Patient is in today for anxiety and blood pressure follow-up.  She reports her anxiety is improved on Lexapro 10mg  daily but she still feels anxious and is feeling more tired during the day.  HYPERTENSION without Chronic Kidney Disease Hypertension status: better  Satisfied with current treatment?  Not treated Duration of hypertension:  undiagnosed BP monitoring frequency:  a few times a day BP range: will obtain, left at home BP medication side effects:   unmedicated Medication compliance:  unmedicated Previous BP meds:none Aspirin: no Recurrent headaches: no Visual changes: no Palpitations: yes Dyspnea: no Chest pain: no Lower extremity edema: no Dizzy/lightheaded: no   Review of Systems  All other systems reviewed and are negative.   Relevant past medical history reviewed and updated as indicated.   Past Medical History:  Diagnosis Date   Anemia    Anxiety    Cancer (HCC)    cervical   Chronic back pain    Chronic pelvic pain in female    Depression    Endometriosis    Fibromyalgia    Headache(784.0)    History of kidney stones    Interstitial cystitis    Ovarian cyst    Suicide attempt [facility])      Past Surgical History:  Procedure Laterality Date   ABDOMINAL HYSTERECTOMY     CESAREAN SECTION     HERNIA REPAIR Left    LIH   LAPAROSCOPIC SALPINGO OOPHERECTOMY Left 08/10/2016   Procedure: LAPAROSCOPIC SALPINGO OOPHORECTOMY;  Surgeon: Tilda Burrow, MD;  Location: [facility];  Service: Gynecology;  Laterality: Left;  possible open procedure    Allergies and medications reviewed and updated.   Current Outpatient Medications:    escitalopram (LEXAPRO) 10 MG tablet, Take 1 tablet (10 mg total) by mouth daily., Disp: 30 tablet, Rfl: 1   varenicline (CHANTIX) 0.5 MG tablet, Take 1 tablet (0.5 mg total) by mouth daily for 3 days, THEN  1 tablet (0.5 mg total) 2 (two) times daily for 4 days, THEN 2 tablets (1 mg total) 2 (two) times daily., Disp: 131 tablet, Rfl: 0  Allergies  Allergen Reactions   Ciprofloxacin Nausea And Vomiting   Hydrocodone Hives    Objective:   BP 115/70   Pulse 84   Temp 97.8 F (36.6 C) (Oral)   Ht 5\' 1"  (1.549 m)   Wt 102 lb (46.3 kg)   SpO2 100%   BMI 19.27 kg/m      02/09/2023    9:58 AM 01/12/2023   10:45 AM 01/12/2023   10:42 AM  Vitals with BMI  Height 5\' 1"   5\' 1"   Weight 102 lbs  104 lbs  BMI 19.28  19.66  Systolic 115 140 409  Diastolic 70 82 84  Pulse 84  80     Physical Exam Vitals and nursing note reviewed.  Constitutional:      Appearance: Normal appearance. She is normal weight.  HENT:     Head: Normocephalic and atraumatic.  Cardiovascular:     Rate and Rhythm: Normal rate and regular rhythm.     Pulses: Normal pulses.     Heart sounds: Normal heart sounds.  Pulmonary:     Effort: Pulmonary effort is normal.     Breath sounds: Normal breath sounds.  Skin:    General: Skin is warm and dry.  Neurological:     General: No  focal deficit present.     Mental Status: She is alert and oriented to person, place, and time. Mental status is at baseline.  Psychiatric:        Mood and Affect: Mood normal.        Behavior: Behavior normal.        Thought Content: Thought content normal.        Judgment: Judgment normal.     Assessment & Plan:  Generalized anxiety disorder Assessment & Plan: Improved with Lexapro 10mg  but anxiety still uncontrolled. Will start Buspar 7.5mg  BID. Denies SI/HI. GAD 13.     01/12/2023    4:46 PM  GAD 7 : Generalized Anxiety Score  Nervous, Anxious, on Edge 3  Control/stop worrying 1  Worry too much - different things 1  Trouble relaxing 1  Restless 1  Easily annoyed or irritable 1  Afraid - awful might happen 1  Total GAD 7 Score 9  Anxiety Difficulty Somewhat difficult       Elevated blood pressure reading without  diagnosis of hypertension Assessment & Plan: BP improved at todays visit. She has been checking her BP at home but did not bring records, she will report values to office. She denies chest pain, palpitations, shortness of breath, recurrent headaches, or vision changes. She has pending referrals to cardiology and for Zio patch. Phone number provided for scheduling.      Follow up plan: Return in about 4 weeks (around 03/09/2023) for anxiety/depression, hypertension.  Park Meo, FNP

## 2023-02-09 NOTE — Assessment & Plan Note (Signed)
BP improved at todays visit. She has been checking her BP at home but did not bring records, she will report values to office. She denies chest pain, palpitations, shortness of breath, recurrent headaches, or vision changes. She has pending referrals to cardiology and for Zio patch. Phone number provided for scheduling.

## 2023-02-09 NOTE — Assessment & Plan Note (Addendum)
Improved with Lexapro 10mg  but anxiety still uncontrolled. Will start Buspar 7.5mg  BID. Denies SI/HI. GAD 13.     01/12/2023    4:46 PM  GAD 7 : Generalized Anxiety Score  Nervous, Anxious, on Edge 3  Control/stop worrying 1  Worry too much - different things 1  Trouble relaxing 1  Restless 1  Easily annoyed or irritable 1  Afraid - awful might happen 1  Total GAD 7 Score 9  Anxiety Difficulty Somewhat difficult

## 2023-02-10 ENCOUNTER — Ambulatory Visit: Payer: Medicaid Other | Attending: Family Medicine

## 2023-02-10 DIAGNOSIS — R002 Palpitations: Secondary | ICD-10-CM

## 2023-02-10 NOTE — Progress Notes (Unsigned)
Enrolled patient for a 7 day Zio Xt monitor to be mailed to patients home  DOD to read

## 2023-02-10 NOTE — Addendum Note (Signed)
Addended by: Park Meo on: 02/10/2023 09:31 AM   Modules accepted: Orders

## 2023-02-24 ENCOUNTER — Emergency Department (HOSPITAL_COMMUNITY)
Admission: EM | Admit: 2023-02-24 | Discharge: 2023-02-25 | Discharge status: Against Medical Advice | Payer: Medicaid Other | Source: Home / Self Care

## 2023-02-24 NOTE — ED Notes (Signed)
Pt left [facility] due to long ED wait.

## 2023-03-09 ENCOUNTER — Ambulatory Visit: Payer: Medicaid Other | Admitting: Family Medicine

## 2023-03-22 ENCOUNTER — Encounter: Payer: Self-pay | Admitting: Family Medicine

## 2023-03-22 ENCOUNTER — Ambulatory Visit: Payer: Medicaid Other | Admitting: Family Medicine

## 2023-03-22 VITALS — BP 102/68 | HR 87 | Temp 98.1°F | Ht 61.0 in | Wt 102.0 lb

## 2023-03-22 DIAGNOSIS — Z113 Encounter for screening for infections with a predominantly sexual mode of transmission: Secondary | ICD-10-CM

## 2023-03-22 DIAGNOSIS — F411 Generalized anxiety disorder: Secondary | ICD-10-CM

## 2023-03-22 DIAGNOSIS — B9689 Other specified bacterial agents as the cause of diseases classified elsewhere: Secondary | ICD-10-CM | POA: Diagnosis not present

## 2023-03-22 DIAGNOSIS — N760 Acute vaginitis: Secondary | ICD-10-CM | POA: Diagnosis not present

## 2023-03-22 DIAGNOSIS — N898 Other specified noninflammatory disorders of vagina: Secondary | ICD-10-CM

## 2023-03-22 LAB — WET PREP FOR TRICH, YEAST, CLUE

## 2023-03-22 MED ORDER — METRONIDAZOLE 500 MG PO TABS: 500.0000 mg | ORAL_TABLET | Freq: Two times a day (BID) | ORAL | 0 refills | Status: AC

## 2023-03-22 MED ORDER — BUSPIRONE HCL 15 MG PO TABS: 15.0000 mg | ORAL_TABLET | Freq: Two times a day (BID) | ORAL | 0 refills | Status: AC

## 2023-03-22 NOTE — Assessment & Plan Note (Addendum)
Uncontrolled, increase Buspar 15mg  BID. Denies SI/HI. GAD 7. Follow up in 4 weeks.     03/22/2023   10:28 AM 02/09/2023    2:50 PM 01/12/2023    4:46 PM  GAD 7 : Generalized Anxiety Score  Nervous, Anxious, on Edge 1 2 3   Control/stop worrying 1 2 1   Worry too much - different things 1 2 1   Trouble relaxing 1 2 1   Restless 1 1 1   Easily annoyed or irritable 1 2 1   Afraid - awful might happen 1 2 1   Total GAD 7 Score 7 13 9   Anxiety Difficulty  Very difficult Somewhat difficult

## 2023-03-22 NOTE — Patient Instructions (Signed)
I am treating you for bacterial vaginosis.  This is NOT a sexually transmitted disease.  Bacterial vaginosis is an overgrowth of normal vaginal bacteria.  I have prescribed Metronidazole 500mg  for you to take 2 times a day for the next week.  DO NOT drink alcohol while taking this medication or you will become extremely ill.  Healthy vaginal hygiene practices   -  Avoid sleeper pajamas. Nightgowns allow air to circulate.  Sleep without underpants whenever possible.  -  Wear cotton underpants during the day. Double-rinse underwear after washing to avoid residual irritants. Do not use fabric softeners for underwear and swimsuits.  - Avoid tights, leotards, leggings, "skinny" jeans, and other tight-fitting clothing. Skirts and loose-fitting pants allow air to circulate.  - Avoid pantyliners.  Instead use tampons or cotton pads.  - Daily warm bathing is helpful:     - Soak in clean water (no soap) for 10 to 15 minutes. Adding vinegar or baking soda to the water has not been specifically studied and may not be better than clean water alone.      - Use soap to wash regions other than the genital area just before getting out of the tub. Limit use of any soap on genital areas. Use fragance-free soaps.     - Rinse the genital area well and gently pat dry.  Don't rub.  Hair dryer to assist with drying can be used only if on cool setting.     - Do not use bubble baths or perfumed soaps.  - Do not use any feminine sprays, douches or powders.  These contain chemicals that will irritate the skin.  - If the genital area is tender or swollen, cool compresses may relieve the discomfort. Unscented wet wipes can be used instead of toilet paper for wiping.   - Emollients, such as Vaseline, may help protect skin and can be applied to the irritated area.  - Always remember to wipe front-to-back after bowel movements. Pat dry after urination.  - Do not sit in wet swimsuits for long periods of time after  swimming

## 2023-03-22 NOTE — Progress Notes (Signed)
Subjective:  HPI: Susan Gross is a 42 y.o. female presenting on 2023/03/26 for Follow-up (F/u meds)   HPI Patient is in today for anxiety follow-up. She did stop taking Lexapro due to it making her feel more depressed. She tried half an Adderall of her daughters and reports this helped her symptoms. Buspar is also improving her symptoms. Denies SI/HI. She also reports a white discharge and vaginal burning. She denies itching, abnormal bleeding, dysuria, abdominal pain, flank pain, fevers.   ANXIETY/STRESS Duration:uncontrolled Anxious mood: yes  Excessive worrying: yes Irritability: yes  Sweating: no Nausea: no Palpitations:no Hyperventilation: no Panic attacks: no Agoraphobia: no  Obscessions/compulsions: no Depressed mood: yes    26-Mar-2023   10:27 AM 02/09/2023    2:48 PM 01/12/2023    4:45 PM 08/22/2017    2:03 PM  Depression screen PHQ 2/9  Decreased Interest 1 1 2 2   Down, Depressed, Hopeless 1 1 1 1   PHQ - 2 Score 2 2 3 3   Altered sleeping 2 1 1 1   Tired, decreased energy 1 2 2 1   Change in appetite 1 2 2 1   Feeling bad or failure about yourself  1 0 0 1  Trouble concentrating 2 3 3 1   Moving slowly or fidgety/restless 2 2 1  0  Suicidal thoughts 0 0 0 0  PHQ-9 Score 11 12 12 8   Difficult doing work/chores Somewhat difficult Very difficult Somewhat difficult Extremely dIfficult   Anhedonia: yes Weight changes: no Insomnia: yes hard to fall asleep  Hypersomnia: no Fatigue/loss of energy: yes Feelings of worthlessness: yes Feelings of guilt: yes Impaired concentration/indecisiveness: yes Suicidal ideations: no  Crying spells: no Recent Stressors/Life Changes: no   Relationship problems: yes   Family stress: no     Financial stress: no    Job stress: no    Recent death/loss: no     2023/03/26   10:28 AM 02/09/2023    2:50 PM 01/12/2023    4:46 PM  GAD 7 : Generalized Anxiety Score  Nervous, Anxious, on Edge 1 2 3   Control/stop worrying 1 2 1    Worry too much - different things 1 2 1   Trouble relaxing 1 2 1   Restless 1 1 1   Easily annoyed or irritable 1 2 1   Afraid - awful might happen 1 2 1   Total GAD 7 Score 7 13 9   Anxiety Difficulty  Very difficult Somewhat difficult      Review of Systems  All other systems reviewed and are negative.   Relevant past medical history reviewed and updated as indicated.   Past Medical History:  Diagnosis Date   Anemia    Anxiety    Cancer (HCC)    cervical   Chronic back pain    Chronic pelvic pain in female    Depression    Endometriosis    Fibromyalgia    Headache(784.0)    History of kidney stones    Interstitial cystitis    Ovarian cyst    Suicide attempt [facility])      Past Surgical History:  Procedure Laterality Date   ABDOMINAL HYSTERECTOMY     CESAREAN SECTION     HERNIA REPAIR Left    LIH   LAPAROSCOPIC SALPINGO OOPHERECTOMY Left 08/10/2016   Procedure: LAPAROSCOPIC SALPINGO OOPHORECTOMY;  Surgeon: Tilda Burrow, MD;  Location: [facility];  Service: Gynecology;  Laterality: Left;  possible open procedure    Allergies and medications reviewed and updated.   Current Outpatient Medications:  metroNIDAZOLE (FLAGYL) 500 MG tablet, Take 1 tablet (500 mg total) by mouth 2 (two) times daily for 7 days., Disp: 14 tablet, Rfl: 0   busPIRone (BUSPAR) 15 MG tablet, Take 1 tablet (15 mg total) by mouth 2 (two) times daily., Disp: 60 tablet, Rfl: 0   escitalopram (LEXAPRO) 10 MG tablet, Take 1 tablet (10 mg total) by mouth daily. (Patient not taking: Reported on 03/22/2023), Disp: 30 tablet, Rfl: 1  Allergies  Allergen Reactions   Ciprofloxacin Nausea And Vomiting   Hydrocodone Hives    Objective:   BP 102/68   Pulse 87   Temp 98.1 F (36.7 C) (Oral)   Ht 5\' 1"  (1.549 m)   Wt 102 lb (46.3 kg)   SpO2 98%   BMI 19.27 kg/m      03/22/2023   10:16 AM 02/09/2023    9:58 AM 01/12/2023   10:45 AM  Vitals with BMI  Height 5\' 1"  5\' 1"    Weight 102 lbs 102 lbs   BMI  19.28 19.28   Systolic 102 115 161  Diastolic 68 70 82  Pulse 87 84      Physical Exam Vitals and nursing note reviewed.  Constitutional:      Appearance: Normal appearance. She is normal weight.  HENT:     Head: Normocephalic and atraumatic.  Skin:    General: Skin is warm and dry.  Neurological:     General: No focal deficit present.     Mental Status: She is alert and oriented to person, place, and time. Mental status is at baseline.  Psychiatric:        Mood and Affect: Mood normal.        Behavior: Behavior normal.        Thought Content: Thought content normal.        Judgment: Judgment normal.     Assessment & Plan:  Generalized anxiety disorder Assessment & Plan: Uncontrolled, increase Buspar 15mg  BID. Denies SI/HI. GAD 7. Follow up in 4 weeks.     03/22/2023   10:28 AM 02/09/2023    2:50 PM 01/12/2023    4:46 PM  GAD 7 : Generalized Anxiety Score  Nervous, Anxious, on Edge 1 2 3   Control/stop worrying 1 2 1   Worry too much - different things 1 2 1   Trouble relaxing 1 2 1   Restless 1 1 1   Easily annoyed or irritable 1 2 1   Afraid - awful might happen 1 2 1   Total GAD 7 Score 7 13 9   Anxiety Difficulty  Very difficult Somewhat difficult       Routine screening for STI (sexually transmitted infection) -     WET PREP FOR TRICH, YEAST, CLUE  Vaginal discharge -     C. trachomatis/N. gonorrhoeae RNA -     HIV Antibody (routine testing w rflx) -     RPR  Bacterial vaginosis Assessment & Plan: Wet prep positive for clue cells and bacteria, she reports malodorous discharge and irritation. Will treat with Flagyl 500mg  BID for 7 days. Return to office if symptoms persist or worsen.    Other orders -     busPIRone HCl; Take 1 tablet (15 mg total) by mouth 2 (two) times daily.  Dispense: 60 tablet; Refill: 0 -     metroNIDAZOLE; Take 1 tablet (500 mg total) by mouth 2 (two) times daily for 7 days.  Dispense: 14 tablet; Refill: 0     Follow up plan:  Return in about  4 weeks (around 04/19/2023) for anxiety/depression.  Park Meo, FNP

## 2023-03-22 NOTE — Assessment & Plan Note (Signed)
Wet prep positive for clue cells and bacteria, she reports malodorous discharge and irritation. Will treat with Flagyl 500mg  BID for 7 days. Return to office if symptoms persist or worsen.

## 2023-03-23 ENCOUNTER — Ambulatory Visit: Payer: Medicaid Other

## 2023-03-24 LAB — C. TRACHOMATIS/N. GONORRHOEAE RNA
C. trachomatis RNA, TMA: NOT DETECTED
N. gonorrhoeae RNA, TMA: NOT DETECTED

## 2023-04-05 ENCOUNTER — Encounter: Payer: Self-pay | Admitting: Dermatology

## 2023-04-05 ENCOUNTER — Ambulatory Visit: Payer: Medicaid Other | Admitting: Dermatology

## 2023-04-05 VITALS — BP 128/84 | HR 86

## 2023-04-05 DIAGNOSIS — D2239 Melanocytic nevi of other parts of face: Secondary | ICD-10-CM | POA: Diagnosis not present

## 2023-04-05 DIAGNOSIS — D492 Neoplasm of unspecified behavior of bone, soft tissue, and skin: Secondary | ICD-10-CM | POA: Diagnosis not present

## 2023-04-05 DIAGNOSIS — C44311 Basal cell carcinoma of skin of nose: Secondary | ICD-10-CM | POA: Diagnosis not present

## 2023-04-05 DIAGNOSIS — D485 Neoplasm of uncertain behavior of skin: Secondary | ICD-10-CM

## 2023-04-05 DIAGNOSIS — Z808 Family history of malignant neoplasm of other organs or systems: Secondary | ICD-10-CM

## 2023-04-05 NOTE — Progress Notes (Signed)
   New Patient Visit   Subjective  Susan Gross is a 42 y.o. female who presents for the following: spot on nose  Pt has growth on the side of her nose for about 6 months that has grown in size and sometimes gets itchy. She also has a smaller spot on the left side of her nose that has been present for several months and bleeds.   The patient has spots, moles and lesions to be evaluated, some may be new or changing and the patient may have concern these could be cancer. Pt has no hx of skin cancer. Pt has family hx of skin cancer in mother and grandmother, possibly MM.  The following portions of the chart were reviewed this encounter and updated as appropriate: medications, allergies, medical history  Review of Systems:  No other skin or systemic complaints except as noted in HPI or Assessment and Plan.  Objective  Well appearing patient in no apparent distress; mood and affect are within normal limits.   A focused examination was performed of the following areas: face  Relevant exam findings are noted in the Assessment and Plan.  right nasal dorsum Pink papule       left nasal crease Pink papule        Assessment & Plan    Neoplasm of uncertain behavior of skin (2) right nasal dorsum  Skin / nail biopsy Type of biopsy: tangential   Instrument used: DermaBlade   Post-procedure details: wound care instructions given    Specimen 1 - Surgical pathology Differential Diagnosis: R/O NMSC  Check Margins: No  left nasal crease  Skin / nail biopsy Type of biopsy: tangential   Instrument used: DermaBlade   Post-procedure details: wound care instructions given    Specimen 2 - Surgical pathology Differential Diagnosis: R/O NMSC vs fibrous papule  Check Margins: No    Return in about 4 weeks (around 05/03/2023) for Mohs surgery and eventualy FBSE.  I, Tillie Fantasia, CMA, am acting as scribe for Gwenith Daily, MD.   Documentation: I have reviewed the  above documentation for accuracy and completeness, and I agree with the above.  Gwenith Daily, MD

## 2023-04-05 NOTE — Patient Instructions (Addendum)
Patient Handout: Wound Care for Skin Biopsy Site  Taking Care of Your Skin Biopsy Site  Proper care of the biopsy site is essential for promoting healing and minimizing scarring. This handout provides instructions on how to care for your biopsy site to ensure optimal recovery.  1. Cleaning the Wound:  Clean the biopsy site daily with gentle soap and water. Gently pat the area dry with a clean, soft towel. Avoid harsh scrubbing or rubbing the area, as this can irritate the skin and delay healing.  2. Applying Aquaphor and Bandage:  After cleaning the wound, apply a thin layer of Aquaphor ointment to the biopsy site. Cover the area with a sterile bandage to protect it from dirt, bacteria, and friction. Change the bandage daily or as needed if it becomes soiled or wet.  3. Continued Care for One Week:  Repeat the cleaning, Aquaphor application, and bandaging process daily for one week following the biopsy procedure. Keeping the wound clean and moist during this initial healing period will help prevent infection and promote optimal healing.  4. Massaging Aquaphor into the Area:  ---After one week, discontinue the use of bandages but continue to apply Aquaphor to the biopsy site. ----Gently massage the Aquaphor into the area using circular motions. ---Massaging the skin helps to promote circulation and prevent the formation of scar tissue.   Additional Tips:  Avoid exposing the biopsy site to direct sunlight during the healing process, as this can cause hyperpigmentation or worsen scarring. If you experience any signs of infection, such as increased redness, swelling, warmth, or drainage from the wound, contact your healthcare provider immediately. Follow any additional instructions provided by your healthcare provider for caring for the biopsy site and managing any discomfort. Conclusion:  Taking proper care of your skin biopsy site is crucial for ensuring optimal healing and  minimizing scarring. By following these instructions for cleaning, applying Aquaphor, and massaging the area, you can promote a smooth and successful recovery. If you have any questions or concerns about caring for your biopsy site, don't hesitate to contact your healthcare provider for guidance.    Skin Education : We  counseled the patient regarding the following: Sun screen (SPF 30 or greater) should be applied during peak UV exposure (between 10am and 2pm) and reapplied after exercise or swimming.  The ABCDEs of melanoma were reviewed with the patient, and the importance of monthly self-examination of moles was emphasized. Should any moles change in shape or color, or itch, bleed or burn, pt will contact our office for evaluation sooner then their interval appointment.  Plan: Sunscreen Recommendations We recommended a broad spectrum sunscreen with a SPF of 30 or higher.  SPF 30 sunscreens block approximately 97 percent of the sun's harmful rays. Sunscreens should be applied at least 15 minutes prior to expected sun exposure and then every 2 hours after that as long as sun exposure continues. If swimming or exercising sunscreen should be reapplied every 45 minutes to an hour after getting wet or sweating. One ounce, or the equivalent of a shot glass full of sunscreen, is adequate to protect the skin not covered by a bathing suit. We also recommended a lip balm with a sunscreen as well. Sun protective clothing can be used in lieu of sunscreen but must be worn the entire time you are exposed to the sun's rays. Important Information   Due to recent changes in healthcare laws, you may see results of your pathology and/or laboratory studies on MyChart before  the doctors have had a chance to review them. We understand that in some cases there may be results that are confusing or concerning to you. Please understand that not all results are received at the same time and often the doctors may need to interpret  multiple results in order to provide you with the best plan of care or course of treatment. Therefore, we ask that you please give [place] 2 business days to thoroughly review all your results before contacting the office for clarification. Should we see a critical lab result, you will be contacted sooner.     If You Need Anything After Your Visit   If you have any questions or concerns for your doctor, please call our main line at [phone]. If no one answers, please leave a voicemail as directed and we will return your call as soon as possible. Messages left after 4 pm will be answered the following business day.    You may also send [place] a message via MyChart. We typically respond to MyChart messages within 1-2 business days.  For prescription refills, please ask your pharmacy to contact our office. Our fax number is [phone].  If you have an urgent issue when the clinic is closed that cannot wait until the next business day, you can page your doctor at the number below.     Please note that while we do our best to be available for urgent issues outside of office hours, we are not available 24/7.    If you have an urgent issue and are unable to reach [place], you may choose to seek medical care at your doctor's office, retail clinic, urgent care center, or emergency room.   If you have a medical emergency, please immediately call 911 or go to the emergency department. In the event of inclement weather, please call our main line at [phone] for an update on the status of any delays or closures.  Dermatology Medication Tips: Please keep the boxes that topical medications come in in order to help keep track of the instructions about where and how to use these. Pharmacies typically print the medication instructions only on the boxes and not directly on the medication tubes.   If your medication is too expensive, please contact our office at [phone] or send [place] a message through MyChart.     We are unable to tell what your co-pay for medications will be in advance as this is different depending on your insurance coverage. However, we may be able to find a substitute medication at lower cost or fill out paperwork to get insurance to cover a needed medication.    If a prior authorization is required to get your medication covered by your insurance company, please allow [place] 1-2 business days to complete this process.   Drug prices often vary depending on where the prescription is filled and some pharmacies may offer cheaper prices.   The website www.goodrx.com contains coupons for medications through different pharmacies. The prices here do not account for what the cost may be with help from insurance (it may be cheaper with your insurance), but the website can give you the price if you did not use any insurance.  - You can print the associated coupon and take it with your prescription to the pharmacy.  - You may also stop by our office during regular business hours and pick up a GoodRx coupon card.  - If you need your prescription sent electronically to a different pharmacy, notify  our office through [facility] or by phone at [phone]

## 2023-04-06 LAB — SURGICAL PATHOLOGY

## 2023-04-15 ENCOUNTER — Encounter: Payer: Self-pay | Admitting: Internal Medicine

## 2023-04-15 ENCOUNTER — Ambulatory Visit: Payer: Medicaid Other | Attending: Internal Medicine | Admitting: Internal Medicine

## 2023-04-15 VITALS — BP 138/86 | HR 82 | Ht 62.0 in | Wt 104.0 lb

## 2023-04-15 DIAGNOSIS — R002 Palpitations: Secondary | ICD-10-CM | POA: Diagnosis not present

## 2023-04-15 NOTE — Progress Notes (Signed)
Cardiology Office Note  Date: 04/15/2023   ID: Susan Gross, DOB 1980/08/15, MRN [id number]  PCP:  Park Meo, FNP  Cardiologist:  Marjo Bicker, MD Electrophysiologist:  None   History of Present Illness: Susan Gross is a 42 y.o. female known to have anxiety/depression was referred to cardiology clinic for evaluation of palpitations.  Ongoing palpitations for the last 3 months, occurs almost daily, lasting for at least 5 to 6 minutes.  She was recently diagnosed with basal cell skin cancer and ovarian cyst.  She was taking caffeinated products but cut back significantly but still continue to see palpitations in the same frequency and intensity.  No improvement was noted.  She does have chest pain, SOB and dizziness during her anxiety attacks.  No syncope.  Past Medical History:  Diagnosis Date   Anemia    Anxiety    Cancer (HCC)    cervical   Chronic back pain    Chronic pelvic pain in female    Depression    Endometriosis    Fibromyalgia    Headache(784.0)    History of kidney stones    Interstitial cystitis    Ovarian cyst    Suicide attempt [facility])     Past Surgical History:  Procedure Laterality Date   ABDOMINAL HYSTERECTOMY     CESAREAN SECTION     HERNIA REPAIR Left    LIH   LAPAROSCOPIC SALPINGO OOPHERECTOMY Left 08/10/2016   Procedure: LAPAROSCOPIC SALPINGO OOPHORECTOMY;  Surgeon: Tilda Burrow, MD;  Location: [facility];  Service: Gynecology;  Laterality: Left;  possible open procedure    Current Outpatient Medications  Medication Sig Dispense Refill   busPIRone (BUSPAR) 15 MG tablet Take 1 tablet (15 mg total) by mouth 2 (two) times daily. (Patient taking differently: Take 30 mg by mouth 2 (two) times daily.) 60 tablet 0   escitalopram (LEXAPRO) 10 MG tablet Take 1 tablet (10 mg total) by mouth daily. 30 tablet 1   No current facility-administered medications for this visit.   Allergies:  Ciprofloxacin and Hydrocodone   Social History:  The patient  reports that she has been smoking cigarettes. She has a 15 pack-year smoking history. She has never used smokeless tobacco. She reports current drug use. Drug: Marijuana. She reports that she does not drink alcohol.   Family History: The patient's family history includes Bipolar disorder in her sister and sister; Cancer in her maternal grandfather and paternal grandfather; Fibromyalgia in her maternal grandmother; Heart attack in her father; Hypertension in her father and mother; Irritable bowel syndrome in her paternal grandmother; Muscular dystrophy in her brother; Other in her paternal grandmother.   ROS:  Please see the history of present illness. Otherwise, complete review of systems is positive for none.  All other systems are reviewed and negative.   Physical Exam: VS:  BP 138/86   Pulse 82   Ht 5\' 2"  (1.575 m)   Wt 104 lb (47.2 kg)   SpO2 96%   BMI 19.02 kg/m , BMI Body mass index is 19.02 kg/m.  Wt Readings from Last 3 Encounters:  04/15/23 104 lb (47.2 kg)  03/22/23 102 lb (46.3 kg)  02/09/23 102 lb (46.3 kg)    General: Patient appears comfortable at rest. HEENT: Conjunctiva and lids normal, oropharynx clear with moist mucosa. Neck: Supple, no elevated JVP or carotid bruits, no thyromegaly. Lungs: Clear to auscultation, nonlabored breathing at rest. Cardiac: Regular rate and rhythm, no S3 or significant systolic murmur,  no pericardial rub. Abdomen: Soft, nontender, no hepatomegaly, bowel sounds present, no guarding or rebound. Extremities: No pitting edema, distal pulses 2+. Skin: Warm and dry. Musculoskeletal: No kyphosis. Neuropsychiatric: Alert and oriented x3, affect grossly appropriate.  Recent Labwork: 01/12/2023: ALT 10; AST 15; BUN 8; Creat 0.66; Hemoglobin 16.0; Platelets 255; Potassium 3.7; Sodium 141; TSH 1.06     Component Value Date/Time   CHOL 148 01/12/2023 1126   TRIG 79 01/12/2023 1126   HDL 51 01/12/2023 1126   CHOLHDL 2.9 01/12/2023  1126   LDLCALC 81 01/12/2023 1126     Assessment and Plan:  Palpitations: Palpitations times few months, occurring almost daily, last for at least 5 to 6 minutes.  Cut back significantly on the caffeinated products but still continues to have palpitations.  Her PCP ordered 2-week event monitor but the monitor came off as it was not sticking to her chest wall, recommended her to call the toll-free number and get a new monitor.  She will need to call the clinic and make an appointment after her event monitor is resulted (as the monitor is ordered by her PCP).   I spent a total duration of 30 minutes reviewing her records, face-to-face discussion/counseling of her symptoms, pathophysiology, management, recommendation and documenting the findings in the note.    Medication Adjustments/Labs and Tests Ordered: Current medicines are reviewed at length with the patient today.  Concerns regarding medicines are outlined above.    Disposition:  Follow up (patient needs to make an appointment after her event monitor is resulted)  Signed, Kathren Scearce Verne Spurr, MD, 04/15/2023 3:15 PM    [facility] at [facility] [address] [place], [place] [address]

## 2023-04-15 NOTE — Patient Instructions (Signed)
Medication Instructions:  Your physician recommends that you continue on your current medications as directed. Please refer to the Current Medication list given to you today.  *If you need a refill on your cardiac medications before your next appointment, please call your pharmacy*   Lab Work: None If you have labs (blood work) drawn today and your tests are completely normal, you will receive your results only by: MyChart Message (if you have MyChart) OR A paper copy in the mail If you have any lab test that is abnormal or we need to change your treatment, we will call you to review the results.   Testing/Procedures: None   Follow-Up: At [facility], you and your health needs are our priority.  As part of our continuing mission to provide you with exceptional heart care, we have created designated Provider Care Teams.  These Care Teams include your primary Cardiologist (physician) and Advanced Practice Providers (APPs -  Physician Assistants and Nurse Practitioners) who all work together to provide you with the care you need, when you need it.  We recommend signing up for the patient portal called "MyChart".  Sign up information is provided on this After Visit Summary.  MyChart is used to connect with patients for Virtual Visits (Telemedicine).  Patients are able to view lab/test results, encounter notes, upcoming appointments, etc.  Non-urgent messages can be sent to your provider as well.   To learn more about what you can do with MyChart, go to [email or web address].    Your next appointment:    Call our office to make an appointment after your monitor is resulted.   Provider:   Luane School, MD    Other Instructions

## 2023-04-19 ENCOUNTER — Ambulatory Visit: Payer: Medicaid Other | Admitting: Family Medicine

## 2023-04-26 ENCOUNTER — Encounter: Payer: Self-pay | Admitting: Dermatology

## 2023-04-26 ENCOUNTER — Ambulatory Visit: Payer: Medicaid Other | Admitting: Dermatology

## 2023-04-26 VITALS — BP 125/80 | HR 80 | Temp 98.3°F

## 2023-04-26 DIAGNOSIS — L814 Other melanin hyperpigmentation: Secondary | ICD-10-CM

## 2023-04-26 DIAGNOSIS — C44311 Basal cell carcinoma of skin of nose: Secondary | ICD-10-CM

## 2023-04-26 DIAGNOSIS — W908XXA Exposure to other nonionizing radiation, initial encounter: Secondary | ICD-10-CM

## 2023-04-26 DIAGNOSIS — L579 Skin changes due to chronic exposure to nonionizing radiation, unspecified: Secondary | ICD-10-CM

## 2023-04-26 DIAGNOSIS — C4491 Basal cell carcinoma of skin, unspecified: Secondary | ICD-10-CM

## 2023-04-26 MED ORDER — MUPIROCIN 2 % EX OINT: 1.0000 | TOPICAL_OINTMENT | Freq: Two times a day (BID) | CUTANEOUS | 0 refills | Status: DC

## 2023-04-26 MED ORDER — OXYCODONE HCL 5 MG PO CAPS: 5.0000 mg | ORAL_CAPSULE | Freq: Four times a day (QID) | ORAL | 0 refills | Status: DC | PRN

## 2023-04-26 NOTE — Patient Instructions (Signed)

## 2023-04-26 NOTE — Progress Notes (Signed)
Follow-Up Visit   Subjective  Susan Gross is a 42 y.o. female who presents for the following: Mohs for nodular BCC of the right nasal dorsum  The following portions of the chart were reviewed this encounter and updated as appropriate: medications, allergies, medical history.  Review of Systems:  No other skin or systemic complaints except as noted in HPI or Assessment and Plan.  Objective  Well appearing patient in no apparent distress; mood and affect are within normal limits.  A focused examination was performed of the following areas: Right nasal dorsum Relevant physical exam findings are noted in the Assessment and Plan.   right nasal dorsum             Assessment & Plan   Basal cell carcinoma (BCC), unspecified site right nasal dorsum  Mohs surgery   Anticoagulation: Is the patient taking prescription anticoagulant and/or aspirin prescribed/recommended by a physician? No   Was the anticoagulation regimen changed prior to Mohs? No    Procedure Details: Surgical site (from skin exam): right nasal dorsum Pre-operative length (cm): 1.1 Pre-operative width (cm): 1.2  Micrographic Surgery Details: Post-operative length (cm): 1.2 Post-operative width (cm): 1.3 Number of Mohs stages: 1  Skin repair Complexity:  Complex Final length (cm):  2.8 Informed consent: discussed and consent obtained   Timeout: patient name, date of birth, surgical site, and procedure verified   Procedure prep:  Patient was prepped and draped in usual sterile fashion Prep type:  Chlorhexidine Anesthesia: the lesion was anesthetized in a standard fashion   Anesthetic:  1% lidocaine w/ epinephrine 1-100,000 local infiltration Reason for type of repair: reduce tension to allow closure, reduce the risk of dehiscence, infection, and necrosis, reduce subcutaneous dead space and avoid a hematoma, preserve normal anatomy, allow side-to-side closure without requiring a flap or graft and  compensate for the inelasticity of skin in this area   Undermining: area extensively undermined   Subcutaneous layers (deep stitches):  Suture size:  5-0 Suture type: Monocryl (poliglecaprone 25)   Stitches:  Buried vertical mattress Fine/surface layer approximation (top stitches):  Suture size:  5-0 Suture type: Prolene (polypropylene)   Stitches: simple running   Suture removal (days):  7 Hemostasis achieved with: suture, pressure and electrodesiccation Outcome: patient tolerated procedure well with no complications   Post-procedure details: sterile dressing applied and wound care instructions given    Related Medications oxycodone (OXY-IR) 5 MG capsule Take 1 capsule (5 mg total) by mouth every 6 (six) hours as needed for up to 8 doses.     Return in about 1 week (around 05/03/2023).  [name], CMA, am acting as scribe for Gwenith Daily, MD.    04/26/2023  HISTORY OF PRESENT ILLNESS  Susan Gross is seen in consultation at the request of Dr. Caralyn Guile for biopsy-proven Basal Cell Carcinoma of the right nasal dorsum. They note that the area has been present for about 1 year increasing in size with bleeding.  There is no history of previous treatment.  Reports no other new or changing lesions and has no other complaints today.  Medications and allergies: see patient chart.  Review of systems: Reviewed 8 systems and notable for the above skin cancer.  All other systems reviewed are unremarkable/negative, unless noted in the HPI. Past medical history, surgical history, family history, social history were also reviewed and are noted in the chart/questionnaire.    PHYSICAL EXAMINATION  General: Well-appearing, in no acute distress, alert and oriented x  4. Vitals reviewed in chart (if available).   Skin: Exam reveals a 1.2 x 1.1 cm erythematous papule and biopsy scar on the right nasal dorsum. There are rhytids, telangiectasias, and lentigines, consistent with  photodamage.  Biopsy report(s) reviewed, confirming the diagnosis.   ASSESSMENT  1) Basal Cell Carcinoma- Right nasal dorsum  2) photodamage 3) solar lentigines   PLAN   1. Due to location, size, histology, or recurrence and the likelihood of subclinical extension as well as the need to conserve normal surrounding tissue, the patient was deemed acceptable for Mohs micrographic surgery (MMS).  The nature and purpose of the procedure, associated benefits and risks including recurrence and scarring, possible complications such as pain, infection, and bleeding, and alternative methods of treatment if appropriate were discussed with the patient during consent. The lesion location was verified by the patient, by reviewing previous notes, pathology reports, and by photographs as well as angulation measurements if available.  Informed consent was reviewed and signed by the patient, and timeout was performed at 9:00 AM. See op note below.  2. For the photodamage and solar lentigines, sun protection discussed/information given on OTC sunscreens, and we recommend continued regular follow-up with primary dermatologist every 6 months or sooner for any growing, bleeding, or changing lesions. 3. Prognosis and future surveillance discussed. 4. Letter with treatment outcome sent to referring provider. 5. Pain acetaminophen/ibuprofen/oxycodone 5 mg   MOHS MICROGRAPHIC SURGERY AND RECONSTRUCTION  Initial size:   1.2 x 1.1 cm Surgical defect/wound size: 1.2 x 1.3 cm Anesthesia:    0.33% lidocaine with 1:200,000 epinephrine EBL:    <5 mL Complications:  None Repair type:   Complex  SQ suture:   5-0 Monocryl Cutaneous suture:  5-0 Prolene Final size of the repair: 2.8 cm  Stages: 1  STAGE I: Anesthesia achieved with 0.5% lidocaine with 1:200,000 epinephrine. ChloraPrep applied. 1 section(s) excised using Mohs technique (this includes total peripheral and deep tissue margin excision and evaluation with  frozen sections, excised and interpreted by the same physician). The tumor was first debulked and then excised with an approx. 2mm margin.  Hemostasis was achieved with electrocautery as needed.  The specimen was then oriented, subdivided/relaxed, inked, and processed using Mohs technique.    Frozen section analysis revealed a clear deep and peripheral margin.  Reconstruction  The surgical wound was then cleaned, prepped, and re-anesthetized as above. Wound edges were undermined extensively along at least one entire edge and at a distance equal to or greater than the width of the defect (see wound defect size above) in order to achieve closure and decrease wound tension and anatomic distortion. Redundant tissue repair including standing cone removal was performed. Hemostasis was achieved with electrocautery. Subcutaneous and epidermal tissues were approximated with the above sutures. The surgical site was then lightly scrubbed with sterile, saline-soaked gauze. Steri-strips were applied, and the area was then bandaged using Vaseline ointment, non-adherent gauze, gauze pads, and tape to provide an adequate pressure dressing. The patient tolerated the procedure well, was given detailed written and verbal wound care instructions, and was discharged in good condition.   The patient will follow-up: 7-10 days.   Documentation: I have reviewed the above documentation for accuracy and completeness, and I agree with the above.  Gwenith Daily, MD

## 2023-04-28 ENCOUNTER — Encounter: Payer: Self-pay | Admitting: Dermatology

## 2023-05-03 ENCOUNTER — Ambulatory Visit: Payer: Medicaid Other | Admitting: Dermatology

## 2023-05-03 ENCOUNTER — Encounter: Payer: Self-pay | Admitting: Dermatology

## 2023-05-03 DIAGNOSIS — Z85828 Personal history of other malignant neoplasm of skin: Secondary | ICD-10-CM

## 2023-05-03 DIAGNOSIS — Z48817 Encounter for surgical aftercare following surgery on the skin and subcutaneous tissue: Secondary | ICD-10-CM

## 2023-05-03 DIAGNOSIS — C4491 Basal cell carcinoma of skin, unspecified: Secondary | ICD-10-CM

## 2023-05-03 MED ORDER — MUPIROCIN 2 % EX OINT: 1.0000 | TOPICAL_OINTMENT | Freq: Two times a day (BID) | CUTANEOUS | 0 refills | Status: AC

## 2023-05-03 NOTE — Progress Notes (Signed)
   Follow-Up Visit   Subjective  Susan Gross is a 42 y.o. female who presents for the following: follow up to Mohs for [facility] on nose  Pt here today to follow up from Mohs surgery on her right nasal dorsum. She states spot is tender and looks "funky".  The following portions of the chart were reviewed this encounter and updated as appropriate: medications, allergies, medical history  Review of Systems:  No other skin or systemic complaints except as noted in HPI or Assessment and Plan.  Objective  Well appearing patient in no apparent distress; mood and affect are within normal limits.  A focused examination was performed of the following areas: Right nasal dorsum  Relevant exam findings are noted in the Assessment and Plan.    Assessment & Plan    Healing wound s/p Mohs surgery for BCC on nasal dorsum - Reassured in appropriate stage of healing based on tension of wound - Re-emphasized importance of thick mupirocin coverage with bandage - Demonstrated application of mupirocin with patient in the mirror, pt understands - Discussed that wound still needs to heal more - Will follow up in 1 week; sent in mupirocin refill - mupirocin ointment (BACTROBAN) 2 %; Apply 1 Application topically 2 (two) times daily.  Dispense: 22 g; Refill: 0   Return in about 1 week (around 05/10/2023).  I, Tillie Fantasia, CMA, am acting as scribe for Gwenith Daily, MD.   Documentation: I have reviewed the above documentation for accuracy and completeness, and I agree with the above.  Gwenith Daily, MD

## 2023-05-10 ENCOUNTER — Encounter: Payer: Self-pay | Admitting: Dermatology

## 2023-05-10 ENCOUNTER — Ambulatory Visit: Payer: Medicaid Other | Admitting: Dermatology

## 2023-05-10 VITALS — BP 138/87 | HR 79

## 2023-05-10 DIAGNOSIS — C4491 Basal cell carcinoma of skin, unspecified: Secondary | ICD-10-CM

## 2023-05-10 DIAGNOSIS — Z85828 Personal history of other malignant neoplasm of skin: Secondary | ICD-10-CM | POA: Diagnosis not present

## 2023-05-10 DIAGNOSIS — T8131XD Disruption of external operation (surgical) wound, not elsewhere classified, subsequent encounter: Secondary | ICD-10-CM

## 2023-05-10 NOTE — Progress Notes (Signed)
   Follow-Up Visit   Subjective  Susan Gross is a 42 y.o. female who presents for the follow up for [facility] Mohs on right nasal dorsum, treated on 04/26/2023.  Pt here for follow up from Mohs to right nasal dorsum. She endorses continued tenderness at the wound. She has been using mupirocin and covering the wound daily.   The following portions of the chart were reviewed this encounter and updated as appropriate: medications, allergies, medical history  Review of Systems:  No other skin or systemic complaints except as noted in HPI or Assessment and Plan.  Objective  Well appearing patient in no apparent distress; mood and affect are within normal limits.   A focused examination was performed of the following areas: Right nasal dorsum  Relevant exam findings are noted in the Assessment and Plan.    Assessment & Plan   Wound Check s/p Mohs for BCC on right nasal dorsum- not at treatment goal, outside of global period Exam: wound appears mildly dehisced on inferior aspect. Mild erythema and crusting - Soaked saline guaze applied - Wound debrided with pickups - Strata GRT wound healing gel applied and given to patient - Recommend start using Strata GRT gel daily to wound and covering with copious vaseline - Discussed that dehiscence related to active smoking status - Recommended reduce smoking if possible to increase chance of wound healing - Extensive counseling provided on wound healing and importance of moist environment  Treatment Plan: Continue wound care applying strata which pt received samples of    Return in about 1 week (around 05/17/2023) for wound check.  I, Tillie Fantasia, CMA, am acting as scribe for Gwenith Daily, MD.   Documentation: I have reviewed the above documentation for accuracy and completeness, and I agree with the above.  Gwenith Daily, MD

## 2023-05-10 NOTE — Patient Instructions (Signed)

## 2023-05-18 ENCOUNTER — Encounter: Payer: Self-pay | Admitting: Dermatology

## 2023-05-18 ENCOUNTER — Ambulatory Visit: Payer: Medicaid Other | Admitting: Dermatology

## 2023-05-18 DIAGNOSIS — Z48817 Encounter for surgical aftercare following surgery on the skin and subcutaneous tissue: Secondary | ICD-10-CM | POA: Diagnosis not present

## 2023-05-18 DIAGNOSIS — T8131XD Disruption of external operation (surgical) wound, not elsewhere classified, subsequent encounter: Secondary | ICD-10-CM

## 2023-05-18 DIAGNOSIS — Z85828 Personal history of other malignant neoplasm of skin: Secondary | ICD-10-CM

## 2023-05-18 DIAGNOSIS — C4491 Basal cell carcinoma of skin, unspecified: Secondary | ICD-10-CM

## 2023-05-18 NOTE — Patient Instructions (Signed)

## 2023-05-18 NOTE — Progress Notes (Signed)
   Follow-Up Visit   Subjective  Susan Gross is a 42 y.o. female who presents for the following: Follow up for Mohs to the nose Pt here for folllow up to Mohs. Pt c/o spot still being tender but otherwise doing well. She has been using the Strata Gel and Vaseline. She has also cut down on her smoking.   The following portions of the chart were reviewed this encounter and updated as appropriate: medications, allergies, medical history  Review of Systems:  No other skin or systemic complaints except as noted in HPI or Assessment and Plan.  Objective  Well appearing patient in no apparent distress; mood and affect are within normal limits.  A focused examination was performed of the following areas: nose  Relevant exam findings are noted in the Assessment and Plan.    Assessment & Plan   Wound check s/p Mohs Exam: wound had dehisced but healing well   Treatment Plan: Continue wound care as directed  Reassured that wound is healing well Continue vaseline daily under a bandage Praised patient on reducing smoking   Return in about 2 weeks (around 06/01/2023) for wound check.  I, Tillie Fantasia, CMA, am acting as scribe for Gwenith Daily, MD.   Documentation: I have reviewed the above documentation for accuracy and completeness, and I agree with the above.  Gwenith Daily, MD

## 2023-06-02 ENCOUNTER — Ambulatory Visit: Payer: Medicaid Other | Admitting: Dermatology

## 2023-06-02 ENCOUNTER — Encounter: Payer: Self-pay | Admitting: Dermatology

## 2023-06-02 VITALS — BP 129/84 | HR 89

## 2023-06-02 DIAGNOSIS — C4491 Basal cell carcinoma of skin, unspecified: Secondary | ICD-10-CM

## 2023-06-02 DIAGNOSIS — Z48817 Encounter for surgical aftercare following surgery on the skin and subcutaneous tissue: Secondary | ICD-10-CM

## 2023-06-02 DIAGNOSIS — Z85828 Personal history of other malignant neoplasm of skin: Secondary | ICD-10-CM | POA: Diagnosis not present

## 2023-06-02 DIAGNOSIS — T8131XD Disruption of external operation (surgical) wound, not elsewhere classified, subsequent encounter: Secondary | ICD-10-CM

## 2023-06-02 NOTE — Patient Instructions (Addendum)
 Important Information  Due to recent changes in healthcare laws, you may see results of your pathology and/or laboratory studies on MyChart before the doctors have had a chance to review them. We understand that in some cases there may be results that are confusing or concerning to you. Please understand that not all results are received at the same time and often the doctors may need to interpret multiple results in order to provide you with the best plan of care or course of treatment. Therefore, we ask that you please give [place] 2 business days to thoroughly review all your results before contacting the office for clarification. Should we see a critical lab result, you will be contacted sooner.   If You Need Anything After Your Visit  If you have any questions or concerns for your doctor, please call our main line at [phone] If no one answers, please leave a voicemail as directed and we will return your call as soon as possible. Messages left after 4 pm will be answered the following business day.   You may also send [place] a message via MyChart. We typically respond to MyChart messages within 1-2 business days.  For prescription refills, please ask your pharmacy to contact our office. Our fax number is [phone].  If you have an urgent issue when the clinic is closed that cannot wait until the next business day, you can page your doctor at the number below.    Please note that while we do our best to be available for urgent issues outside of office hours, we are not available 24/7.   If you have an urgent issue and are unable to reach [place], you may choose to seek medical care at your doctor's office, retail clinic, urgent care center, or emergency room.  If you have a medical emergency, please immediately call 911 or go to the emergency department. In the event of inclement weather, please call our main line at [phone] for an update on the status of any delays or  closures.  Dermatology Medication Tips: Please keep the boxes that topical medications come in in order to help keep track of the instructions about where and how to use these. Pharmacies typically print the medication instructions only on the boxes and not directly on the medication tubes.   If your medication is too expensive, please contact our office at [phone] or send [place] a message through MyChart.   We are unable to tell what your co-pay for medications will be in advance as this is different depending on your insurance coverage. However, we may be able to find a substitute medication at lower cost or fill out paperwork to get insurance to cover a needed medication.   If a prior authorization is required to get your medication covered by your insurance company, please allow [place] 1-2 business days to complete this process.  Drug prices often vary depending on where the prescription is filled and some pharmacies may offer cheaper prices.  The website www.goodrx.com contains coupons for medications through different pharmacies. The prices here do not account for what the cost may be with help from insurance (it may be cheaper with your insurance), but the website can give you the price if you did not use any insurance.  - You can print the associated coupon and take it with your prescription to the pharmacy.  - You may also stop by our office during regular business hours and pick up a GoodRx coupon card.  - If  you need your prescription sent electronically to a different pharmacy, notify our office through [facility] or by phone at [phone]    Skin Education :   I counseled the patient regarding the following: Sun screen (SPF 30 or greater) should be applied during peak UV exposure (between 10am and 2pm) and reapplied after exercise or swimming.  The ABCDEs of melanoma were reviewed with the patient, and the importance of monthly self-examination of moles was emphasized.  Should any moles change in shape or color, or itch, bleed or burn, pt will contact our office for evaluation sooner then their interval appointment.  Plan: Sunscreen Recommendations I recommended a broad spectrum sunscreen with a SPF of 30 or higher. I explained that SPF 30 sunscreens block approximately 97 percent of the sun's harmful rays. Sunscreens should be applied at least 15 minutes prior to expected sun exposure and then every 2 hours after that as long as sun exposure continues. If swimming or exercising sunscreen should be reapplied every 45 minutes to an hour after getting wet or sweating. One ounce, or the equivalent of a shot glass full of sunscreen, is adequate to protect the skin not covered by a bathing suit. I also recommended a lip balm with a sunscreen as well. Sun protective clothing can be used in lieu of sunscreen but must be worn the entire time you are exposed to the sun's rays.

## 2023-06-02 NOTE — Progress Notes (Signed)
   New Patient Visit   Subjective  KHRYSTYNA WILDRICK is a 42 y.o. female who presents for the following: follow up from Mohs surgery.  The patient presents for follow up from Mohs surgery for a BCC on the right nasal dorsum, treated on 04/26/23, repaired with complex linear closure. The patient has been bandaging the wound as directed. The endorse the following concerns: wound dehisced but is healing well. She has reduced her tobacco intake.  The following portions of the chart were reviewed this encounter and updated as appropriate: medications, allergies, medical history  Review of Systems:  No other skin or systemic complaints except as noted in HPI or Assessment and Plan.  Objective  Well appearing patient in no apparent distress; mood and affect are within normal limits.  A full examination was performed including scalp, head, and face. All findings within normal limits unless otherwise noted below.  Healing wound with mild erythema  Relevant physical exam findings are noted in the Assessment and Plan.   Assessment & Plan   Healing s/p Mohs for [facility], treated on 04/26/23, repaired with complex linear closure - Reassured that wound is healing well - No evidence of infection - No swelling, induration, purulence, dehiscence, or tenderness out of proportion to the clinical exam, see photo above - Discussed that scars take up to 12 months to mature from the date of surgery - Recommend SPF 30+ to scar daily to prevent purple color from UV exposure during scar maturation process - Discussed that erythema and raised appearance of scar will fade over the next 4-6 months - Can consider silicone based products for scar healing starting at 6 weeks post-op - Ok to continue ointment daily to wound under a bandage for another week.        Return in about 1 month (around 07/03/2023) for wound f/u.  Dominga Ferry, Surg Tech III, am acting as scribe for Gwenith Daily, MD.   Documentation: I  have reviewed the above documentation for accuracy and completeness, and I agree with the above.  Gwenith Daily, MD

## 2023-07-05 ENCOUNTER — Ambulatory Visit: Payer: Medicaid Other | Admitting: Dermatology

## 2023-07-05 NOTE — Progress Notes (Deleted)
   Follow Up Visit   Subjective  Susan Gross is a 43 y.o. female who presents for the following: follow up from Mohs surgery   The patient presents for follow up from Mohs surgery for a BCC on the right nasal dorsum, treated on 04/26/23, repaired with complex linear closure. The patient has been bandaging the wound as directed. The endorse the following concerns: ***   The following portions of the chart were reviewed this encounter and updated as appropriate: medications, allergies, medical history  Review of Systems:  No other skin or systemic complaints except as noted in HPI or Assessment and Plan.  Objective  Well appearing patient in no apparent distress; mood and affect are within normal limits.  A full examination was performed including scalp, head, and face. All findings within normal limits unless otherwise noted below.  Healing wound with mild erythema  Relevant physical exam findings are noted in the Assessment and Plan.    Assessment & Plan    Healing s/p Mohs for [facility], treated on 04/26/23, repaired with complex linear closure - Reassured that wound is healing well - No evidence of infection - No swelling, induration, purulence, dehiscence, or tenderness out of proportion to the clinical exam, see photo above - Discussed that scars take up to 12 months to mature from the date of surgery - Recommend SPF 30+ to scar daily to prevent purple color from UV exposure during scar maturation process - Discussed that erythema and raised appearance of scar will fade over the next 4-6 months - OK to start scar massage at 4-6 weeks post-op - Can consider silicone based products for scar healing starting at 6 weeks post-op - Ok to continue ointment daily to wound under a bandage for another ***     No follow-ups on file.  LILLETTE Berwyn Baseman, Surg Tech III, am acting as scribe for RUFUS CHRISTELLA HOLY, MD.   Documentation: I have reviewed the above documentation for accuracy and  completeness, and I agree with the above.  RUFUS CHRISTELLA HOLY, MD

## 2023-07-06 ENCOUNTER — Ambulatory Visit: Payer: Medicaid Other | Admitting: Dermatology

## 2023-09-30 ENCOUNTER — Ambulatory Visit: Payer: Self-pay

## 2023-09-30 NOTE — Telephone Encounter (Signed)
  Chief Complaint: Shortness of breath Symptoms: Cough, chest tightness, headache, dizziness,  Frequency: started 2 days ago Pertinent Negatives: Patient denies fever Disposition: [x] ED /[] Urgent Care (no appt availability in office) / [] Appointment(In office/virtual)/ []  [facility]/ [] Home Care/ [] Refused Recommended Disposition /[] [facility]/ []  Follow-up with PCP Additional Notes: patient called with concerns of shortness of breath (patient sounds short of breath on the phone), chest tightness, headache, and dizziness. Patient states symptoms started two days ago. Patient states shortness of breath is moderate. Per protocol, patient is recommended to the ED. Patient verbalized understanding and will be heading to the ED. All questions answered.       Copied from CRM [id number]. Topic: Clinical - Red Word Triage >> Sep 30, 2023 12:41 PM Fredrica W wrote: Red Word that prompted transfer to Nurse Triage: Short winded, can't catch breath, dizzy, headache Reason for Disposition  [1] MODERATE difficulty breathing (e.g., speaks in phrases, SOB even at rest, pulse 100-120) AND [2] NEW-onset or WORSE than normal  Answer Assessment - Initial Assessment Questions 1. RESPIRATORY STATUS: "Describe your breathing?" (e.g., wheezing, shortness of breath, unable to speak, severe coughing)      Shortness of breath 2. ONSET: "When did this breathing problem begin?"      Started 2 days ago 3. PATTERN "Does the difficult breathing come and go, or has it been constant since it started?"      constant 4. SEVERITY: "How bad is your breathing?" (e.g., mild, moderate, severe)    - MILD: No SOB at rest, mild SOB with walking, speaks normally in sentences, can lie down, no retractions, pulse < 100.    - MODERATE: SOB at rest, SOB with minimal exertion and prefers to sit, cannot lie down flat, speaks in phrases, mild retractions, audible wheezing, pulse 100-120.    - SEVERE: Very SOB at rest,  speaks in single words, struggling to breathe, sitting hunched forward, retractions, pulse > 120      Moderate 5. RECURRENT SYMPTOM: "Have you had difficulty breathing before?" If Yes, ask: "When was the last time?" and "What happened that time?"      no 6. CARDIAC HISTORY: "Do you have any history of heart disease?" (e.g., heart attack, angina, bypass surgery, angioplasty)      no 7. LUNG HISTORY: "Do you have any history of lung disease?"  (e.g., pulmonary embolus, asthma, emphysema)     no 8. CAUSE: "What do you think is causing the breathing problem?"      unsure 9. OTHER SYMPTOMS: "Do you have any other symptoms? (e.g., dizziness, runny nose, cough, chest pain, fever)     Cough, chest tightness, headache 10. O2 SATURATION MONITOR:  "Do you use an oxygen saturation monitor (pulse oximeter) at home?" If Yes, ask: "What is your reading (oxygen level) today?" "What is your usual oxygen saturation reading?" (e.g., 95%)       N/A 12. TRAVEL: "Have you traveled out of the country in the last month?" (e.g., travel history, exposures)       no  Protocols used: Breathing Difficulty-A-AH

## 2023-12-06 ENCOUNTER — Ambulatory Visit: Admitting: Dermatology

## 2023-12-06 ENCOUNTER — Ambulatory Visit: Payer: Medicaid Other | Admitting: Dermatology

## 2024-01-16 ENCOUNTER — Other Ambulatory Visit (HOSPITAL_COMMUNITY): Payer: Self-pay | Admitting: Family Medicine

## 2024-01-16 DIAGNOSIS — Z1231 Encounter for screening mammogram for malignant neoplasm of breast: Secondary | ICD-10-CM

## 2024-01-25 ENCOUNTER — Ambulatory Visit (HOSPITAL_COMMUNITY)

## 2024-03-15 ENCOUNTER — Other Ambulatory Visit: Payer: Self-pay | Admitting: Medical Genetics

## 2024-03-19 ENCOUNTER — Ambulatory Visit (HOSPITAL_COMMUNITY)
Admission: RE | Admit: 2024-03-19 | Discharge: 2024-03-19 | Disposition: A | Discharge status: Home / Self Care | Source: Ambulatory Visit | Attending: Family Medicine | Admitting: Family Medicine

## 2024-03-19 ENCOUNTER — Other Ambulatory Visit (HOSPITAL_COMMUNITY): Payer: Self-pay | Admitting: Family Medicine

## 2024-03-19 ENCOUNTER — Encounter (HOSPITAL_COMMUNITY): Payer: Self-pay

## 2024-03-19 DIAGNOSIS — Z1231 Encounter for screening mammogram for malignant neoplasm of breast: Secondary | ICD-10-CM | POA: Insufficient documentation

## 2024-03-19 DIAGNOSIS — N6310 Unspecified lump in the right breast, unspecified quadrant: Secondary | ICD-10-CM

## 2024-03-23 NOTE — Progress Notes (Deleted)
  Cardiology Office Note   Date:  03/23/2024  ID:  Susan Gross, DOB 1981-05-13, MRN [id number] PCP: Kayla Jeoffrey RAMAN, FNP  [facility] Providers Cardiologist:  Diannah SHAUNNA Maywood, MD   History of Present Illness Susan Gross is a 43 y.o. female with a past medical history of anxiety/depression who was referred to cardiology initially for evaluation of palpitations.  She was seen 04/15/2023 and had ongoing palpitations for the last 3 months.  They were occurring almost daily lasting for about 5 to 6 minutes.  Was diagnosed with basal cell skin cancer and ovarian cyst.  She was taking caffeinated products but cut back significantly but still continues to deal with palpitations and the same frequency and intensity.  No improvement was noted.  She did have chest pain, shortness of breath, and dizziness during her anxiety attacks.  No syncope.  Her primary care had ordered a 2-week event monitor but the monitor came off and was not sticking to her chest wall.  She was recommended to call to get a new monitor.  I do not currently see any monitor results to review on her chart.  Today, she***  ROS: Pertinent ROS in HPI  Studies Reviewed      None. Risk Assessment/Calculations {Does this patient have ATRIAL FIBRILLATION?:[phone]} No BP recorded.  {Refresh Note OR Click here to enter BP  :1}***       Physical Exam VS:  There were no vitals taken for this visit.       Wt Readings from Last 3 Encounters:  04/15/23 104 lb (47.2 kg)  03/22/23 102 lb (46.3 kg)  02/09/23 102 lb (46.3 kg)    GEN: Well nourished, well developed in no acute distress NECK: No JVD; No carotid bruits CARDIAC: ***RRR, no murmurs, rubs, gallops RESPIRATORY:  Clear to auscultation without rales, wheezing or rhonchi  ABDOMEN: Soft, non-tender, non-distended EXTREMITIES:  No edema; No deformity   ASSESSMENT AND PLAN Palpitations    {Are you ordering a CV Procedure (e.g. stress test, cath, DCCV,  TEE, etc)?   Press F2        :[id number]}  Dispo: ***  Signed, Orren LOISE Fabry, PA-C

## 2024-03-26 ENCOUNTER — Ambulatory Visit: Attending: Physician Assistant | Admitting: Physician Assistant

## 2024-03-26 DIAGNOSIS — R002 Palpitations: Secondary | ICD-10-CM

## 2024-03-27 ENCOUNTER — Inpatient Hospital Stay (HOSPITAL_COMMUNITY): Admission: RE | Admit: 2024-03-27 | Source: Ambulatory Visit

## 2024-03-27 ENCOUNTER — Ambulatory Visit (HOSPITAL_COMMUNITY)

## 2024-03-29 ENCOUNTER — Ambulatory Visit (HOSPITAL_COMMUNITY)
Admission: RE | Admit: 2024-03-29 | Discharge: 2024-03-29 | Disposition: A | Discharge status: Home / Self Care | Source: Ambulatory Visit | Attending: Family Medicine | Admitting: Family Medicine

## 2024-03-29 ENCOUNTER — Ambulatory Visit (HOSPITAL_COMMUNITY): Admission: RE | Admit: 2024-03-29 | Discharge: 2024-03-29 | Attending: Family Medicine

## 2024-03-29 ENCOUNTER — Encounter (HOSPITAL_COMMUNITY): Payer: Self-pay

## 2024-03-29 ENCOUNTER — Other Ambulatory Visit (HOSPITAL_COMMUNITY)

## 2024-03-29 DIAGNOSIS — N644 Mastodynia: Secondary | ICD-10-CM | POA: Insufficient documentation

## 2024-03-29 DIAGNOSIS — N6310 Unspecified lump in the right breast, unspecified quadrant: Secondary | ICD-10-CM | POA: Diagnosis present
